# Patient Record
Sex: Female | Born: 1955 | Race: Asian | Hispanic: No | Marital: Married | Smoking: Never smoker
Health system: Southern US, Community
[De-identification: ages and names within clinical notes are randomized; demographics above are authoritative.]

## PROBLEM LIST (undated history)

## (undated) DIAGNOSIS — E785 Hyperlipidemia, unspecified: Secondary | ICD-10-CM

## (undated) DIAGNOSIS — K219 Gastro-esophageal reflux disease without esophagitis: Secondary | ICD-10-CM

## (undated) DIAGNOSIS — D259 Leiomyoma of uterus, unspecified: Secondary | ICD-10-CM

## (undated) DIAGNOSIS — Z860100 Personal history of colon polyps, unspecified: Secondary | ICD-10-CM

## (undated) DIAGNOSIS — K259 Gastric ulcer, unspecified as acute or chronic, without hemorrhage or perforation: Secondary | ICD-10-CM

## (undated) DIAGNOSIS — E041 Nontoxic single thyroid nodule: Secondary | ICD-10-CM

## (undated) DIAGNOSIS — I10 Essential (primary) hypertension: Secondary | ICD-10-CM

## (undated) DIAGNOSIS — M8580 Other specified disorders of bone density and structure, unspecified site: Secondary | ICD-10-CM

## (undated) DIAGNOSIS — F419 Anxiety disorder, unspecified: Secondary | ICD-10-CM

## (undated) DIAGNOSIS — I517 Cardiomegaly: Secondary | ICD-10-CM

## (undated) DIAGNOSIS — G4730 Sleep apnea, unspecified: Secondary | ICD-10-CM

## (undated) DIAGNOSIS — M810 Age-related osteoporosis without current pathological fracture: Secondary | ICD-10-CM

## (undated) DIAGNOSIS — B029 Zoster without complications: Secondary | ICD-10-CM

## (undated) DIAGNOSIS — K2270 Barrett's esophagus without dysplasia: Secondary | ICD-10-CM

## (undated) DIAGNOSIS — IMO0002 Reserved for concepts with insufficient information to code with codable children: Secondary | ICD-10-CM

## (undated) DIAGNOSIS — K8689 Other specified diseases of pancreas: Secondary | ICD-10-CM

## (undated) HISTORY — DX: Gastric ulcer, unspecified as acute or chronic, without hemorrhage or perforation: K25.9

## (undated) HISTORY — DX: Hyperlipidemia, unspecified: E78.5

## (undated) HISTORY — DX: Nontoxic single thyroid nodule: E04.1

## (undated) HISTORY — DX: Barrett's esophagus without dysplasia: K22.70

## (undated) HISTORY — DX: Cardiomegaly: I51.7

## (undated) HISTORY — DX: Age-related osteoporosis without current pathological fracture: M81.0

## (undated) HISTORY — DX: Reserved for concepts with insufficient information to code with codable children: IMO0002

## (undated) HISTORY — DX: Leiomyoma of uterus, unspecified: D25.9

## (undated) HISTORY — DX: Zoster without complications: B02.9

## (undated) HISTORY — DX: Other specified diseases of pancreas: K86.89

## (undated) HISTORY — PX: TUBAL LIGATION: SHX77

## (undated) HISTORY — DX: Essential (primary) hypertension: I10

## (undated) HISTORY — DX: Personal history of colonic polyps: Z86.010

## (undated) HISTORY — PX: COLONOSCOPY W/ POLYPECTOMY: SHX1380

## (undated) HISTORY — DX: Anxiety disorder, unspecified: F41.9

## (undated) HISTORY — DX: Other specified disorders of bone density and structure, unspecified site: M85.80

## (undated) HISTORY — DX: Personal history of colon polyps, unspecified: Z86.0100

## (undated) HISTORY — PX: BIOPSY THYROID: PRO38

## (undated) HISTORY — DX: Sleep apnea, unspecified: G47.30

## (undated) HISTORY — DX: Gastro-esophageal reflux disease without esophagitis: K21.9

---

## 2004-02-08 ENCOUNTER — Other Ambulatory Visit: Admission: RE | Admit: 2004-02-08 | Discharge: 2004-02-08 | Payer: Self-pay | Admitting: Family Medicine

## 2004-03-04 ENCOUNTER — Encounter: Admission: RE | Admit: 2004-03-04 | Discharge: 2004-03-04 | Payer: Self-pay | Admitting: Family Medicine

## 2004-03-04 ENCOUNTER — Encounter: Admission: RE | Admit: 2004-03-04 | Discharge: 2004-03-04 | Payer: Self-pay | Admitting: Internal Medicine

## 2005-03-13 ENCOUNTER — Encounter: Admission: RE | Admit: 2005-03-13 | Discharge: 2005-03-13 | Payer: Self-pay | Admitting: Family Medicine

## 2005-03-19 ENCOUNTER — Encounter: Payer: Self-pay | Admitting: Family Medicine

## 2005-03-19 ENCOUNTER — Other Ambulatory Visit: Admission: RE | Admit: 2005-03-19 | Discharge: 2005-03-19 | Payer: Self-pay | Admitting: Family Medicine

## 2005-03-19 ENCOUNTER — Ambulatory Visit: Payer: Self-pay | Admitting: Family Medicine

## 2005-03-23 ENCOUNTER — Ambulatory Visit: Payer: Self-pay | Admitting: Family Medicine

## 2006-01-20 ENCOUNTER — Ambulatory Visit: Payer: Self-pay | Admitting: Family Medicine

## 2006-03-15 ENCOUNTER — Encounter: Admission: RE | Admit: 2006-03-15 | Discharge: 2006-03-15 | Payer: Self-pay | Admitting: Family Medicine

## 2006-03-19 ENCOUNTER — Ambulatory Visit: Payer: Self-pay | Admitting: Family Medicine

## 2006-03-19 ENCOUNTER — Other Ambulatory Visit: Admission: RE | Admit: 2006-03-19 | Discharge: 2006-03-19 | Payer: Self-pay | Admitting: Family Medicine

## 2006-03-19 ENCOUNTER — Encounter: Payer: Self-pay | Admitting: Family Medicine

## 2006-03-30 ENCOUNTER — Ambulatory Visit: Payer: Self-pay | Admitting: Family Medicine

## 2007-03-21 ENCOUNTER — Encounter: Admission: RE | Admit: 2007-03-21 | Discharge: 2007-03-21 | Payer: Self-pay | Admitting: Family Medicine

## 2007-03-28 ENCOUNTER — Encounter (INDEPENDENT_AMBULATORY_CARE_PROVIDER_SITE_OTHER): Payer: Self-pay | Admitting: *Deleted

## 2007-05-24 ENCOUNTER — Ambulatory Visit: Payer: Self-pay | Admitting: Family Medicine

## 2007-05-24 ENCOUNTER — Encounter: Payer: Self-pay | Admitting: Family Medicine

## 2007-05-24 ENCOUNTER — Other Ambulatory Visit: Admission: RE | Admit: 2007-05-24 | Discharge: 2007-05-24 | Payer: Self-pay | Admitting: Family Medicine

## 2007-05-24 DIAGNOSIS — D259 Leiomyoma of uterus, unspecified: Secondary | ICD-10-CM | POA: Insufficient documentation

## 2007-05-24 DIAGNOSIS — B009 Herpesviral infection, unspecified: Secondary | ICD-10-CM | POA: Insufficient documentation

## 2007-05-24 DIAGNOSIS — R0789 Other chest pain: Secondary | ICD-10-CM | POA: Insufficient documentation

## 2007-05-24 DIAGNOSIS — M79609 Pain in unspecified limb: Secondary | ICD-10-CM | POA: Insufficient documentation

## 2007-05-30 ENCOUNTER — Encounter (INDEPENDENT_AMBULATORY_CARE_PROVIDER_SITE_OTHER): Payer: Self-pay | Admitting: *Deleted

## 2007-05-31 ENCOUNTER — Ambulatory Visit: Payer: Self-pay | Admitting: Family Medicine

## 2007-06-03 ENCOUNTER — Encounter (INDEPENDENT_AMBULATORY_CARE_PROVIDER_SITE_OTHER): Payer: Self-pay | Admitting: *Deleted

## 2007-06-03 LAB — CONVERTED CEMR LAB
ALT: 24 units/L (ref 0–35)
AST: 16 units/L (ref 0–37)
Albumin: 4 g/dL (ref 3.5–5.2)
Alkaline Phosphatase: 64 units/L (ref 39–117)
BUN: 13 mg/dL (ref 6–23)
Basophils Absolute: 0 10*3/uL (ref 0.0–0.1)
Basophils Relative: 0.7 % (ref 0.0–1.0)
Bilirubin, Direct: 0.1 mg/dL (ref 0.0–0.3)
CO2: 30 meq/L (ref 19–32)
Calcium: 9.4 mg/dL (ref 8.4–10.5)
Chloride: 104 meq/L (ref 96–112)
Cholesterol: 167 mg/dL (ref 0–200)
Creatinine, Ser: 0.6 mg/dL (ref 0.4–1.2)
Eosinophils Absolute: 0.2 10*3/uL (ref 0.0–0.6)
Eosinophils Relative: 3.6 % (ref 0.0–5.0)
GFR calc Af Amer: 135 mL/min
GFR calc non Af Amer: 112 mL/min
Glucose, Bld: 88 mg/dL (ref 70–99)
HCT: 39.1 % (ref 36.0–46.0)
HDL: 34.9 mg/dL — ABNORMAL LOW (ref >39.0)
Hemoglobin: 13.6 g/dL (ref 12.0–15.0)
LDL Cholesterol: 100 mg/dL — ABNORMAL HIGH (ref 0–99)
Lymphocytes Relative: 31.2 % (ref 12.0–46.0)
MCHC: 34.7 g/dL (ref 30.0–36.0)
MCV: 90.6 fL (ref 78.0–100.0)
Monocytes Absolute: 0.5 10*3/uL (ref 0.2–0.7)
Monocytes Relative: 7.9 % (ref 3.0–11.0)
Neutro Abs: 3.7 10*3/uL (ref 1.4–7.7)
Neutrophils Relative %: 56.6 % (ref 43.0–77.0)
Platelets: 351 10*3/uL (ref 150–400)
Potassium: 4.6 meq/L (ref 3.5–5.1)
RBC: 4.32 M/uL (ref 3.87–5.11)
RDW: 11.4 % — ABNORMAL LOW (ref 11.5–14.6)
Sodium: 142 meq/L (ref 135–145)
TSH: 2.11 microintl units/mL (ref 0.35–5.50)
Total Bilirubin: 0.9 mg/dL (ref 0.3–1.2)
Total CHOL/HDL Ratio: 4.8
Total Protein: 7.1 g/dL (ref 6.0–8.3)
Triglycerides: 162 mg/dL — ABNORMAL HIGH (ref 0–149)
VLDL: 32 mg/dL (ref 0–40)
WBC: 6.4 10*3/uL (ref 4.5–10.5)

## 2007-06-08 ENCOUNTER — Ambulatory Visit: Payer: Self-pay

## 2007-06-08 ENCOUNTER — Encounter: Payer: Self-pay | Admitting: Family Medicine

## 2007-06-15 ENCOUNTER — Encounter (INDEPENDENT_AMBULATORY_CARE_PROVIDER_SITE_OTHER): Payer: Self-pay | Admitting: *Deleted

## 2007-06-16 ENCOUNTER — Ambulatory Visit: Payer: Self-pay | Admitting: Gastroenterology

## 2008-03-21 ENCOUNTER — Encounter: Admission: RE | Admit: 2008-03-21 | Discharge: 2008-03-21 | Payer: Self-pay | Admitting: Family Medicine

## 2008-03-22 ENCOUNTER — Encounter (INDEPENDENT_AMBULATORY_CARE_PROVIDER_SITE_OTHER): Payer: Self-pay | Admitting: *Deleted

## 2008-05-21 ENCOUNTER — Ambulatory Visit: Payer: Self-pay | Admitting: Family Medicine

## 2008-05-21 ENCOUNTER — Encounter: Payer: Self-pay | Admitting: Family Medicine

## 2008-05-21 ENCOUNTER — Other Ambulatory Visit: Admission: RE | Admit: 2008-05-21 | Discharge: 2008-05-21 | Payer: Self-pay | Admitting: Family Medicine

## 2008-05-23 ENCOUNTER — Encounter (INDEPENDENT_AMBULATORY_CARE_PROVIDER_SITE_OTHER): Payer: Self-pay | Admitting: *Deleted

## 2008-05-29 ENCOUNTER — Encounter (INDEPENDENT_AMBULATORY_CARE_PROVIDER_SITE_OTHER): Payer: Self-pay | Admitting: *Deleted

## 2008-05-29 ENCOUNTER — Ambulatory Visit: Payer: Self-pay | Admitting: Family Medicine

## 2008-06-12 ENCOUNTER — Encounter: Payer: Self-pay | Admitting: Internal Medicine

## 2008-06-23 LAB — CONVERTED CEMR LAB
ALT: 23 units/L (ref 0–35)
AST: 18 units/L (ref 0–37)
Albumin: 3.9 g/dL (ref 3.5–5.2)
Alkaline Phosphatase: 63 units/L (ref 39–117)
BUN: 11 mg/dL (ref 6–23)
Basophils Absolute: 0 10*3/uL (ref 0.0–0.1)
Basophils Relative: 0.4 % (ref 0.0–3.0)
Bilirubin, Direct: 0.1 mg/dL (ref 0.0–0.3)
CO2: 31 meq/L (ref 19–32)
Calcium: 9.3 mg/dL (ref 8.4–10.5)
Chloride: 106 meq/L (ref 96–112)
Cholesterol: 171 mg/dL (ref 0–200)
Creatinine, Ser: 0.7 mg/dL (ref 0.4–1.2)
Direct LDL: 77.3 mg/dL
Eosinophils Absolute: 0.3 10*3/uL (ref 0.0–0.7)
Eosinophils Relative: 3.6 % (ref 0.0–5.0)
GFR calc Af Amer: 113 mL/min
GFR calc non Af Amer: 93 mL/min
Glucose, Bld: 103 mg/dL — ABNORMAL HIGH (ref 70–99)
HCT: 39.3 % (ref 36.0–46.0)
HDL: 30.8 mg/dL — ABNORMAL LOW (ref >39.0)
Hemoglobin: 13.7 g/dL (ref 12.0–15.0)
Lymphocytes Relative: 27.6 % (ref 12.0–46.0)
MCHC: 34.8 g/dL (ref 30.0–36.0)
MCV: 91.5 fL (ref 78.0–100.0)
Monocytes Absolute: 0.5 10*3/uL (ref 0.1–1.0)
Monocytes Relative: 7.3 % (ref 3.0–12.0)
Neutro Abs: 4.3 10*3/uL (ref 1.4–7.7)
Neutrophils Relative %: 61.1 % (ref 43.0–77.0)
Platelets: 256 10*3/uL (ref 150–400)
Potassium: 4 meq/L (ref 3.5–5.1)
RBC: 4.3 M/uL (ref 3.87–5.11)
RDW: 11.4 % — ABNORMAL LOW (ref 11.5–14.6)
Sodium: 142 meq/L (ref 135–145)
TSH: 1.67 microintl units/mL (ref 0.35–5.50)
Total Bilirubin: 0.6 mg/dL (ref 0.3–1.2)
Total CHOL/HDL Ratio: 5.6
Total Protein: 6.8 g/dL (ref 6.0–8.3)
Triglycerides: 202 mg/dL (ref 0–149)
VLDL: 40 mg/dL (ref 0–40)
WBC: 7.1 10*3/uL (ref 4.5–10.5)

## 2008-06-25 ENCOUNTER — Encounter (INDEPENDENT_AMBULATORY_CARE_PROVIDER_SITE_OTHER): Payer: Self-pay | Admitting: *Deleted

## 2008-12-19 ENCOUNTER — Ambulatory Visit: Payer: Self-pay | Admitting: Family Medicine

## 2008-12-19 DIAGNOSIS — R319 Hematuria, unspecified: Secondary | ICD-10-CM | POA: Insufficient documentation

## 2008-12-19 DIAGNOSIS — R109 Unspecified abdominal pain: Secondary | ICD-10-CM | POA: Insufficient documentation

## 2008-12-19 DIAGNOSIS — R1013 Epigastric pain: Secondary | ICD-10-CM | POA: Insufficient documentation

## 2008-12-19 LAB — CONVERTED CEMR LAB
Bilirubin Urine: NEGATIVE
Glucose, Urine, Semiquant: NEGATIVE
Ketones, urine, test strip: NEGATIVE
Nitrite: NEGATIVE
Specific Gravity, Urine: 1.01
Urobilinogen, UA: NEGATIVE
WBC Urine, dipstick: NEGATIVE
pH: 6

## 2008-12-28 LAB — CONVERTED CEMR LAB
ALT: 24 units/L (ref 0–35)
AST: 20 units/L (ref 0–37)
Albumin: 4 g/dL (ref 3.5–5.2)
Alkaline Phosphatase: 71 units/L (ref 39–117)
Amylase: 66 units/L (ref 27–131)
BUN: 15 mg/dL (ref 6–23)
Basophils Absolute: 0 10*3/uL (ref 0.0–0.1)
Basophils Relative: 0.5 % (ref 0.0–3.0)
Bilirubin, Direct: 0 mg/dL (ref 0.0–0.3)
CO2: 28 meq/L (ref 19–32)
Calcium: 9.1 mg/dL (ref 8.4–10.5)
Chloride: 102 meq/L (ref 96–112)
Creatinine, Ser: 0.8 mg/dL (ref 0.4–1.2)
Eosinophils Absolute: 0.2 10*3/uL (ref 0.0–0.7)
Eosinophils Relative: 3.4 % (ref 0.0–5.0)
GFR calc non Af Amer: 79.51 mL/min (ref >60)
Glucose, Bld: 106 mg/dL — ABNORMAL HIGH (ref 70–99)
H Pylori IgG: NEGATIVE
HCT: 39.3 % (ref 36.0–46.0)
Hemoglobin: 13.6 g/dL (ref 12.0–15.0)
Lipase: 39 units/L (ref 11.0–59.0)
Lymphocytes Relative: 24.3 % (ref 12.0–46.0)
Lymphs Abs: 1.7 10*3/uL (ref 0.7–4.0)
MCHC: 34.6 g/dL (ref 30.0–36.0)
MCV: 91.3 fL (ref 78.0–100.0)
Monocytes Absolute: 0.6 10*3/uL (ref 0.1–1.0)
Monocytes Relative: 8.7 % (ref 3.0–12.0)
Neutro Abs: 4.7 10*3/uL (ref 1.4–7.7)
Neutrophils Relative %: 63.1 % (ref 43.0–77.0)
Platelets: 263 10*3/uL (ref 150.0–400.0)
Potassium: 4.3 meq/L (ref 3.5–5.1)
RBC: 4.3 M/uL (ref 3.87–5.11)
RDW: 11.6 % (ref 11.5–14.6)
Sodium: 142 meq/L (ref 135–145)
TSH: 1.7 microintl units/mL (ref 0.35–5.50)
Total Bilirubin: 0.9 mg/dL (ref 0.3–1.2)
Total Protein: 7 g/dL (ref 6.0–8.3)
WBC: 7.2 10*3/uL (ref 4.5–10.5)

## 2008-12-31 ENCOUNTER — Encounter (INDEPENDENT_AMBULATORY_CARE_PROVIDER_SITE_OTHER): Payer: Self-pay | Admitting: *Deleted

## 2009-01-01 ENCOUNTER — Encounter: Admission: RE | Admit: 2009-01-01 | Discharge: 2009-01-01 | Payer: Self-pay | Admitting: Family Medicine

## 2009-01-11 ENCOUNTER — Telehealth: Payer: Self-pay | Admitting: Family Medicine

## 2009-03-22 ENCOUNTER — Encounter: Admission: RE | Admit: 2009-03-22 | Discharge: 2009-03-22 | Payer: Self-pay | Admitting: Family Medicine

## 2009-04-11 ENCOUNTER — Ambulatory Visit: Payer: Self-pay | Admitting: Family Medicine

## 2009-04-11 DIAGNOSIS — K219 Gastro-esophageal reflux disease without esophagitis: Secondary | ICD-10-CM | POA: Insufficient documentation

## 2009-04-12 ENCOUNTER — Encounter (INDEPENDENT_AMBULATORY_CARE_PROVIDER_SITE_OTHER): Payer: Self-pay | Admitting: *Deleted

## 2009-04-12 ENCOUNTER — Telehealth: Payer: Self-pay | Admitting: Family Medicine

## 2009-04-16 ENCOUNTER — Encounter: Payer: Self-pay | Admitting: Family Medicine

## 2009-04-16 LAB — CONVERTED CEMR LAB
ALT: 40 units/L — ABNORMAL HIGH (ref 0–35)
AST: 34 units/L (ref 0–37)
Albumin: 4.3 g/dL (ref 3.5–5.2)
Alkaline Phosphatase: 95 units/L (ref 39–117)
Amylase: 74 units/L (ref 27–131)
BUN: 11 mg/dL (ref 6–23)
Basophils Absolute: 0 10*3/uL (ref 0.0–0.1)
Basophils Relative: 0 % (ref 0.0–3.0)
Bilirubin, Direct: 0 mg/dL (ref 0.0–0.3)
CO2: 18 meq/L — ABNORMAL LOW (ref 19–32)
Calcium: 9.8 mg/dL (ref 8.4–10.5)
Chloride: 100 meq/L (ref 96–112)
Creatinine, Ser: 0.8 mg/dL (ref 0.4–1.2)
Eosinophils Absolute: 0.2 10*3/uL (ref 0.0–0.7)
Eosinophils Relative: 3.7 % (ref 0.0–5.0)
GFR calc non Af Amer: 79.41 mL/min (ref >60)
Glucose, Bld: 107 mg/dL — ABNORMAL HIGH (ref 70–99)
H Pylori IgG: NEGATIVE
HCT: 41.4 % (ref 36.0–46.0)
Hemoglobin: 14.2 g/dL (ref 12.0–15.0)
Lipase: 38 units/L (ref 11.0–59.0)
Lymphocytes Relative: 37.2 % (ref 12.0–46.0)
Lymphs Abs: 2.3 10*3/uL (ref 0.7–4.0)
MCHC: 34.2 g/dL (ref 30.0–36.0)
MCV: 89.7 fL (ref 78.0–100.0)
Monocytes Absolute: 0.3 10*3/uL (ref 0.1–1.0)
Monocytes Relative: 5.2 % (ref 3.0–12.0)
Neutro Abs: 3.5 10*3/uL (ref 1.4–7.7)
Neutrophils Relative %: 53.9 % (ref 43.0–77.0)
Platelets: 280 10*3/uL (ref 150.0–400.0)
Potassium: 4.5 meq/L (ref 3.5–5.1)
RBC: 4.62 M/uL (ref 3.87–5.11)
RDW: 12 % (ref 11.5–14.6)
Sodium: 135 meq/L (ref 135–145)
Total Bilirubin: 0.5 mg/dL (ref 0.3–1.2)
Total Protein: 6.2 g/dL (ref 6.0–8.3)
WBC: 6.3 10*3/uL (ref 4.5–10.5)

## 2009-04-18 ENCOUNTER — Ambulatory Visit: Payer: Self-pay | Admitting: Gastroenterology

## 2009-04-18 DIAGNOSIS — M129 Arthropathy, unspecified: Secondary | ICD-10-CM | POA: Insufficient documentation

## 2009-04-18 DIAGNOSIS — F450 Somatization disorder: Secondary | ICD-10-CM | POA: Insufficient documentation

## 2009-04-18 DIAGNOSIS — R1084 Generalized abdominal pain: Secondary | ICD-10-CM | POA: Insufficient documentation

## 2009-04-18 DIAGNOSIS — E042 Nontoxic multinodular goiter: Secondary | ICD-10-CM | POA: Insufficient documentation

## 2009-04-19 LAB — CONVERTED CEMR LAB
ALT: 27 units/L (ref 0–35)
AST: 17 units/L (ref 0–37)
Albumin: 4.1 g/dL (ref 3.5–5.2)
Alkaline Phosphatase: 83 units/L (ref 39–117)
Amylase: 72 units/L (ref 27–131)
BUN: 16 mg/dL (ref 6–23)
Basophils Absolute: 0.1 10*3/uL (ref 0.0–0.1)
Basophils Relative: 0.9 % (ref 0.0–3.0)
Bilirubin, Direct: 0 mg/dL (ref 0.0–0.3)
CO2: 29 meq/L (ref 19–32)
Calcium: 9 mg/dL (ref 8.4–10.5)
Chloride: 103 meq/L (ref 96–112)
Creatinine, Ser: 0.6 mg/dL (ref 0.4–1.2)
Eosinophils Absolute: 0.2 10*3/uL (ref 0.0–0.7)
Eosinophils Relative: 3.1 % (ref 0.0–5.0)
Ferritin: 97 ng/mL (ref 10.0–291.0)
Folate: 10.8 ng/mL
GFR calc non Af Amer: 110.68 mL/min (ref >60)
Glucose, Bld: 104 mg/dL — ABNORMAL HIGH (ref 70–99)
H Pylori IgG: NEGATIVE
HCT: 39.5 % (ref 36.0–46.0)
Hemoglobin: 13.5 g/dL (ref 12.0–15.0)
Iron: 72 ug/dL (ref 42–145)
Lipase: 31 units/L (ref 11.0–59.0)
Lymphocytes Relative: 29 % (ref 12.0–46.0)
Lymphs Abs: 2 10*3/uL (ref 0.7–4.0)
MCHC: 34.2 g/dL (ref 30.0–36.0)
MCV: 90.6 fL (ref 78.0–100.0)
Monocytes Absolute: 0.5 10*3/uL (ref 0.1–1.0)
Monocytes Relative: 6.7 % (ref 3.0–12.0)
Neutro Abs: 4.1 10*3/uL (ref 1.4–7.7)
Neutrophils Relative %: 60.3 % (ref 43.0–77.0)
Platelets: 257 10*3/uL (ref 150.0–400.0)
Potassium: 3.8 meq/L (ref 3.5–5.1)
RBC: 4.36 M/uL (ref 3.87–5.11)
RDW: 11.8 % (ref 11.5–14.6)
Saturation Ratios: 22.8 % (ref 20.0–50.0)
Sed Rate: 5 mm/hr (ref 0–22)
Sodium: 141 meq/L (ref 135–145)
T3 Uptake Ratio: 41 % — ABNORMAL HIGH (ref 22.5–37.0)
T4, Total: 8.9 ug/dL (ref 5.0–12.5)
TSH: 1.25 microintl units/mL (ref 0.35–5.50)
Total Bilirubin: 0.7 mg/dL (ref 0.3–1.2)
Total Protein: 6.9 g/dL (ref 6.0–8.3)
Transferrin: 225.4 mg/dL (ref 212.0–360.0)
Vitamin B-12: 447 pg/mL (ref 211–911)
WBC: 6.9 10*3/uL (ref 4.5–10.5)

## 2009-04-25 ENCOUNTER — Encounter: Admission: RE | Admit: 2009-04-25 | Discharge: 2009-04-25 | Payer: Self-pay | Admitting: Family Medicine

## 2009-04-26 ENCOUNTER — Telehealth: Payer: Self-pay | Admitting: Family Medicine

## 2009-04-26 ENCOUNTER — Telehealth (INDEPENDENT_AMBULATORY_CARE_PROVIDER_SITE_OTHER): Payer: Self-pay | Admitting: *Deleted

## 2009-04-29 ENCOUNTER — Telehealth: Payer: Self-pay | Admitting: Family Medicine

## 2009-06-11 ENCOUNTER — Other Ambulatory Visit: Admission: RE | Admit: 2009-06-11 | Discharge: 2009-06-11 | Payer: Self-pay | Admitting: Interventional Radiology

## 2009-06-11 ENCOUNTER — Encounter: Payer: Self-pay | Admitting: Family Medicine

## 2009-06-11 ENCOUNTER — Encounter: Admission: RE | Admit: 2009-06-11 | Discharge: 2009-06-11 | Payer: Self-pay | Admitting: Surgery

## 2010-04-23 ENCOUNTER — Encounter: Admission: RE | Admit: 2010-04-23 | Discharge: 2010-04-23 | Payer: Self-pay | Admitting: Internal Medicine

## 2010-07-26 ENCOUNTER — Encounter: Payer: Self-pay | Admitting: Family Medicine

## 2010-07-27 ENCOUNTER — Encounter: Payer: Self-pay | Admitting: Family Medicine

## 2010-08-03 LAB — CONVERTED CEMR LAB
Bilirubin Urine: NEGATIVE
Glucose, Urine, Semiquant: NEGATIVE
Ketones, urine, test strip: NEGATIVE
Nitrite: NEGATIVE
Pap Smear: NORMAL
Protein, U semiquant: NEGATIVE
Specific Gravity, Urine: 1.02
Urobilinogen, UA: NEGATIVE
WBC Urine, dipstick: NEGATIVE
pH: 6

## 2011-05-07 ENCOUNTER — Other Ambulatory Visit: Payer: Self-pay | Admitting: Internal Medicine

## 2011-05-07 DIAGNOSIS — Z1231 Encounter for screening mammogram for malignant neoplasm of breast: Secondary | ICD-10-CM

## 2011-06-03 ENCOUNTER — Ambulatory Visit
Admission: RE | Admit: 2011-06-03 | Discharge: 2011-06-03 | Disposition: A | Discharge status: Home / Self Care | Payer: BC Managed Care – PPO | Source: Ambulatory Visit | Attending: Internal Medicine | Admitting: Internal Medicine

## 2011-06-03 DIAGNOSIS — Z1231 Encounter for screening mammogram for malignant neoplasm of breast: Secondary | ICD-10-CM

## 2014-05-02 ENCOUNTER — Telehealth: Payer: Self-pay | Admitting: Gynecology

## 2014-05-02 NOTE — Telephone Encounter (Signed)
LMTCB about canceled appointment with Dr. Charlies Constable. I will close the encounter.

## 2014-05-21 ENCOUNTER — Encounter: Payer: Self-pay | Admitting: Gynecology

## 2014-06-14 ENCOUNTER — Encounter: Payer: Self-pay | Admitting: Certified Nurse Midwife

## 2014-06-14 ENCOUNTER — Ambulatory Visit (INDEPENDENT_AMBULATORY_CARE_PROVIDER_SITE_OTHER): Payer: BC Managed Care – PPO | Admitting: Certified Nurse Midwife

## 2014-06-14 VITALS — BP 110/80 | HR 72 | Resp 16 | Ht 60.25 in | Wt 143.0 lb

## 2014-06-14 DIAGNOSIS — N852 Hypertrophy of uterus: Secondary | ICD-10-CM

## 2014-06-14 DIAGNOSIS — D259 Leiomyoma of uterus, unspecified: Secondary | ICD-10-CM

## 2014-06-14 NOTE — Patient Instructions (Signed)
Uterine Fibroid A uterine fibroid is a growth (tumor) in your uterus. It is not cancerous. Some fibroids cause pain or heavy bleeding during and in between periods. Moore care depends on how you were treated. In general:  Keep all doctor visits.  Only take medicine as told by your doctor. Do not take aspirin.  Talk to your doctor about taking iron pills.  If you have painful periods that are not heavy, lie down with your feet up. Put cold packs on your belly (abdomen).  If you have heavy periods, tell your doctor how many pads or tampons you use in a month.  Eat green vegetables. GET HELP RIGHT AWAY IF:   You have lower belly (pelvic) pain or cramps not helped by medicine.  You have sudden lower belly pain that gets worse.  You have more bleeding between and during your periods.  If you soak your tampon or pad in 30 minutes or less.  You feel lightheaded or faint. MAKE SURE YOU:   Understand these instructions.  Will watch your condition.  Will get help right away if you are not doing well or get worse. Document Released: 09/18/2008 Document Revised: 04/12/2013 Document Reviewed: 01/19/2013 [facility] Patient Information 2015 [place], [place]. This information is not intended to replace advice given to you by your health care provider. Make sure you discuss any questions you have with your health care provider.

## 2014-06-14 NOTE — Progress Notes (Signed)
58 y.o. G2P2003 Married Asian [place] new patient, here for consultation and pelvic exam  regarding uterus enlargement and history of fibroids.  Was seen at [facility] with enlarged uterus and  was told she needed further evaluation. No PUS report here with records. Patient says she had one done in 11/15. Menopausal no vaginal bleeding or vaginal dryness. Using Premarin cream for vaginal dryness every night. Initially and then sporadic to none. Patient denies pelvic pain, urinary symptoms or other health issues. Patient "just wants to make sure enlarged uterus is not a problem." PUS from 2010 in epic identified with uterus size 18.4x 11.5.x 9.5 cm. Multiple fibroids were noted wit the largest being 10.4 x 9.2 x 7.6 cm with submucosal component. Normal appearing ovaries transabdominal. Patient does not feel anything has changed. Patient is having pain with sexual activity with dryness, no bleeding. No other concerns.   Patient's last menstrual period was 07/06/2008.          Sexually active: Yes.    The current method of family planning is tubal ligation.    Exercising: No.  exercise Smoker:  no  Health Maintenance: Pap:  2015 negative per patient MMG:  2013 per patient Colonoscopy:  2013 BMD:   2013 TDaP:  Will check with pcp ws due 2005, but patient feels UTD Labs: none Self breast exam: done monthly   reports that she has never smoked. She does not have any smokeless tobacco history on file. She reports that she does not drink alcohol or use illicit drugs.  Past Medical History  Diagnosis Date  . Uterine leiomyoma   . Sleep apnea   . Gastric ulcer   . Barrett's esophagus   . Cardiomegaly   . Hyperlipidemia   . Hypertension   . Hx of colonic polyps   . Thyroid nodule   . Osteopenia   . Anxiety   . Dyspareunia   . Shingles   . GERD (gastroesophageal reflux disease)   . Fatty pancreas     Past Surgical History  Procedure Laterality Date  . Colonoscopy w/ polypectomy    .  Biopsy thyroid    . Tubal ligation      Current Outpatient Prescriptions  Medication Sig Dispense Refill  . CRESTOR 20 MG tablet Take 20 mg by mouth at bedtime.  12  . losartan (COZAAR) 25 MG tablet Take 25 mg by mouth daily.  12   No current facility-administered medications for this visit.    History reviewed. No pertinent family history.  ROS:  Pertinent items are noted in HPI.  Otherwise, a comprehensive ROS was negative.  Exam:   BP 110/80 mmHg  Pulse 72  Resp 16  Ht 5' 0.25" (1.53 m)  Wt 143 lb (64.864 kg)  BMI 27.71 kg/m2  LMP 07/06/2008 Height: 5' 0.25" (153 cm)  Ht Readings from Last 3 Encounters:  06/14/14 5' 0.25" (1.53 m)  04/18/09 5\' 1"  (1.549 m)  12/19/08 5\' 1"  (1.549 m)    General appearance: alert, cooperative and appears stated age, communicates well with good understanding. Head: Normocephalic, without obvious abnormality, atraumatic Abdomen: soft, non-tender;mass noted on right firm Skin: Skin color, texture, turgor normal. No rashes or lesions Lymph nodes:  normal. No abnormal inguinal nodes palpated   Pelvic: External genitalia:  no lesions              Urethra:  normal appearing urethra with no masses, tenderness or lesions  Bartholin's and Skene's: normal                 Vagina: normal appearing vagina with normal color and scant moisture, no lesions              Cervix:slightly firm mid position, normal appearance              Bimanual Exam:  Uterus:  enlarged, 18- 20  weeks size and firm enlarged more on right than left, non tender              Adnexa: not palpable adnexa, mass noted on left and firm, probable fibroid non tender               Rectovaginal: Confirms               Anus:  normal appearance  A:  Enlarged uterus history of fibroids per patient and 2010 PUS, current PUS not available, record requested  Vaginal dryness with sporadic use of Premarin cream, patient prefers no hormone use  Current aex with pap per patient,  not noted in records here. P:   Reviewed with patient abdominal and pelvic exam findings of enlarged uterus consistent with her previous history.? Questionable size change, will assess when record obtained. Discussed symptoms that may indicate with urination or pressure and patient denies these. Discussed fibroids can decrease in size in menopause. Discussed importance of advising if vaginal bleeding. Patient may change care to here, but not sure. If no PUS obtained would need to repeat for assessment, patient agreeable. Discussed Olive oil or coconut oil use for vaginal dryness at hs and with sexual activity. Questions addressed.  Rv as above. An After Visit Summary was printed and given to the patient.

## 2014-06-15 NOTE — Progress Notes (Addendum)
If current ultrasound shows stability in size, agree with continued monitoring as long as pt comfortable.  Reviewed personally.  Patty Emory, MD.  Addendum: Record of US shows from [facility] enlarged uterus but no conclusion. Patient will be notified and PUS scheduled here for evaluation of enlarged uterus on exam.

## 2014-06-19 ENCOUNTER — Telehealth: Payer: Self-pay

## 2014-06-19 DIAGNOSIS — N852 Hypertrophy of uterus: Secondary | ICD-10-CM

## 2014-06-19 NOTE — Addendum Note (Signed)
Addended by: Regina Eck on: 06/19/2014 08:47 AM   Modules accepted: Orders, SmartSet

## 2014-06-19 NOTE — Telephone Encounter (Signed)
Spoke with patient. Advised patient Patty [name] CNM has received radiology report from [facility] and recommends that she be seen in our office for PUS and consult with MD due to enlargement of uterus. Patient states that she is at the gym and would like to call back to discuss. Radiology report from [facility] sent to scan.

## 2014-06-27 NOTE — Telephone Encounter (Signed)
Left message to call Patty [name] at [phone]. 

## 2014-07-02 NOTE — Telephone Encounter (Signed)
Spoke with patient. Advised patient of message from [place]. Patient is agreeable to have PUS done in our office but would like to make sure it is covered with insurance before scheduling. Advised patient our insurance and billing department will have to check this and will return call with coverage information. Will schedule appointment from there. Patient is agreeable.  Cc: Felipa Emory

## 2014-07-03 NOTE — Telephone Encounter (Signed)
Per [facility] w/ [employer] 2016 benefit package is not yet loaded

## 2014-07-13 NOTE — Telephone Encounter (Signed)
Spoke with patient. Advised of OOP cost for ultrasound being $25 per note seen below from [place]. Patient would like to schedule at this time. Appointment scheduled for 1/14 at 2:30pm with 3pm consult with Dr.Miller. Patient is agreeable to date and time.  Cc: Dr.Miller  Routing to provider for final review. Patient agreeable to disposition. Will close encounter

## 2014-07-13 NOTE — Telephone Encounter (Signed)
Pr $25

## 2014-07-19 ENCOUNTER — Other Ambulatory Visit: Payer: Self-pay | Admitting: Obstetrics & Gynecology

## 2014-07-19 ENCOUNTER — Other Ambulatory Visit: Payer: Self-pay

## 2014-07-19 ENCOUNTER — Telehealth: Payer: Self-pay | Admitting: Obstetrics & Gynecology

## 2014-07-19 NOTE — Telephone Encounter (Signed)
Called patient and left messages at both numbers cancelling her PUS for today due to the sonographer having a death in the family. Please call her to reschedule.

## 2014-07-20 NOTE — Telephone Encounter (Signed)
Spoke with patient. Rescheduled PUS to 01.28.2016.

## 2014-08-02 ENCOUNTER — Ambulatory Visit (INDEPENDENT_AMBULATORY_CARE_PROVIDER_SITE_OTHER): Payer: BLUE CROSS/BLUE SHIELD | Admitting: Obstetrics & Gynecology

## 2014-08-02 ENCOUNTER — Encounter: Payer: Self-pay | Admitting: Obstetrics & Gynecology

## 2014-08-02 ENCOUNTER — Ambulatory Visit (INDEPENDENT_AMBULATORY_CARE_PROVIDER_SITE_OTHER): Payer: BLUE CROSS/BLUE SHIELD

## 2014-08-02 VITALS — BP 122/84 | Ht 60.25 in | Wt 146.0 lb

## 2014-08-02 DIAGNOSIS — D250 Submucous leiomyoma of uterus: Secondary | ICD-10-CM

## 2014-08-02 DIAGNOSIS — D251 Intramural leiomyoma of uterus: Secondary | ICD-10-CM

## 2014-08-02 DIAGNOSIS — N852 Hypertrophy of uterus: Secondary | ICD-10-CM

## 2014-08-02 NOTE — Progress Notes (Signed)
59 y.o. Patty [name] here for a pelvic ultrasound due to fibroids noted on physical exam from PCP, Dr. Glendon Axe at [facility].  Pt denies vaginal bleeding.  Has known for years that she had fibroids.  Was told in [place] years ago if she wasn't having heavy bleeding, she didn't need a hysterectomy.  Denies pelvic pain, rectal pressure, bladder pressure.  Review of medical records shows a prior ultrasound in 2010.  Comparison made to measurements today and this was all reviewed with pt.   Patient's last menstrual period was 07/06/2008.  Sexually active:  yes  Contraception: bilateral tubal ligation  FINDINGS: UTERUS:  9.0 x 8.2 x 7.8cm with multiple fibroids.  Largest 7.7 cm.  Other fibroids measure 3.0, 2.3, and 4.0cm EMS: 3.7mm ADNEXA:   Left ovary 2.1 x 2.1 x 1.5cm   Right ovary 2.2 x 1.4 x 0.8cm CUL DE SAC: no free fluid noted.  Comparison made to prior u/s obtained 01/01/09 which pt reports was performed due to large uterus and fibroids.  Images and report reviewed.  Uterus was 18 x 11 x 9.5cm with 10.4cm fibroid and then at least two additional fibroids 4.5 and 1.8cm.   Endometrial biopsy recommended.  Discussed with patient.  Verbal and written consent obtained.    Uterus and fibroids are definitely small now.  Given pt is otherwise asymptomatic, I do not recommend surgical removal.  Pt understands and any change in symptoms, specifically if she starts to feel pressure or has vaginal bleeding, reevaluation is appropriate.  Pt voices understanding.  All questions answered.  Assessment:  Enlarged fibroid uterus that is decreased in size since 2010  Plan:  Pt will follow-up prn or is welcome to continue annual gynecologic exams here.  Discouraged estrogen use as may make fibroids change in size.   CC: Dr. Glendon Axe  ~15 minutes spent with patient >50% of time was in face to face discussion of above.

## 2015-01-14 ENCOUNTER — Other Ambulatory Visit: Payer: Self-pay

## 2015-01-14 DIAGNOSIS — Z1231 Encounter for screening mammogram for malignant neoplasm of breast: Secondary | ICD-10-CM

## 2015-01-25 ENCOUNTER — Ambulatory Visit
Admission: RE | Admit: 2015-01-25 | Discharge: 2015-01-25 | Disposition: A | Discharge status: Home / Self Care | Payer: BLUE CROSS/BLUE SHIELD | Source: Ambulatory Visit

## 2015-01-25 DIAGNOSIS — Z1231 Encounter for screening mammogram for malignant neoplasm of breast: Secondary | ICD-10-CM

## 2015-01-29 ENCOUNTER — Other Ambulatory Visit: Payer: Self-pay | Admitting: Family Medicine

## 2015-01-29 DIAGNOSIS — R928 Other abnormal and inconclusive findings on diagnostic imaging of breast: Secondary | ICD-10-CM

## 2015-02-01 ENCOUNTER — Ambulatory Visit
Admission: RE | Admit: 2015-02-01 | Discharge: 2015-02-01 | Disposition: A | Discharge status: Home / Self Care | Payer: BLUE CROSS/BLUE SHIELD | Source: Ambulatory Visit | Attending: Family Medicine | Admitting: Family Medicine

## 2015-02-01 DIAGNOSIS — R928 Other abnormal and inconclusive findings on diagnostic imaging of breast: Secondary | ICD-10-CM

## 2020-03-05 ENCOUNTER — Inpatient Hospital Stay (HOSPITAL_BASED_OUTPATIENT_CLINIC_OR_DEPARTMENT_OTHER)
Admission: EM | Admit: 2020-03-05 | Discharge: 2020-03-08 | DRG: 177 | Disposition: A | Discharge status: Home Health | Payer: Medicare Other | Attending: Internal Medicine | Admitting: Internal Medicine

## 2020-03-05 ENCOUNTER — Other Ambulatory Visit: Payer: Self-pay

## 2020-03-05 ENCOUNTER — Encounter (HOSPITAL_BASED_OUTPATIENT_CLINIC_OR_DEPARTMENT_OTHER): Payer: Self-pay | Admitting: *Deleted

## 2020-03-05 ENCOUNTER — Emergency Department (HOSPITAL_BASED_OUTPATIENT_CLINIC_OR_DEPARTMENT_OTHER): Payer: Medicare Other

## 2020-03-05 ENCOUNTER — Encounter (HOSPITAL_BASED_OUTPATIENT_CLINIC_OR_DEPARTMENT_OTHER): Payer: Self-pay

## 2020-03-05 ENCOUNTER — Emergency Department (HOSPITAL_BASED_OUTPATIENT_CLINIC_OR_DEPARTMENT_OTHER)
Admission: EM | Admit: 2020-03-05 | Discharge: 2020-03-05 | Discharge status: Against Medical Advice | Payer: Medicare Other | Source: Home / Self Care

## 2020-03-05 DIAGNOSIS — R05 Cough: Secondary | ICD-10-CM | POA: Insufficient documentation

## 2020-03-05 DIAGNOSIS — R197 Diarrhea, unspecified: Secondary | ICD-10-CM | POA: Diagnosis present

## 2020-03-05 DIAGNOSIS — K2270 Barrett's esophagus without dysplasia: Secondary | ICD-10-CM | POA: Diagnosis present

## 2020-03-05 DIAGNOSIS — K219 Gastro-esophageal reflux disease without esophagitis: Secondary | ICD-10-CM | POA: Diagnosis present

## 2020-03-05 DIAGNOSIS — Z9981 Dependence on supplemental oxygen: Secondary | ICD-10-CM

## 2020-03-05 DIAGNOSIS — I10 Essential (primary) hypertension: Secondary | ICD-10-CM | POA: Diagnosis present

## 2020-03-05 DIAGNOSIS — Z8249 Family history of ischemic heart disease and other diseases of the circulatory system: Secondary | ICD-10-CM

## 2020-03-05 DIAGNOSIS — M810 Age-related osteoporosis without current pathological fracture: Secondary | ICD-10-CM | POA: Diagnosis present

## 2020-03-05 DIAGNOSIS — F419 Anxiety disorder, unspecified: Secondary | ICD-10-CM | POA: Diagnosis present

## 2020-03-05 DIAGNOSIS — G4733 Obstructive sleep apnea (adult) (pediatric): Secondary | ICD-10-CM | POA: Diagnosis present

## 2020-03-05 DIAGNOSIS — J1282 Pneumonia due to coronavirus disease 2019: Secondary | ICD-10-CM | POA: Diagnosis present

## 2020-03-05 DIAGNOSIS — R0602 Shortness of breath: Secondary | ICD-10-CM | POA: Insufficient documentation

## 2020-03-05 DIAGNOSIS — M1990 Unspecified osteoarthritis, unspecified site: Secondary | ICD-10-CM | POA: Diagnosis present

## 2020-03-05 DIAGNOSIS — J9601 Acute respiratory failure with hypoxia: Secondary | ICD-10-CM | POA: Diagnosis present

## 2020-03-05 DIAGNOSIS — D696 Thrombocytopenia, unspecified: Secondary | ICD-10-CM | POA: Diagnosis present

## 2020-03-05 DIAGNOSIS — U071 COVID-19: Secondary | ICD-10-CM | POA: Diagnosis not present

## 2020-03-05 DIAGNOSIS — Z5321 Procedure and treatment not carried out due to patient leaving prior to being seen by health care provider: Secondary | ICD-10-CM | POA: Insufficient documentation

## 2020-03-05 DIAGNOSIS — J9600 Acute respiratory failure, unspecified whether with hypoxia or hypercapnia: Secondary | ICD-10-CM | POA: Diagnosis present

## 2020-03-05 DIAGNOSIS — R109 Unspecified abdominal pain: Secondary | ICD-10-CM | POA: Insufficient documentation

## 2020-03-05 DIAGNOSIS — E785 Hyperlipidemia, unspecified: Secondary | ICD-10-CM | POA: Diagnosis present

## 2020-03-05 DIAGNOSIS — R42 Dizziness and giddiness: Secondary | ICD-10-CM | POA: Diagnosis present

## 2020-03-05 LAB — COMPREHENSIVE METABOLIC PANEL
ALT: 35 U/L (ref 0–44)
AST: 39 U/L (ref 15–41)
Albumin: 3.6 g/dL (ref 3.5–5.0)
Alkaline Phosphatase: 100 U/L (ref 38–126)
Anion gap: 12 (ref 5–15)
BUN: 10 mg/dL (ref 8–23)
CO2: 24 mmol/L (ref 22–32)
Calcium: 8.8 mg/dL — ABNORMAL LOW (ref 8.9–10.3)
Chloride: 99 mmol/L (ref 98–111)
Creatinine, Ser: 0.78 mg/dL (ref 0.44–1.00)
GFR calc Af Amer: >60 mL/min (ref >60)
GFR calc non Af Amer: >60 mL/min (ref >60)
Glucose, Bld: 137 mg/dL — ABNORMAL HIGH (ref 70–99)
Potassium: 3.7 mmol/L (ref 3.5–5.1)
Sodium: 135 mmol/L (ref 135–145)
Total Bilirubin: 0.6 mg/dL (ref 0.3–1.2)
Total Protein: 6.7 g/dL (ref 6.5–8.1)

## 2020-03-05 LAB — CBC WITH DIFFERENTIAL/PLATELET
Abs Immature Granulocytes: 0.02 10*3/uL (ref 0.00–0.07)
Basophils Absolute: 0 10*3/uL (ref 0.0–0.1)
Basophils Relative: 0 %
Eosinophils Absolute: 0 10*3/uL (ref 0.0–0.5)
Eosinophils Relative: 0 %
HCT: 40.2 % (ref 36.0–46.0)
Hemoglobin: 13.3 g/dL (ref 12.0–15.0)
Immature Granulocytes: 0 %
Lymphocytes Relative: 15 %
Lymphs Abs: 0.9 10*3/uL (ref 0.7–4.0)
MCH: 29.3 pg (ref 26.0–34.0)
MCHC: 33.1 g/dL (ref 30.0–36.0)
MCV: 88.5 fL (ref 80.0–100.0)
Monocytes Absolute: 0.3 10*3/uL (ref 0.1–1.0)
Monocytes Relative: 5 %
Neutro Abs: 4.5 10*3/uL (ref 1.7–7.7)
Neutrophils Relative %: 80 %
Platelets: 121 10*3/uL — ABNORMAL LOW (ref 150–400)
RBC: 4.54 MIL/uL (ref 3.87–5.11)
RDW: 12.5 % (ref 11.5–15.5)
WBC: 5.7 10*3/uL (ref 4.0–10.5)
nRBC: 0 % (ref 0.0–0.2)

## 2020-03-05 LAB — D-DIMER, QUANTITATIVE: D-Dimer, Quant: 0.43 ug/mL-FEU (ref 0.00–0.50)

## 2020-03-05 LAB — LACTIC ACID, PLASMA: Lactic Acid, Venous: 1.1 mmol/L (ref 0.5–1.9)

## 2020-03-05 LAB — C-REACTIVE PROTEIN: CRP: 10.7 mg/dL — ABNORMAL HIGH (ref <1.0)

## 2020-03-05 LAB — TRIGLYCERIDES: Triglycerides: 128 mg/dL (ref <150)

## 2020-03-05 LAB — LACTATE DEHYDROGENASE: LDH: 340 U/L — ABNORMAL HIGH (ref 98–192)

## 2020-03-05 LAB — FERRITIN: Ferritin: 703 ng/mL — ABNORMAL HIGH (ref 11–307)

## 2020-03-05 LAB — FIBRINOGEN: Fibrinogen: 624 mg/dL — ABNORMAL HIGH (ref 210–475)

## 2020-03-05 MED ORDER — SODIUM CHLORIDE 0.9 % IV SOLN
100.0000 mg | INTRAVENOUS | Status: AC
  Administered 2020-03-05 (×2): 100 mg via INTRAVENOUS
  Filled 2020-03-05 (×2): qty 20

## 2020-03-05 MED ORDER — ACETAMINOPHEN 325 MG PO TABS
650.0000 mg | ORAL_TABLET | Freq: Once | ORAL | Status: AC
  Administered 2020-03-05: 650 mg via ORAL
  Filled 2020-03-05: qty 2

## 2020-03-05 MED ORDER — METHYLPREDNISOLONE SODIUM SUCC 125 MG IJ SOLR
60.0000 mg | Freq: Once | INTRAMUSCULAR | Status: AC
  Administered 2020-03-05: 60 mg via INTRAVENOUS
  Filled 2020-03-05: qty 2

## 2020-03-05 MED ORDER — SODIUM CHLORIDE 0.9 % IV SOLN
100.0000 mg | Freq: Every day | INTRAVENOUS | Status: DC
  Administered 2020-03-06 – 2020-03-08 (×3): 100 mg via INTRAVENOUS
  Filled 2020-03-05 (×3): qty 20

## 2020-03-05 NOTE — ED Triage Notes (Signed)
Pt dx with covid 8/27, cont with cough and sob, triaged to ed earlier, states she got cold so she went home to get warm. Denies any new c/o states "I got warmed up so I came back."

## 2020-03-05 NOTE — ED Triage Notes (Signed)
Pt c/o cont'd cough, SOB x 2 days-states she had +covid 8/27-states she did get covid vaccine-NAD-steady gait

## 2020-03-05 NOTE — ED Provider Notes (Signed)
[facility] EMERGENCY DEPARTMENT Provider Note   CSN: [id number] Arrival date & time: 03/05/20  1715    History Chief Complaint  Patient presents with  . Cough    Patty [name] is a 64 y.o. female  Sx x 1 week, day 7 illness  Diarrhea today, no melena or BRPPR, No abd pain Cough non productive, SOB with exertion, increased WOB with speaking, No CP, palpitations, No unilateral leg swelling redness or warmth. Fever at home temp max 103.5 orally. No hx of PE, DVT, clotting disorders  Tolerating PO intake however decreased appetite, some nasuea  Taking Tylenol, Ibuprofen Loss of taste  J&J vaccine in April  Exposed by Positive  husband positive 2 weeks ago  Positive COVID 02/28/20, received e-mail yesterday from [facility] See picture in chart for documentation  History obtained from patient and past medical records.  No interpreter is used.  HPI     Past Medical History:  Diagnosis Date  . Anxiety   . Barrett's esophagus   . Cardiomegaly   . Dyspareunia   . Fatty pancreas   . Gastric ulcer   . GERD (gastroesophageal reflux disease)   . Hx of colonic polyps   . Hyperlipidemia   . Hypertension   . Osteopenia   . Osteoporosis   . Shingles   . Sleep apnea   . Thyroid nodule   . Uterine leiomyoma     Patient Active Problem List   Diagnosis Date Noted  . Acute respiratory failure due to COVID-19 ([place]) 03/05/2020  . 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 ARTHRITIS 04/18/2009  . ABDOMINAL PAIN, GENERALIZED 04/18/2009  . GERD 04/11/2009  . HEMATURIA UNSPECIFIED 12/19/2008  . ABDOMINAL PAIN, EPIGASTRIC 12/19/2008  . ABDOMINAL PAIN, SUPRAPUBIC 12/19/2008  . COLD SORE 05/24/2007  . FIBROIDS, UTERUS 05/24/2007  . FOOT PAIN, LEFT 05/24/2007  . CHEST PAIN, ATYPICAL 05/24/2007    Past Surgical History:  Procedure Laterality Date  . BIOPSY THYROID    . COLONOSCOPY W/ POLYPECTOMY    . TUBAL LIGATION       OB  History    Gravida  2   Para  2   Term  2   Preterm      AB      Living  2     SAB      TAB      Ectopic      Multiple      Live Births  2           Family History  Problem Relation Age of Onset  . Hypertension Mother   . Heart disease Mother   . Heart disease Father   . Hypertension Brother     Social History   Tobacco Use  . Smoking status: Never Smoker  . Smokeless tobacco: Never Used  Vaping Use  . Vaping Use: Never used  Substance Use Topics  . Alcohol use: No    Alcohol/week: 0.0 standard drinks  . Drug use: No    Home Medications Prior to Admission medications   Medication Sig Start Date End Date Taking? Authorizing Provider  COVID-19 Specimen Collection KIT SWAB AS DIRECTED 03/01/20   [provider]  CRESTOR 20 MG tablet Take 20 mg by mouth at bedtime. 04/06/14   [provider]  losartan (COZAAR) 25 MG tablet Take 25 mg by mouth daily. 04/10/14   [provider]    Allergies    Patient has no  known allergies.  Review of Systems   Review of Systems  Constitutional: Positive for activity change, appetite change, chills, fatigue and fever.  Respiratory: Positive for cough and shortness of breath. Negative for apnea, choking, chest tightness, wheezing and stridor.   Cardiovascular: Negative.   Gastrointestinal: Positive for abdominal pain and diarrhea. Negative for abdominal distention, anal bleeding, blood in stool, constipation, nausea, rectal pain and vomiting.  Genitourinary: Negative.   Musculoskeletal: Positive for myalgias.  Skin: Negative.   Neurological: Positive for weakness (Generalized) and headaches. Negative for dizziness, tremors, seizures, syncope, facial asymmetry, speech difficulty, light-headedness and numbness.  All other systems reviewed and are negative.  Physical Exam Updated Vital Signs BP 126/71 (BP Location: Left Arm)   Pulse 82   Temp 99.2 F (37.3 C) (Oral)   Resp (!) 31   LMP  07/06/2008   SpO2 91%   Physical Exam Vitals and nursing note reviewed.  Constitutional:      General: She is not in acute distress.    Appearance: She is not ill-appearing, toxic-appearing or diaphoretic.  HENT:     Head: Normocephalic and atraumatic.     Jaw: There is normal jaw occlusion.     Right Ear: Tympanic membrane, ear canal and external ear normal. There is no impacted cerumen. No hemotympanum. Tympanic membrane is not injected, scarred, perforated, erythematous, retracted or bulging.     Left Ear: Tympanic membrane, ear canal and external ear normal. There is no impacted cerumen. No hemotympanum. Tympanic membrane is not injected, scarred, perforated, erythematous, retracted or bulging.     Ears:     Comments: No Mastoid tenderness.    Nose:     Comments: Clear rhinorrhea and congestion to bilateral nares.  No sinus tenderness.    Mouth/Throat:     Comments: Posterior oropharynx clear.  Mucous membranes moist.  Tonsils without erythema or exudate.  Uvula midline without deviation.  No evidence of PTA or RPA.  No drooling, dysphasia or trismus.  Phonation normal. Neck:     Trachea: Trachea and phonation normal.     Meningeal: Brudzinski's sign and Kernig's sign absent.     Comments: No Neck stiffness or neck rigidity.  No meningismus.  No cervical lymphadenopathy. Cardiovascular:     Rate and Rhythm: Tachycardia present.     Pulses: Normal pulses.          Radial pulses are 2+ on the right side and 2+ on the left side.     Heart sounds: Normal heart sounds.     Comments: No murmurs rubs or gallops. HR 100 Pulmonary:     Effort: Tachypnea present.     Breath sounds: Normal breath sounds.     Comments: Clear to auscultation bilaterally without wheeze, rhonchi or rales.  No accessory muscle usage. Speaks in short sentences. RR 38 on RA Abdominal:     Comments: Soft, nontender without rebound or guarding.  No CVA tenderness.  Musculoskeletal:     Comments: Moves all 4  extremities without difficulty.  Lower extremities without edema, erythema or warmth.  Skin:    Comments: Brisk capillary refill.  No rashes or lesions.  Neurological:     Mental Status: She is alert.     Comments: Ambulatory in department without difficulty.  Cranial nerves II through XII grossly intact.  No facial droop.  No aphasia.    ED Results / Procedures / Treatments   Labs (all labs ordered are listed, but only abnormal results are displayed) Labs Reviewed  CBC WITH DIFFERENTIAL/PLATELET - Abnormal; Notable for the following components:      Result Value   Platelets 121 (*)    All other components within normal limits  COMPREHENSIVE METABOLIC PANEL - Abnormal; Notable for the following components:   Glucose, Bld 137 (*)    Calcium 8.8 (*)    All other components within normal limits  CULTURE, BLOOD (ROUTINE X 2)  CULTURE, BLOOD (ROUTINE X 2)  LACTIC ACID, PLASMA  D-DIMER, QUANTITATIVE (NOT AT [facility])  LACTIC ACID, PLASMA  PROCALCITONIN  LACTATE DEHYDROGENASE  FERRITIN  TRIGLYCERIDES  FIBRINOGEN  C-REACTIVE PROTEIN    EKG None  Radiology DG Chest Portable 1 View  Result Date: 03/05/2020 CLINICAL DATA:  Shortness of breath, COVID positive. EXAM: PORTABLE CHEST 1 VIEW COMPARISON:  None. FINDINGS: The heart size and mediastinal contours are within normal limits. Minimal airspace opacities are seen in the right upper lung and the left lower lung. There is no pleural effusion or pneumothorax. The visualized skeletal structures are unremarkable. IMPRESSION: Minimal airspace opacities in the right upper lung and left lower lung may reflect COVID-19 pneumonia. Electronically Signed   By: Zerita Boers M.D.   On: 03/05/2020 13:10        Procedures .Critical Care Performed by: Nettie Elm, PA-C Authorized by: Nettie Elm, PA-C   Critical care provider statement:    Critical care time (minutes):  45   Critical care was necessary to treat or prevent  imminent or life-threatening deterioration of the following conditions:  Respiratory failure   Critical care was time spent personally by me on the following activities:  Discussions with consultants, evaluation of patient's response to treatment, examination of patient, ordering and performing treatments and interventions, ordering and review of laboratory studies, ordering and review of radiographic studies, pulse oximetry, re-evaluation of patient's condition, obtaining history from patient or surrogate and review of old charts   (including critical care time)  Medications Ordered in ED Medications  remdesivir 100 mg in sodium chloride 0.9 % 100 mL IVPB (100 mg Intravenous New Bag/Given 03/05/20 2239)  remdesivir 100 mg in sodium chloride 0.9 % 100 mL IVPB (has no administration in time range)  acetaminophen (TYLENOL) tablet 650 mg (650 mg Oral Given 03/05/20 2040)  methylPREDNISolone sodium succinate (SOLU-MEDROL) 125 mg/2 mL injection 60 mg (60 mg Intravenous Given 03/05/20 2237)   ED Course  I have reviewed the triage vital signs and the nursing notes.  Pertinent labs & imaging results that were available during my care of the patient were reviewed by me and considered in my medical decision making (see chart for details).  64 year old presents for evaluation of cough and SOB.  Covid positive, tested 5 days ago however results resulted today.  Patient with increased shortness of breath.  Patient hypoxic to 88% on room air tachypneic on arrival in respiratory distress.  Taken back to a room.  Noted to be febrile to 103.2.  Patient defervesced with Tylenol.  Patient placed on supplemental oxygen.  Labs obtained.  Her heart and lungs are clear.  Her abdomen is soft, nontender.  Unilateral leg swelling, redness or warmth.  She appears well-hydrated.  Patient's likely hypoxia and fever due to her known Covid infection.  I have low suspicion for sepsis. Plan on labs, imaging and reassess  Labs and  imaging personally viewed and interpreted:  CBC without leukocytosis Metabolic panel with mild hyperglycemia to 137 however no additional electrolyte, renal abnormality Lactic 1.1 Dimer 0.43 Chest  x-ray obtained previously today shows airspace opacities consistent with Covid pneumonia EKG not pulling over into epic.  No STEMI  Patient reassessed.  She is on 2 L of oxygen.  Intermittently has tachypnea however appears improved respiratory status.  No acute respiratory distress.  Discussed inpatient admission.  She is agreeable to this.  Patient given Solu-Medrol, remdesivir.  Plan to consult hospitalist for acute hypoxic respiratory failure due to Covid pneumonia.  Low suspicion for ACS, PE, secondary bacterial pneumonia as cause of her hypoxia  CONSULT with Dr. Hal Hope who agrees to accept patient in transfer and place bed request  The patient appears reasonably stabilized for admission considering the current resources, flow, and capabilities available in the ED at this time, and I doubt any other [facility] requiring further screening and/or treatment in the ED prior to admission.  Discussed with attending Dr. Rex Kras who agrees with above treatment, plan and disposition.    MDM Rules/Calculators/A&P                          CARNELLA FRYMAN was evaluated in Emergency Department on 03/05/2020 for the symptoms described in the history of present illness. She was evaluated in the context of the global COVID-19 pandemic, which necessitated consideration that the patient might be at risk for infection with the SARS-CoV-2 virus that causes COVID-19. Institutional protocols and algorithms that pertain to the evaluation of patients at risk for COVID-19 are in a state of rapid change based on information released by regulatory bodies including the CDC and federal and state organizations. These policies and algorithms were followed during the patient's care in the ED. Final Clinical Impression(s) / ED  Diagnoses Final diagnoses:  Acute respiratory failure with hypoxia ([place])  Pneumonia due to COVID-19 virus    Rx / DC Orders ED Discharge Orders    None       Elihu Milstein A, PA-C 03/05/20 2310    Little, Wenda Overland, MD 03/06/20 1940

## 2020-03-06 ENCOUNTER — Encounter (HOSPITAL_COMMUNITY): Payer: Self-pay | Admitting: Internal Medicine

## 2020-03-06 DIAGNOSIS — E785 Hyperlipidemia, unspecified: Secondary | ICD-10-CM

## 2020-03-06 DIAGNOSIS — J1282 Pneumonia due to coronavirus disease 2019: Secondary | ICD-10-CM

## 2020-03-06 DIAGNOSIS — D696 Thrombocytopenia, unspecified: Secondary | ICD-10-CM

## 2020-03-06 DIAGNOSIS — J9600 Acute respiratory failure, unspecified whether with hypoxia or hypercapnia: Secondary | ICD-10-CM

## 2020-03-06 DIAGNOSIS — I10 Essential (primary) hypertension: Secondary | ICD-10-CM

## 2020-03-06 DIAGNOSIS — K219 Gastro-esophageal reflux disease without esophagitis: Secondary | ICD-10-CM | POA: Diagnosis present

## 2020-03-06 DIAGNOSIS — J9601 Acute respiratory failure with hypoxia: Secondary | ICD-10-CM

## 2020-03-06 DIAGNOSIS — Z9981 Dependence on supplemental oxygen: Secondary | ICD-10-CM | POA: Diagnosis not present

## 2020-03-06 DIAGNOSIS — M1990 Unspecified osteoarthritis, unspecified site: Secondary | ICD-10-CM | POA: Diagnosis present

## 2020-03-06 DIAGNOSIS — U071 COVID-19: Secondary | ICD-10-CM | POA: Diagnosis present

## 2020-03-06 DIAGNOSIS — R42 Dizziness and giddiness: Secondary | ICD-10-CM | POA: Diagnosis present

## 2020-03-06 DIAGNOSIS — G4733 Obstructive sleep apnea (adult) (pediatric): Secondary | ICD-10-CM | POA: Diagnosis present

## 2020-03-06 DIAGNOSIS — F419 Anxiety disorder, unspecified: Secondary | ICD-10-CM | POA: Diagnosis present

## 2020-03-06 DIAGNOSIS — Z8249 Family history of ischemic heart disease and other diseases of the circulatory system: Secondary | ICD-10-CM | POA: Diagnosis not present

## 2020-03-06 DIAGNOSIS — R197 Diarrhea, unspecified: Secondary | ICD-10-CM | POA: Diagnosis present

## 2020-03-06 DIAGNOSIS — M810 Age-related osteoporosis without current pathological fracture: Secondary | ICD-10-CM | POA: Diagnosis present

## 2020-03-06 DIAGNOSIS — K2270 Barrett's esophagus without dysplasia: Secondary | ICD-10-CM | POA: Diagnosis present

## 2020-03-06 LAB — ABO/RH: ABO/RH(D): B POS

## 2020-03-06 LAB — PROCALCITONIN
Procalcitonin: <0.1 ng/mL
Procalcitonin: <0.1 ng/mL

## 2020-03-06 LAB — HIV ANTIBODY (ROUTINE TESTING W REFLEX): HIV Screen 4th Generation wRfx: NONREACTIVE

## 2020-03-06 LAB — LACTIC ACID, PLASMA: Lactic Acid, Venous: 0.9 mmol/L (ref 0.5–1.9)

## 2020-03-06 LAB — BRAIN NATRIURETIC PEPTIDE: B Natriuretic Peptide: 180 pg/mL — ABNORMAL HIGH (ref 0.0–100.0)

## 2020-03-06 MED ORDER — BIOTENE DRY MOUTH MT LIQD: 15.0000 mL | OROMUCOSAL | Status: DC | PRN

## 2020-03-06 MED ORDER — SODIUM CHLORIDE 0.9% FLUSH
3.0000 mL | Freq: Two times a day (BID) | INTRAVENOUS | Status: DC
  Administered 2020-03-06 – 2020-03-08 (×4): 3 mL via INTRAVENOUS

## 2020-03-06 MED ORDER — PHENYLEPHRINE-MINERAL OIL-PET 0.25-14-74.9 % RE OINT
1.0000 "application " | TOPICAL_OINTMENT | Freq: Two times a day (BID) | RECTAL | Status: DC | PRN
  Filled 2020-03-06: qty 57

## 2020-03-06 MED ORDER — DIPHENHYDRAMINE HCL 25 MG PO CAPS
25.0000 mg | ORAL_CAPSULE | Freq: Four times a day (QID) | ORAL | Status: DC | PRN
  Administered 2020-03-06: 25 mg via ORAL
  Filled 2020-03-06: qty 1

## 2020-03-06 MED ORDER — DEXAMETHASONE 6 MG PO TABS
6.0000 mg | ORAL_TABLET | ORAL | Status: DC
  Administered 2020-03-06 – 2020-03-08 (×3): 6 mg via ORAL
  Filled 2020-03-06 (×3): qty 1

## 2020-03-06 MED ORDER — ONDANSETRON HCL 4 MG PO TABS: 4.0000 mg | ORAL_TABLET | Freq: Four times a day (QID) | ORAL | Status: DC | PRN

## 2020-03-06 MED ORDER — SODIUM CHLORIDE 0.9 % IV SOLN: 250.0000 mL | INTRAVENOUS | Status: DC | PRN

## 2020-03-06 MED ORDER — IPRATROPIUM BROMIDE HFA 17 MCG/ACT IN AERS
2.0000 | INHALATION_SPRAY | Freq: Four times a day (QID) | RESPIRATORY_TRACT | Status: DC
  Administered 2020-03-06 – 2020-03-08 (×9): 2 via RESPIRATORY_TRACT
  Filled 2020-03-06: qty 12.9

## 2020-03-06 MED ORDER — DIPHENOXYLATE-ATROPINE 2.5-0.025 MG PO TABS
1.0000 | ORAL_TABLET | Freq: Four times a day (QID) | ORAL | Status: DC | PRN
  Filled 2020-03-06: qty 1

## 2020-03-06 MED ORDER — LOSARTAN POTASSIUM 50 MG PO TABS: 25.0000 mg | ORAL_TABLET | Freq: Every day | ORAL | Status: DC

## 2020-03-06 MED ORDER — ONDANSETRON HCL 4 MG/2ML IJ SOLN
4.0000 mg | Freq: Four times a day (QID) | INTRAMUSCULAR | Status: DC | PRN
  Administered 2020-03-07 (×2): 4 mg via INTRAVENOUS
  Filled 2020-03-06 (×2): qty 2

## 2020-03-06 MED ORDER — SODIUM CHLORIDE 0.9% FLUSH
3.0000 mL | Freq: Two times a day (BID) | INTRAVENOUS | Status: DC
  Administered 2020-03-06: 3 mL via INTRAVENOUS

## 2020-03-06 MED ORDER — ACETAMINOPHEN 325 MG PO TABS
650.0000 mg | ORAL_TABLET | Freq: Four times a day (QID) | ORAL | Status: DC | PRN
  Administered 2020-03-07 – 2020-03-08 (×2): 650 mg via ORAL
  Filled 2020-03-06 (×2): qty 2

## 2020-03-06 MED ORDER — ROSUVASTATIN CALCIUM 20 MG PO TABS: 20.0000 mg | ORAL_TABLET | Freq: Every day | ORAL | Status: DC

## 2020-03-06 MED ORDER — HYDROCORT-PRAMOXINE (PERIANAL) 2.5-1 % EX CREA
TOPICAL_CREAM | Freq: Two times a day (BID) | CUTANEOUS | Status: DC | PRN
  Filled 2020-03-06: qty 30

## 2020-03-06 MED ORDER — MELATONIN 5 MG PO TABS
5.0000 mg | ORAL_TABLET | Freq: Every day | ORAL | Status: DC
  Administered 2020-03-06: 5 mg via ORAL
  Filled 2020-03-06 (×2): qty 1

## 2020-03-06 MED ORDER — GUAIFENESIN-DM 100-10 MG/5ML PO SYRP
10.0000 mL | ORAL_SOLUTION | ORAL | Status: DC | PRN
  Administered 2020-03-08: 10 mL via ORAL
  Filled 2020-03-06: qty 10

## 2020-03-06 MED ORDER — HYDROCORT-PRAMOXINE (PERIANAL) 1-1 % EX FOAM
1.0000 | Freq: Two times a day (BID) | CUTANEOUS | Status: DC | PRN
  Administered 2020-03-06: 1 via RECTAL
  Filled 2020-03-06: qty 10

## 2020-03-06 MED ORDER — ENSURE ENLIVE PO LIQD
237.0000 mL | Freq: Two times a day (BID) | ORAL | Status: DC
  Administered 2020-03-07: 237 mL via ORAL

## 2020-03-06 MED ORDER — SODIUM CHLORIDE 0.9% FLUSH: 3.0000 mL | INTRAVENOUS | Status: DC | PRN

## 2020-03-06 MED ORDER — ASCORBIC ACID 500 MG PO TABS
500.0000 mg | ORAL_TABLET | Freq: Every day | ORAL | Status: DC
  Administered 2020-03-06 – 2020-03-08 (×3): 500 mg via ORAL
  Filled 2020-03-06 (×3): qty 1

## 2020-03-06 MED ORDER — ZINC SULFATE 220 (50 ZN) MG PO CAPS
220.0000 mg | ORAL_CAPSULE | Freq: Every day | ORAL | Status: DC
  Administered 2020-03-06 – 2020-03-08 (×3): 220 mg via ORAL
  Filled 2020-03-06 (×3): qty 1

## 2020-03-06 MED ORDER — ENOXAPARIN SODIUM 40 MG/0.4ML ~~LOC~~ SOLN
40.0000 mg | SUBCUTANEOUS | Status: DC
  Administered 2020-03-06 – 2020-03-08 (×3): 40 mg via SUBCUTANEOUS
  Filled 2020-03-06 (×3): qty 0.4

## 2020-03-06 NOTE — Progress Notes (Signed)
SATURATION QUALIFICATIONS: (This note is used to comply with regulatory documentation for home oxygen)  Patient Saturations on Room Air at Rest = 91%  Patient Saturations on Room Air while Ambulating = 83%  Patient Saturations on 2 Liters of oxygen while Ambulating = 94%

## 2020-03-06 NOTE — Progress Notes (Signed)
Patients saturations 92% and greater at rest on room air. Patient does become hypoxic with activity and requires 2L [place] for ambulation.

## 2020-03-06 NOTE — H&P (Signed)
History and Physical:    Patty [name]   PPI:[id number] DOB: February 19, 1956 DOA: 03/05/2020  Referring MD/provider: Shelby Dubin PCP: Glendon Axe, MD   Patient coming from: Home  Chief Complaint: Muscle aches, dizziness, diarrhea, cough, shortness of breath  History of Present Illness:   Patty [name] is an 64 y.o. female with a PMH of Barrett's esophagus, anxiety, HTN, HLD, OSA who was admitted with a chief complaint of cough associated with shortness of breath, muscle aches, dizziness, and abdominal discomfort/diarrea. Symptoms began 5 days ago.  The patient states that her husband has not been vaccinated for Covid however she was vaccinated with the Wynetta Emery and Garey vaccine back in April, and that he has been sick with Covid symptoms x 2 weeks.  Both she and her husband tested + for covid on 02/28/20. She said she has been running a fever as well, with temperature as high as 103.5 orally.  She also endorses loss of taste and smell.   ED Course:  The patient   ROS:   Review of Systems  Constitutional: Positive for chills, fever and malaise/fatigue.  HENT: Positive for sore throat.   Respiratory: Positive for cough, sputum production and shortness of breath.   Gastrointestinal: Positive for abdominal pain, diarrhea and nausea.  Genitourinary: Negative.   Musculoskeletal: Positive for myalgias.  Neurological: Positive for dizziness and headaches.  Endo/Heme/Allergies: Negative.   Psychiatric/Behavioral: The patient is nervous/anxious.     Past Medical History:   Past Medical History:  Diagnosis Date  . Anxiety   . Barrett's esophagus   . Cardiomegaly   . Dyspareunia   . Fatty pancreas   . Gastric ulcer   . GERD (gastroesophageal reflux disease)   . Hx of colonic polyps   . Hyperlipidemia   . Hypertension   . Osteopenia   . Osteoporosis   . Shingles   . Sleep apnea   . Thyroid nodule   . Uterine leiomyoma     Past Surgical History:   Past Surgical History:   Procedure Laterality Date  . BIOPSY THYROID    . COLONOSCOPY W/ POLYPECTOMY    . TUBAL LIGATION      Social History:   Social History   Socioeconomic History  . Marital status: Married    Spouse name: Not on file  . Number of children: Not on file  . Years of education: Not on file  . Highest education level: Not on file  Occupational History  . Not on file  Tobacco Use  . Smoking status: Never Smoker  . Smokeless tobacco: Never Used  Vaping Use  . Vaping Use: Never used  Substance and Sexual Activity  . Alcohol use: No    Alcohol/week: 0.0 standard drinks  . Drug use: No  . Sexual activity: Yes    Partners: Male    Birth control/protection: Surgical    Comment: BTL  Other Topics Concern  . Not on file  Social History Narrative  . Not on file   Social Determinants of Health   Financial Resource Strain:   . Difficulty of Paying Living Expenses: Not on file  Food Insecurity:   . Worried About Charity fundraiser in the Last Year: Not on file  . Ran Out of Food in the Last Year: Not on file  Transportation Needs:   . Lack of Transportation (Medical): Not on file  . Lack of Transportation (Non-Medical): Not on file  Physical Activity:   . Days of  Exercise per Week: Not on file  . Minutes of Exercise per Session: Not on file  Stress:   . Feeling of Stress : Not on file  Social Connections:   . Frequency of Communication with Friends and Family: Not on file  . Frequency of Social Gatherings with Friends and Family: Not on file  . Attends Religious Services: Not on file  . Active Member of Clubs or Organizations: Not on file  . Attends Archivist Meetings: Not on file  . Marital Status: Not on file  Intimate Partner Violence:   . Fear of Current or Ex-Partner: Not on file  . Emotionally Abused: Not on file  . Physically Abused: Not on file  . Sexually Abused: Not on file    Allergies   Patient has no known allergies.  Family history:    Family History  Problem Relation Age of Onset  . Hypertension Mother   . Heart disease Mother   . Heart disease Father   . Hypertension Brother     Current Medications:   Prior to Admission medications   Medication Sig Start Date End Date Taking? Authorizing Provider  COVID-19 Specimen Collection KIT SWAB AS DIRECTED 03/01/20   [provider]  CRESTOR 20 MG tablet Take 20 mg by mouth at bedtime. 04/06/14   [provider]  losartan (COZAAR) 25 MG tablet Take 25 mg by mouth daily. 04/10/14   [provider]    Physical Exam:   Vitals:   03/06/20 0300 03/06/20 0449 03/06/20 0517 03/06/20 0520  BP: 124/64 129/77  130/77  Pulse: 77 85  84  Resp: (!) 21 (!) 30  (!) 23  Temp: 99.3 F (37.4 C) 99.1 F (37.3 C)  99.1 F (37.3 C)  TempSrc: Oral     SpO2: 97% 95%  93%  Weight:   64 kg   Height:   '5\' 1"'$  (1.549 m)      Physical Exam: Blood pressure 130/77, pulse 84, temperature 99.1 F (37.3 C), resp. rate (!) 23, height $RemoveBe'5\' 1"'QwkAMPpxw$  (1.549 m), weight 64 kg, last menstrual period 07/06/2008, SpO2 93 %. Gen: No acute distress. Head: Normocephalic, atraumatic. Eyes: Extraocular movements intact.  Sclerae nonicteric. No lid lag. Mouth: Oropharynx reveals dry mucous membranes. Dentition is normal. Neck: Supple, no thyromegaly, no lymphadenopathy, no jugular venous distention. Chest: Lungs are mildly diminished to auscultation Tachypneic with supplemental oxygen in place. No rales, rhonchi or wheezes. CV: Heart sounds are regular with an S1, S2. No murmurs, rubs, clicks, or gallops.  Abdomen: Soft, nontender, nondistended with normal active bowel sounds. No hepatosplenomegaly or palpable masses. Extremities: Extremities are without clubbing, or cyanosis. No edema. Pedal pulses 2+.  Skin: Warm and dry. No rashes, lesions or wounds. Neuro: Alert and oriented times 3; grossly nonfocal.  Psych: Insight is good and judgment is appropriate. Mood and affect  normal.   Data Review:    Labs: Basic Metabolic Panel: Recent Labs  Lab 03/05/20 2104  NA 135  K 3.7  CL 99  CO2 24  GLUCOSE 137*  BUN 10  CREATININE 0.78  CALCIUM 8.8*   Liver Function Tests: Recent Labs  Lab 03/05/20 2104  AST 39  ALT 35  ALKPHOS 100  BILITOT 0.6  PROT 6.7  ALBUMIN 3.6   CBC: Recent Labs  Lab 03/05/20 2104  WBC 5.7  NEUTROABS 4.5  HGB 13.3  HCT 40.2  MCV 88.5  PLT 121*   COVID-19 Labs  Recent Labs  03/05/20 2104  DDIMER 0.43  FERRITIN 703*  LDH 340*  CRP 10.7*    No results found for: SARSCOV2NAA   Radiographic Studies: DG Chest Portable 1 View  Result Date: 03/05/2020 CLINICAL DATA:  Shortness of breath, COVID positive. EXAM: PORTABLE CHEST 1 VIEW COMPARISON:  None. FINDINGS: The heart size and mediastinal contours are within normal limits. Minimal airspace opacities are seen in the right upper lung and the left lower lung. There is no pleural effusion or pneumothorax. The visualized skeletal structures are unremarkable. IMPRESSION: Minimal airspace opacities in the right upper lung and left lower lung may reflect COVID-19 pneumonia. Electronically Signed   By: Zerita Boers M.D.   On: 03/05/2020 13:10   *Personally reviewed and show findings concerning for infiltrates in the right upper lung and left lower lung.  EKG: Independently reviewed. NSR at 95 bpm. LVH. QRS 82 ms. QTc 452 ms.   Assessment/Plan:   Active Problems:   Acute hypoxic respiratory failure due to COVID-19 [facility]) -Patient with presenting with SOB and reported fever at home; also with cough productive of clear sputum. -Diarrhea and mild GI symptoms noted. -She does not have a usual home O2 requirement and is currently requiring 5L [place] O2 -COVID POSITIVE -The patient has comorbidities which may increase the risk for ARDS/MODS including: age, HTN, OSA. -Inflammatory markers are elevated. -CXR with multifocal opacities c/w COVID vs. Multifocal PNA.  -Check  procalcitonin, if <0.5 will not need antibiotics but if > 0.5, consider adding them. -Will admit for further evaluation, close monitoring, and treatment, anticipated LOS > 2 days given acute hypoxic respiratory failure. -Monitor on telemetry x at least 24 hours. -At this time, will attempt to avoid use of aerosolized medications and use HFAs instead. -Will check daily labs including BMP with Mag, Phos; LFTs; CBC with differential; CRP; ferritin; fibrinogen; D-dimer -Will order steroids and Remdesivir (pharmacy consult) given +COVID test, +CXR, and hypoxia <94% on room air. -If the patient shows clinical deterioration, consider transfer to ICU with PCCM consultation. -Consider Tocilizumab and/or convalescent plasma if the patient does not stabilize on current treatment or if the patient has marked clinical decompensation; the patient does not appear to require these treatments at this time. -Will attempt to maintain euvolemia to a net negative fluid status. No IVF have been ordered. -Will ask the patient to maintain an awake prone position for 16+ hours a day, if possible, with a minimum of 2-3 hours at a time. -With D-dimer < 5, will use prophylactic dose Lovenox. -Patient was seen wearing full PPE including: gown, gloves, head cover, CAPR; donning and doffing was in compliance with current standards.    Thrombocytopenia Monitor.    HTN Continue Cozaar. BP stable.    HLD Continue statin.  HIV screening The patient falls between the ages of 13-64 and should be screened for HIV, therefore HIV testing ordered.  Body mass index is 26.66 kg/m.  Other information:   DVT prophylaxis: Lovenox ordered. Code Status: Full code. Confirmed with the patient that she would want to be intubated. Family Communication: No family at the bedside.  Disposition Plan: Home when respiratory status stable and oxygen requirements stable with signs of medical stability. Consults called: None Admission  status: Inpatient.  The medical decision making on this patient was of high complexity and the patient is at high risk for clinical deterioration, therefore this is a level 3 visit.   Jacquelynn Cree MD [facility]   How to contact the [facility] Attending or  Consulting provider [place] or covering provider during after hours [place], for this patient?   1. Check the care team in [facility] and look for a) attending/consulting [facility] provider listed and b) the [facility] team listed 2. Log into [email or web address] and use [place]'s universal password to access. If you do not have the password, please contact the hospital operator. 3. Locate the [facility] provider you are looking for under [facility] and page to a number that you can be directly reached. 4. If you still have difficulty reaching the provider, please page the [facility] (Director on Call) for the Hospitalists listed on amion for assistance.  03/06/2020, 8:39 AM

## 2020-03-06 NOTE — Progress Notes (Signed)
Initial Nutrition Assessment  DOCUMENTATION CODES:   Not applicable  INTERVENTION:  Provide Ensure Enlive po BID, each supplement provides 350 kcal and 20 grams of protein.  Encourage adequate PO intake.   NUTRITION DIAGNOSIS:   Increased nutrient needs related to acute illness (COVID) as evidenced by estimated needs.  GOAL:   Patient will meet greater than or equal to 90% of their needs  MONITOR:   PO intake, Supplement acceptance, Skin, Weight trends, Labs, I & O's  REASON FOR ASSESSMENT:   Malnutrition Screening Tool    ASSESSMENT:   64 y.o. female with a PMH of Barrett's esophagus, anxiety, HTN, HLD, OSA who was admitted with a chief complaint of cough associated with shortness of breath, muscle aches, dizziness, and abdominal discomfort/diarrhea. Pt COVID positive. Pt with Acute hypoxic respiratory failure due to COVID-19.  Pt reports increased work of breathing with desire to put her her nasal cannula back on. RN made aware. Pt reports eating well with no difficulties and no weight loss. Pt provided RD with her food preferences. RD made changed in [place]. RD to additionally order nutritional supplements to aid in increased caloric and protein needs. Pt encouraged to eat her food at meals and to drink her supplements.  Unable to complete Nutrition-Focused physical exam at this time.  Labs and medications reviewed.   Diet Order:   Diet Order            Diet regular Room service appropriate? Yes; Fluid consistency: Thin  Diet effective now                 EDUCATION NEEDS:   Not appropriate for education at this time  Skin:  Skin Assessment: Reviewed RN Assessment  Last BM:  9/1  Height:   Ht Readings from Last 1 Encounters:  03/06/20 5\' 1"  (1.549 m)    Weight:   Wt Readings from Last 1 Encounters:  03/06/20 64 kg   BMI:  Body mass index is 26.66 kg/m.  Estimated Nutritional Needs:   Kcal:  1800-2000  Protein:  85-100  grams  Fluid:  >/= 1.8 L/day  Corrin Parker, MS, RD, LDN RD pager number/after hours weekend pager number on Amion.

## 2020-03-07 LAB — COMPREHENSIVE METABOLIC PANEL
ALT: 30 U/L (ref 0–44)
AST: 33 U/L (ref 15–41)
Albumin: 3.4 g/dL — ABNORMAL LOW (ref 3.5–5.0)
Alkaline Phosphatase: 92 U/L (ref 38–126)
Anion gap: 13 (ref 5–15)
BUN: 12 mg/dL (ref 8–23)
CO2: 23 mmol/L (ref 22–32)
Calcium: 9 mg/dL (ref 8.9–10.3)
Chloride: 104 mmol/L (ref 98–111)
Creatinine, Ser: 0.81 mg/dL (ref 0.44–1.00)
GFR calc Af Amer: >60 mL/min (ref >60)
GFR calc non Af Amer: >60 mL/min (ref >60)
Glucose, Bld: 165 mg/dL — ABNORMAL HIGH (ref 70–99)
Potassium: 4.1 mmol/L (ref 3.5–5.1)
Sodium: 140 mmol/L (ref 135–145)
Total Bilirubin: 0.5 mg/dL (ref 0.3–1.2)
Total Protein: 6.9 g/dL (ref 6.5–8.1)

## 2020-03-07 LAB — CBC WITH DIFFERENTIAL/PLATELET
Abs Immature Granulocytes: 0.07 10*3/uL (ref 0.00–0.07)
Basophils Absolute: 0 10*3/uL (ref 0.0–0.1)
Basophils Relative: 0 %
Eosinophils Absolute: 0 10*3/uL (ref 0.0–0.5)
Eosinophils Relative: 0 %
HCT: 41.4 % (ref 36.0–46.0)
Hemoglobin: 13.4 g/dL (ref 12.0–15.0)
Immature Granulocytes: 1 %
Lymphocytes Relative: 11 %
Lymphs Abs: 1 10*3/uL (ref 0.7–4.0)
MCH: 29.3 pg (ref 26.0–34.0)
MCHC: 32.4 g/dL (ref 30.0–36.0)
MCV: 90.6 fL (ref 80.0–100.0)
Monocytes Absolute: 0.8 10*3/uL (ref 0.1–1.0)
Monocytes Relative: 8 %
Neutro Abs: 7.8 10*3/uL — ABNORMAL HIGH (ref 1.7–7.7)
Neutrophils Relative %: 80 %
Platelets: 281 10*3/uL (ref 150–400)
RBC: 4.57 MIL/uL (ref 3.87–5.11)
RDW: 12.6 % (ref 11.5–15.5)
WBC: 9.6 10*3/uL (ref 4.0–10.5)
nRBC: 0 % (ref 0.0–0.2)

## 2020-03-07 LAB — C-REACTIVE PROTEIN: CRP: 5.8 mg/dL — ABNORMAL HIGH (ref <1.0)

## 2020-03-07 LAB — BRAIN NATRIURETIC PEPTIDE: B Natriuretic Peptide: 107.4 pg/mL — ABNORMAL HIGH (ref 0.0–100.0)

## 2020-03-07 LAB — D-DIMER, QUANTITATIVE: D-Dimer, Quant: 0.42 ug/mL-FEU (ref 0.00–0.50)

## 2020-03-07 LAB — MAGNESIUM: Magnesium: 2.1 mg/dL (ref 1.7–2.4)

## 2020-03-07 MED ORDER — DIPHENHYDRAMINE HCL 25 MG PO CAPS
50.0000 mg | ORAL_CAPSULE | Freq: Every day | ORAL | Status: DC
  Filled 2020-03-07: qty 2

## 2020-03-07 MED ORDER — BISMUTH SUBSALICYLATE 262 MG PO CHEW
252.0000 mg | CHEWABLE_TABLET | ORAL | Status: DC | PRN
  Administered 2020-03-08: 524 mg via ORAL
  Filled 2020-03-07 (×2): qty 2

## 2020-03-07 MED ORDER — DIPHENHYDRAMINE-APAP (SLEEP) 25-500 MG PO TABS: 2.0000 | ORAL_TABLET | Freq: Every day | ORAL | Status: DC

## 2020-03-07 MED ORDER — MELATONIN 5 MG PO TABS
5.0000 mg | ORAL_TABLET | Freq: Every day | ORAL | Status: DC
  Administered 2020-03-07: 5 mg via ORAL
  Filled 2020-03-07 (×2): qty 1

## 2020-03-07 MED ORDER — ALPRAZOLAM 0.5 MG PO TABS
0.5000 mg | ORAL_TABLET | Freq: Every day | ORAL | Status: DC
  Administered 2020-03-07: 0.5 mg via ORAL
  Filled 2020-03-07: qty 1

## 2020-03-07 MED ORDER — SODIUM CHLORIDE 0.9 % IV SOLN: INTRAVENOUS | Status: DC | PRN

## 2020-03-07 MED ORDER — ACETAMINOPHEN 500 MG PO TABS
1000.0000 mg | ORAL_TABLET | Freq: Every day | ORAL | Status: DC
  Filled 2020-03-07: qty 2

## 2020-03-07 NOTE — Progress Notes (Signed)
PROGRESS NOTE                                                                                                                                                                                                             Patient Demographics:    Patty [name], is a 64 y.o. female, DOB - 09/11/1955, HDQ:[id number]  Outpatient Primary MD for the patient is Glendon Axe, MD    LOS - 1  Admit date - 03/05/2020    Chief Complaint  Patient presents with  . Cough       Brief Narrative - Patty [name] is an 64 y.o. female who is vaccinated for Covid with a PMH of Barrett's esophagus, anxiety, HTN, HLD, OSA who was admitted with a chief complaint of cough associated with shortness of breath, muscle aches, dizziness, and abdominal discomfort/diarrea. Symptoms began 5 days ago, upon arrival to the hospital she was diagnosed with acute hypoxic respiratory failure due to COVID-19 pneumonia and admitted to the hospital.   Subjective:    Patty [name] today has, No headache, No chest pain, No abdominal pain - No Nausea, No new weakness tingling or numbness, mild Cough - SOB.     Assessment  & Plan :     1. Acute Hypoxic Resp. Failure due to Acute Covid 19 Viral Pneumonitis during the ongoing 2020 Covid 19 Pandemic - she is fully vaccinated and thankfully has mild to moderate disease, is on steroids and remdesivir combination with clinical improvement and stabilization.  Inflammatory markers are trending down, will continue to monitor if she is better will try and discharge her on 03/08/2020 with home oxygen and outpt remdesivir infusions.  Encouraged the patient to sit up in chair in the daytime use I-S and flutter valve for pulmonary toiletry and then prone in bed when at night.  Will advance activity and titrate down oxygen as possible.     SpO2: 93 % O2 Flow Rate (L/min): 2 L/min  Recent Labs  Lab 03/05/20 2104 03/05/20 2343 03/06/20 1207  03/06/20 1208 03/07/20 0500 03/07/20 1120  WBC 5.7  --   --   --  9.6  --   PLT 121*  --   --   --  281  --   CRP 10.7*  --   --   --  5.8*  --  BNP  --   --  180.0*  --   --  107.4*  DDIMER 0.43  --   --   --  0.42  --   PROCALCITON <0.10  --   --  <0.10  --   --   AST 39  --   --   --  33  --   ALT 35  --   --   --  30  --   ALKPHOS 100  --   --   --  92  --   BILITOT 0.6  --   --   --  0.5  --   ALBUMIN 3.6  --   --   --  3.4*  --   LATICACIDVEN 1.1 0.9  --   --   --   --        2.  Insomnia.  Home as needed medications resumed.  3.  Reflux.  Started on Pepto-Bismol which she takes at home.     Condition - Fair  Family Communication  :  Son Ovid Curd [phone] on 03/07/20, message left at 2:04 PM.  Code Status :  Full  Consults  :  None  Procedures  :  None  PUD Prophylaxis : None  Disposition Plan  :    Status is: Inpatient  Remains inpatient appropriate because:IV treatments appropriate due to intensity of illness or inability to take PO   Dispo: The patient is from: Home              Anticipated d/c is to: Home              Anticipated d/c date is: > 3 days              Patient currently is not medically stable to d/c.   DVT Prophylaxis  :  Lovenox    Lab Results  Component Value Date   PLT 281 03/07/2020    Diet :  Diet Order            Diet regular Room service appropriate? Yes; Fluid consistency: Thin  Diet effective now                  Inpatient Medications  Scheduled Meds: . vitamin C  500 mg Oral Daily  . dexamethasone  6 mg Oral Q24H  . enoxaparin (LOVENOX) injection  40 mg Subcutaneous Q24H  . feeding supplement (ENSURE ENLIVE)  237 mL Oral BID BM  . ipratropium  2 puff Inhalation Q6H  . melatonin  5 mg Oral QHS  . sodium chloride flush  3 mL Intravenous Q12H  . zinc sulfate  220 mg Oral Daily   Continuous Infusions: . remdesivir 100 mg in NS 100 mL 100 mg (03/07/20 0827)   PRN Meds:.acetaminophen, antiseptic oral rinse,  diphenhydrAMINE, diphenoxylate-atropine, guaiFENesin-dextromethorphan, hydrocortisone-pramoxine, [DISCONTINUED] ondansetron **OR** ondansetron (ZOFRAN) IV, phenylephrine-shark liver oil-mineral oil-petrolatum  Antibiotics  :    Anti-infectives (From admission, onward)   Start     Dose/Rate Route Frequency Ordered Stop   03/06/20 1000  remdesivir 100 mg in sodium chloride 0.9 % 100 mL IVPB        100 mg 200 mL/hr over 30 Minutes Intravenous Daily 03/05/20 2213 03/10/20 0959   03/05/20 2230  remdesivir 100 mg in sodium chloride 0.9 % 100 mL IVPB        100 mg 200 mL/hr over 30 Minutes Intravenous Every 30 min 03/05/20 2213 03/06/20 0016  Time Spent in minutes  30   Lala Lund M.D on 03/07/2020 at 1:59 PM  To page go to [email or web address] - password [facility]  [facility] -  Office  [phone]    See all Orders from today for further details    Objective:   Vitals:   03/07/20 0343 03/07/20 0715 03/07/20 0813 03/07/20 1147  BP: 102/69  136/77 131/88  Pulse: 74  77 80  Resp: 18  18 19   Temp: 98.3 F (36.8 C)  98.3 F (36.8 C) 99.4 F (37.4 C)  TempSrc: Oral  Oral Oral  SpO2: 93% 94% 91% 93%  Weight:      Height:        Wt Readings from Last 3 Encounters:  03/06/20 64 kg  03/05/20 64.5 kg  08/02/14 66.2 kg     Intake/Output Summary (Last 24 hours) at 03/07/2020 1359 Last data filed at 03/06/2020 1639 Gross per 24 hour  Intake 340 ml  Output --  Net 340 ml     Physical Exam  Awake Alert, No new F.N deficits, Normal affect [place].AT,PERRAL Supple Neck,No JVD, No cervical lymphadenopathy appriciated.  Symmetrical Chest wall movement, Good air movement bilaterally, CTAB RRR,No Gallops,Rubs or new Murmurs, No Parasternal Heave +ve B.Sounds, Abd Soft, No tenderness, No organomegaly appriciated, No rebound - guarding or rigidity. No Cyanosis, Clubbing or edema, No new Rash or bruise      Data Review:    CBC Recent Labs  Lab 03/05/20 2104 03/07/20 0500   WBC 5.7 9.6  HGB 13.3 13.4  HCT 40.2 41.4  PLT 121* 281  MCV 88.5 90.6  MCH 29.3 29.3  MCHC 33.1 32.4  RDW 12.5 12.6  LYMPHSABS 0.9 1.0  MONOABS 0.3 0.8  EOSABS 0.0 0.0  BASOSABS 0.0 0.0    Recent Labs  Lab 03/05/20 2104 03/05/20 2343 03/06/20 1207 03/06/20 1208 03/07/20 0500 03/07/20 1120  NA 135  --   --   --  140  --   K 3.7  --   --   --  4.1  --   CL 99  --   --   --  104  --   CO2 24  --   --   --  23  --   GLUCOSE 137*  --   --   --  165*  --   BUN 10  --   --   --  12  --   CREATININE 0.78  --   --   --  0.81  --   CALCIUM 8.8*  --   --   --  9.0  --   AST 39  --   --   --  33  --   ALT 35  --   --   --  30  --   ALKPHOS 100  --   --   --  92  --   BILITOT 0.6  --   --   --  0.5  --   ALBUMIN 3.6  --   --   --  3.4*  --   MG  --   --   --   --  2.1  --   CRP 10.7*  --   --   --  5.8*  --   DDIMER 0.43  --   --   --  0.42  --   PROCALCITON <0.10  --   --  <0.10  --   --  LATICACIDVEN 1.1 0.9  --   --   --   --   BNP  --   --  180.0*  --   --  107.4*    ------------------------------------------------------------------------------------------------------------------ Recent Labs    03/05/20 2104  TRIG 128    No results found for: HGBA1C ------------------------------------------------------------------------------------------------------------------ No results for input(s): TSH, T4TOTAL, T3FREE, THYROIDAB in the last 72 hours.  Invalid input(s): FREET3  Cardiac Enzymes No results for input(s): CKMB, TROPONINI, MYOGLOBIN in the last 168 hours.  Invalid input(s): CK ------------------------------------------------------------------------------------------------------------------    Component Value Date/Time   BNP 107.4 (H) 03/07/2020 1120    Micro Results Recent Results (from the past 240 hour(s))  Blood Culture (routine x 2)     Status: None (Preliminary result)   Collection Time: 03/05/20  9:11 PM   Specimen: BLOOD  Result Value Ref Range  Status   Specimen Description   Final    BLOOD RIGHT ANTECUBITAL Performed at [facility], [place] [address], [place], [place] [address]    Special Requests   Final    BOTTLES DRAWN AEROBIC AND ANAEROBIC Blood Culture adequate volume Performed at [facility], [address] [place], [place] [address]    Culture   Final    NO GROWTH 2 DAYS Performed at [facility], [place] [address] [place], [place] [address]    Report Status PENDING  Incomplete  Blood Culture (routine x 2)     Status: None (Preliminary result)   Collection Time: 03/05/20  9:42 PM   Specimen: BLOOD  Result Value Ref Range Status   Specimen Description   Final    BLOOD BLOOD LEFT HAND Performed at [facility], [address] [place], [place] [address]    Special Requests   Final    BOTTLES DRAWN AEROBIC AND ANAEROBIC Blood Culture adequate volume Performed at [facility], [address] [place], [place] [address]    Culture   Final    NO GROWTH 2 DAYS Performed at [facility], [place] [address], [place],  [address]    Report Status PENDING  Incomplete    Radiology Reports DG Chest Portable 1 View  Result Date: 03/05/2020 CLINICAL DATA:  Shortness of breath, COVID positive. EXAM: PORTABLE CHEST 1 VIEW COMPARISON:  None. FINDINGS: The heart size and mediastinal contours are within normal limits. Minimal airspace opacities are seen in the right upper lung and the left lower lung. There is no pleural effusion or pneumothorax. The visualized skeletal structures are unremarkable. IMPRESSION: Minimal airspace opacities in the right upper lung and left lower lung may reflect COVID-19 pneumonia. Electronically Signed   By: Zerita Boers M.D.   On: 03/05/2020 13:10

## 2020-03-07 NOTE — Progress Notes (Signed)
Patient scheduled for outpatient Remdesivir infusions at 9am on Friday 9/3, Saturday 9/4, and Sunday 9/5 at [facility]. Please inform the patient to park at [place], as staff will be escorting the patient through the [place] entrance of the hospital. Appointments will take approximately 45 minutes.    There is a wave flag banner located near the entrance on [address]. Turn into this entrance and immediately turn left and park in 1 of the 5 designated Covid Infusion Parking spots. There is a phone number on the sign, please call and let the staff know what spot you are in and we will come out and get you. For questions call [phone].  Thanks.

## 2020-03-07 NOTE — TOC Progression Note (Signed)
Transition of Care [facility]) - Progression Note    Patient Details  Name: Patty [name] MRN: [id number] Date of Birth: Dec 08, 1955  Transition of Care [facility]) CM/SW Contact  Pollie Friar, RN Phone Number: 03/07/2020, 2:54 PM  Clinical Narrative:    Pt with qualification sats for home oxygen and DME orders for oxygen. CM called into the pts room and she has no preference for home oxygen companies. CM has provided her information to [place] with [employer]. They will deliver oxygen to the room for transport and to the home.  Pt has transportation home when medically ready.   Expected Discharge Plan: Home/Self Care Barriers to Discharge: Continued Medical Work up  Expected Discharge Plan and Services Expected Discharge Plan: Home/Self Care       Living arrangements for the past 2 months: Single Family Home                                       Social Determinants of Health (SDOH) Interventions    Readmission Risk Interventions No flowsheet data found.

## 2020-03-07 NOTE — Progress Notes (Signed)
Patient called this RN in room to discuss possibly leaving AMA due to multiple alarms going off, consistently and her inability to get sleep, since she has been hospitalized. This RN educated the patient on why it was important for her to stay and get the proper care that she needed here at the hospital. I spoke to the patient about other options we could pursue to decrease the amount of alarms that she was hearing. I also educated the patient on the importance of such alarms. I paged [place] B Helyn App, MD to notify her of the patients wishes. I spoke to MD about the patients request for medications to help her sleep and relief from dry mouth.   After addressing the patients concerns, the patient decided that she would like to stay the night, at the hospital and possibly leave tomorrow.   The patient is currently resting quietly without any complaints at this time.

## 2020-03-07 NOTE — TOC Initial Note (Signed)
Transition of Care [facility]) - Initial/Assessment Note    Patient Details  Name: Patty [name] MRN: [id number] Date of Birth: Dec 12, 1955  Transition of Care [facility]) CM/SW Contact:    Verdell Carmine, RN Phone Number: 03/07/2020, 1:37 PM  Clinical Narrative:                 Patient  admitted for COVID, Minimal oxygen at 2L but gets Short of breath when ambulating.  Oxygen qualifiers done by PT this AM. Qualifies for O2. at 2LPM. At this time. No needs for [facility].  At this time will follow.     Expected Discharge Plan: Home/Self Care Barriers to Discharge: Continued Medical Work up   Patient Goals and CMS Choice        Expected Discharge Plan and Services Expected Discharge Plan: Home/Self Care       Living arrangements for the past 2 months: Single Family Home                                      Prior Living Arrangements/Services Living arrangements for the past 2 months: Single Family Home   Patient language and need for interpreter reviewed:: Yes        Need for Family Participation in Patient Care: Yes (Comment) Care giver support system in place?: Yes (comment)   Criminal Activity/Legal Involvement Pertinent to Current Situation/Hospitalization: No - Comment as needed  Activities of Daily Living Home Assistive Devices/Equipment: Eyeglasses ADL Screening (condition at time of admission) Patient's cognitive ability adequate to safely complete daily activities?: Yes Is the patient deaf or have difficulty hearing?: No Does the patient have difficulty seeing, even when wearing glasses/contacts?: No Does the patient have difficulty concentrating, remembering, or making decisions?: No Patient able to express need for assistance with ADLs?: Yes Does the patient have difficulty dressing or bathing?: No Independently performs ADLs?: Yes (appropriate for developmental age) Does the patient have difficulty walking or climbing stairs?: Yes Weakness of Legs: Left Weakness  of Arms/Hands: Both  Permission Sought/Granted      Share Information with NAME: Arsenio Loader           Emotional Assessment       Orientation: : Oriented to Self, Oriented to Place, Oriented to  Time, Oriented to Situation Alcohol / Substance Use: Not Applicable Psych Involvement: No (comment)  Admission diagnosis:  Acute respiratory failure with hypoxia (HCC) [J96.01] Acute respiratory failure due to COVID-19 ([place]) [U07.1, J96.00] Pneumonia due to COVID-19 virus [U07.1, J12.82] Patient Active Problem List   Diagnosis Date Noted  . Pneumonia due to COVID-19 virus 03/06/2020  . HTN (hypertension) 03/06/2020  . Hyperlipidemia 03/06/2020  . Thrombocytopenia ([place]) 03/06/2020  . Acute respiratory failure due to COVID-19 ([place]) 03/05/2020  . 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 ARTHRITIS 04/18/2009  . ABDOMINAL PAIN, GENERALIZED 04/18/2009  . GERD 04/11/2009  . HEMATURIA UNSPECIFIED 12/19/2008  . ABDOMINAL PAIN, EPIGASTRIC 12/19/2008  . ABDOMINAL PAIN, SUPRAPUBIC 12/19/2008  . COLD SORE 05/24/2007  . FIBROIDS, UTERUS 05/24/2007  . FOOT PAIN, LEFT 05/24/2007  . CHEST PAIN, ATYPICAL 05/24/2007   PCP:  Glendon Axe, MD Pharmacy:   [id number] [place], [place] [address] Phone: [phone] Fax: [phone]     Social Determinants of Health (SDOH) Interventions    Readmission Risk Interventions No flowsheet data found.

## 2020-03-07 NOTE — Evaluation (Signed)
Physical Therapy Evaluation & Discharge Patient Details Name: Patty [name] MRN: [id number] DOB: Jan 04, 1956 Today's Date: 03/07/2020   History of Present Illness  Pt is a 64 y.o. female who tested (+) COVID-19 on 02/28/20, now admitted 03/05/20 with SOB, cough, muscle aches, abdominal discomfort; workup for acute hypoxic respiratory failure due to COVID-19. PMH includes HTN, OSA, osteoporosis, anxiety. Pt vaccinated 10/2019.    Clinical Impression  Patient evaluated by Physical Therapy with no further acute PT needs identified. PTA, pt independent and lives with husband. Today, pt independent with mobility and ADL tasks. Pt required 2L O2 to maintain SpO2 >/88% with activity (see SATURATIONS QUALIFICATIONS NOTE). All education has been completed and the patient has no further questions. Acute PT is signing off. Thank you for this referral.    Follow Up Recommendations No PT follow up    Equipment Recommendations   (home oxygen)    Recommendations for Other Services       Precautions / Restrictions Precautions Precautions: Other (comment) Precaution Comments: watch SpO2 Restrictions Weight Bearing Restrictions: No      Mobility  Bed Mobility Overal bed mobility: Modified Independent             General bed mobility comments: HOB elevated  Transfers Overall transfer level: Independent Equipment used: None                Ambulation/Gait Ambulation/Gait assistance: Independent Gait Distance (Feet): 300 Feet Assistive device: None Gait Pattern/deviations: WFL(Within Functional Limits)   Gait velocity interpretation: 1.31 - 2.62 ft/sec, indicative of limited community Conservation officer, historic buildings [place] (Stroke Patients Only)       Balance Overall balance assessment: Independent                                           Pertinent Vitals/Pain Pain Assessment: No/denies pain    Home Living  Family/patient expects to be discharged to:: Private residence Living Arrangements: Spouse/significant other Available Help at Discharge: Family;Available 24 hours/day Type of Home: House Home Access: Stairs to enter Entrance Stairs-Rails: Right Entrance Stairs-Number of Steps: 2 Home Layout: Two level;Bed/bath upstairs Home Equipment: Shower seat Additional Comments: Husband at home recovering from [place], "but he'll still be able to help if I need it"    Prior Function Level of Independence: Independent         Comments: Enjoys gardening and walking     Hand Dominance        Extremity/Trunk Assessment   Upper Extremity Assessment Upper Extremity Assessment: Overall WFL for tasks assessed    Lower Extremity Assessment Lower Extremity Assessment: Overall WFL for tasks assessed    Cervical / Trunk Assessment Cervical / Trunk Assessment: Normal  Communication   Communication: No difficulties  Cognition Arousal/Alertness: Awake/alert Behavior During Therapy: WFL for tasks assessed/performed Overall Cognitive Status: Within Functional Limits for tasks assessed                                        General Comments      Exercises     Assessment/Plan    PT Assessment Patent does not need any further PT services  PT Problem List  PT Treatment Interventions      PT Goals (Current goals can be found in the Care Plan section)  Acute Rehab PT Goals PT Goal Formulation: All assessment and education complete, DC therapy    Frequency     Barriers to discharge        Co-evaluation               AM-PAC PT "6 Clicks" Mobility  Outcome Measure Help needed turning from your back to your side while in a flat bed without using bedrails?: None Help needed moving from lying on your back to sitting on the side of a flat bed without using bedrails?: None Help needed moving to and from a bed to a chair (including a wheelchair)?: None Help  needed standing up from a chair using your arms (e.g., wheelchair or bedside chair)?: None Help needed to walk in hospital room?: None Help needed climbing 3-5 steps with a railing? : None 6 Click Score: 24    End of Session Equipment Utilized During Treatment: Oxygen Activity Tolerance: Patient tolerated treatment well Patient left: in chair;with call bell/phone within reach Nurse Communication: Mobility status PT Visit Diagnosis: Other abnormalities of gait and mobility (R26.89)    Time: 0820-0850 PT Time Calculation (min) (ACUTE ONLY): 30 min   Charges:   PT Evaluation $PT Eval Moderate Complexity: 1 Mod PT Treatments $Therapeutic Exercise: 8-22 mins   Mabeline Caras, PT, DPT Acute Rehabilitation Services  Pager [phone] Office [place] 03/07/2020, 9:11 AM

## 2020-03-07 NOTE — Progress Notes (Signed)
Pt was ambulating in the hall. O2 2LNC in 92 most of the walk. Went to 89 while walking and talking. No signs of distress. Spirometer and flutter done approprietly.

## 2020-03-07 NOTE — Progress Notes (Signed)
Patient has been very anxious throughout the night regarding inability to sleep and alarms keeping her awake.  The patient was switched to a portable telemetry box that does not alarm in the room, and a bedside pulse oximeter.  Unfortunately, there was also a mechanical problem with her bed, and that started alarming as well, so her bed had to be switched out during the night.  At approximately 0600 this nurse came to the patient's room at the request of the RN Jaleah.  The patient was in tears, wanting to leave, stating "I can't sleep here, I can't get better if I can't sleep.  I normally take xanax to help me sleep and they would only give me melatonin.  I can finish my treatment outpatient so I will be able to rest better at home."    I offered a listening ear to the patient's concerns.  I attempted to reassure the patient that the attending physician would be making his rounds soon, and she could discuss these ideas with him, to see if outpatient treatment is an option.  I attempted to gently discourage the patient from leaving against medical advice, due to the likelihood of insurance not covering her stay if she did, and due to her ongoing oxygen requirement and need for remdesivir treatment.    The patient responded well to the reassurance, and stated that she was willing to stay until the attending arrives and she can discuss her plan of care with him.

## 2020-03-07 NOTE — Progress Notes (Signed)
SATURATION QUALIFICATIONS: (This note is used to comply with regulatory documentation for home oxygen)  Patient Saturations on Room Air at Rest = 90%  Patient Saturations on Room Air while Ambulating = 85%  Patient Saturations on 2 Liters of oxygen while Ambulating = 88%  Please briefly explain why patient needs home oxygen: Patient requires supplemental oxygen to maintain SpO2 >/88% with activity.  Mabeline Caras, PT, DPT Acute Rehabilitation Services  Pager [phone] Office [phone]

## 2020-03-08 ENCOUNTER — Ambulatory Visit (HOSPITAL_COMMUNITY): Payer: Medicare Other

## 2020-03-08 LAB — CBC WITH DIFFERENTIAL/PLATELET
Abs Immature Granulocytes: 0.05 10*3/uL (ref 0.00–0.07)
Basophils Absolute: 0 10*3/uL (ref 0.0–0.1)
Basophils Relative: 0 %
Eosinophils Absolute: 0 10*3/uL (ref 0.0–0.5)
Eosinophils Relative: 0 %
HCT: 37.3 % (ref 36.0–46.0)
Hemoglobin: 12.4 g/dL (ref 12.0–15.0)
Immature Granulocytes: 1 %
Lymphocytes Relative: 22 %
Lymphs Abs: 1.3 10*3/uL (ref 0.7–4.0)
MCH: 29.9 pg (ref 26.0–34.0)
MCHC: 33.2 g/dL (ref 30.0–36.0)
MCV: 89.9 fL (ref 80.0–100.0)
Monocytes Absolute: 0.8 10*3/uL (ref 0.1–1.0)
Monocytes Relative: 14 %
Neutro Abs: 3.8 10*3/uL (ref 1.7–7.7)
Neutrophils Relative %: 63 %
Platelets: 283 10*3/uL (ref 150–400)
RBC: 4.15 MIL/uL (ref 3.87–5.11)
RDW: 12.6 % (ref 11.5–15.5)
WBC: 6 10*3/uL (ref 4.0–10.5)
nRBC: 0 % (ref 0.0–0.2)

## 2020-03-08 LAB — COMPREHENSIVE METABOLIC PANEL
ALT: 30 U/L (ref 0–44)
AST: 29 U/L (ref 15–41)
Albumin: 3.1 g/dL — ABNORMAL LOW (ref 3.5–5.0)
Alkaline Phosphatase: 89 U/L (ref 38–126)
Anion gap: 8 (ref 5–15)
BUN: 13 mg/dL (ref 8–23)
CO2: 22 mmol/L (ref 22–32)
Calcium: 9.1 mg/dL (ref 8.9–10.3)
Chloride: 109 mmol/L (ref 98–111)
Creatinine, Ser: 0.66 mg/dL (ref 0.44–1.00)
GFR calc Af Amer: >60 mL/min (ref >60)
GFR calc non Af Amer: >60 mL/min (ref >60)
Glucose, Bld: 109 mg/dL — ABNORMAL HIGH (ref 70–99)
Potassium: 3.9 mmol/L (ref 3.5–5.1)
Sodium: 139 mmol/L (ref 135–145)
Total Bilirubin: 0.7 mg/dL (ref 0.3–1.2)
Total Protein: 6.3 g/dL — ABNORMAL LOW (ref 6.5–8.1)

## 2020-03-08 LAB — BRAIN NATRIURETIC PEPTIDE: B Natriuretic Peptide: 78.1 pg/mL (ref 0.0–100.0)

## 2020-03-08 LAB — MAGNESIUM: Magnesium: 2 mg/dL (ref 1.7–2.4)

## 2020-03-08 LAB — C-REACTIVE PROTEIN: CRP: 2.3 mg/dL — ABNORMAL HIGH (ref <1.0)

## 2020-03-08 LAB — D-DIMER, QUANTITATIVE: D-Dimer, Quant: 0.37 ug/mL-FEU (ref 0.00–0.50)

## 2020-03-08 MED ORDER — ALBUTEROL SULFATE HFA 108 (90 BASE) MCG/ACT IN AERS
2.0000 | INHALATION_SPRAY | Freq: Four times a day (QID) | RESPIRATORY_TRACT | 0 refills | Status: DC | PRN
Start: 2020-03-08 — End: 2020-04-02

## 2020-03-08 MED ORDER — METHYLPREDNISOLONE 4 MG PO TBPK: ORAL_TABLET | ORAL | 0 refills | Status: DC

## 2020-03-08 NOTE — Plan of Care (Signed)
Careplans met, pt discharged

## 2020-03-08 NOTE — Discharge Summary (Signed)
Patty [name]:[id number] DOB: 1956/02/24 DOA: 03/05/2020  PCP: Glendon Axe, MD  Admit date: 03/05/2020  Discharge date: 03/08/2020  Admitted From: Home  Disposition:  Home   Recommendations for Outpatient Follow-up:   Follow up with PCP in 1-2 weeks  PCP Please obtain BMP/CBC, 2 view CXR in 1week,  (see Discharge instructions)   PCP Please follow up on the following pending results:    Home Health: RN, PT Equipment/Devices: 2lit o2  Consultations: None  Discharge Condition: Stable    CODE STATUS: Full    Diet Recommendation: Heart Healthy   Diet Order            Diet regular Room service appropriate? Yes; Fluid consistency: Thin  Diet effective now                  Chief Complaint  Patient presents with   Cough     Brief history of present illness from the day of admission and additional interim summary    Patty C Huangis an 64 y.o.female who is vaccinated for Covidwith a PMH of Barrett's esophagus, anxiety, HTN, HLD, OSA who was admitted with a chief complaint of cough associated with shortness of breath, muscle aches, dizziness, and abdominal discomfort/diarrea. Symptoms began 5 days ago, upon arrival to the hospital she was diagnosed with acute hypoxic respiratory failure due to COVID-19 pneumonia and admitted to the hospital.                                                                 Hospital Course     1. Acute Hypoxic Resp. Failure due to Acute Covid 19 Viral Pneumonitis during the ongoing 2020 Covid 19 Pandemic - she is fully vaccinated and thankfully has mild to moderate disease, is on steroids and remdesivir combination with clinical improvement and stabilization.  Inflammatory markers are trending down, he feels very well and wants to be discharged home as she does not like the  hospital setting at all, she will be given Medrol Dosepak, she will finish her last short of remdesivir infusion at [place] [facility] on 03/09/2020, she will also get 2 L of nasal cannula oxygen to be used at home with close outpatient PCP follow-up post discharge.  SpO2: 95 % O2 Flow Rate (L/min): 2 L/min  Recent Labs  Lab 03/05/20 2104 03/06/20 1207 03/06/20 1208 03/07/20 0500 03/07/20 1120 03/08/20 0420  CRP 10.7*  --   --  5.8*  --  2.3*  DDIMER 0.43  --   --  0.42  --  0.37  FERRITIN 703*  --   --   --   --   --   BNP  --  180.0*  --   --  107.4* 78.1  PROCALCITON <0.10  --  <0.10  --   --   --  Hepatic Function Latest Ref Rng & Units 03/08/2020 03/07/2020 03/05/2020  Total Protein 6.5 - 8.1 g/dL 6.3(L) 6.9 6.7  Albumin 3.5 - 5.0 g/dL 3.1(L) 3.4(L) 3.6  AST 15 - 41 U/L 29 33 39  ALT 0 - 44 U/L 30 30 35  Alk Phosphatase 38 - 126 U/L 89 92 100  Total Bilirubin 0.3 - 1.2 mg/dL 0.7 0.5 0.6  Bilirubin, Direct 0.0 - 0.3 mg/dL - - -    2.  Insomnia.  Continue home regimen.  3.  Reflux.    Resolved after Pepto-Bismol.     Discharge diagnosis     Principal Problem:   Acute respiratory failure due to COVID-19 [facility]) Active Problems:   Pneumonia due to COVID-19 virus   HTN (hypertension)   Hyperlipidemia   Thrombocytopenia [facility])    Discharge instructions    Discharge Instructions    Discharge instructions   Complete by: As directed    Follow with Primary MD Glendon Axe, MD in 7 days   Get CBC, CMP, 2 view Chest X ray -  checked next visit within 1 week by Primary MD   Activity: As tolerated with Full fall precautions use walker/cane & assistance as needed  Disposition Home   Diet: Heart Healthy   Special Instructions: If you have smoked or chewed Tobacco  in the last 2 yrs please stop smoking, stop any regular Alcohol  and or any Recreational drug use.  On your next visit with your primary care physician please Get Medicines reviewed and adjusted.  Please  request your Prim.MD to go over all Hospital Tests and Procedure/Radiological results at the follow up, please get all Hospital records sent to your Prim MD by signing hospital release before you go home.  If you experience worsening of your admission symptoms, develop shortness of breath, life threatening emergency, suicidal or homicidal thoughts you must seek medical attention immediately by calling 911 or calling your MD immediately  if symptoms less severe.  You Must read complete instructions/literature along with all the possible adverse reactions/side effects for all the Medicines you take and that have been prescribed to you. Take any new Medicines after you have completely understood and accpet all the possible adverse reactions/side effects.   Increase activity slowly   Complete by: As directed    MyChart COVID-19 home monitoring program   Complete by: Mar 08, 2020    Is the patient willing to use the [place] for home monitoring?: Yes   Temperature monitoring   Complete by: Mar 08, 2020    After how many days would you like to receive a notification of this patient's flowsheet entries?: 1      Discharge Medications   Allergies as of 03/08/2020   No Known Allergies     Medication List    STOP taking these medications   ibuprofen 200 MG tablet Commonly known as: ADVIL     TAKE these medications   acetaminophen 325 MG tablet Commonly known as: TYLENOL Take 325-650 mg by mouth every 6 (six) hours as needed for mild pain, fever or headache.   albuterol 108 (90 Base) MCG/ACT inhaler Commonly known as: VENTOLIN HFA Inhale 2 puffs into the lungs every 6 (six) hours as needed for wheezing or shortness of breath.   ALPRAZolam 0.5 MG tablet Commonly known as: XANAX Take 1 mg by mouth at bedtime.   bismuth subsalicylate 144 MG chewable tablet Commonly known as: PEPTO BISMOL Chew 252-524 mg by mouth as needed  for indigestion or diarrhea or loose stools.    diphenhydramine-acetaminophen 25-500 MG Tabs tablet Commonly known as: TYLENOL PM Take 2 tablets by mouth at bedtime.   melatonin 5 MG Tabs Take 5 mg by mouth at bedtime.   methylPREDNISolone 4 MG Tbpk tablet Commonly known as: MEDROL DOSEPAK follow package directions            Durable Medical Equipment  (From admission, onward)         Start     Ordered   03/07/20 1359  For home use only DME oxygen  Once       Question Answer Comment  Length of Need 6 Months   Mode or (Route) Nasal cannula   Liters per Minute 2   Frequency Continuous (stationary and portable oxygen unit needed)   Oxygen delivery system Gas      03/07/20 1359           Follow-up Information    Caffie Damme, MD. Schedule an appointment as soon as possible for a visit in 1 week(s).   Specialty: Family Medicine Contact information: [address] [place] [address] [phone]               Major procedures and Radiology Reports - PLEASE review detailed and final reports thoroughly  -       DG Chest Portable 1 View  Result Date: 03/05/2020 CLINICAL DATA:  Shortness of breath, COVID positive. EXAM: PORTABLE CHEST 1 VIEW COMPARISON:  None. FINDINGS: The heart size and mediastinal contours are within normal limits. Minimal airspace opacities are seen in the right upper lung and the left lower lung. There is no pleural effusion or pneumothorax. The visualized skeletal structures are unremarkable. IMPRESSION: Minimal airspace opacities in the right upper lung and left lower lung may reflect COVID-19 pneumonia. Electronically Signed   By: Romona Curls M.D.   On: 03/05/2020 13:10    Micro Results     Recent Results (from the past 240 hour(s))  Blood Culture (routine x 2)     Status: None (Preliminary result)   Collection Time: 03/05/20  9:11 PM   Specimen: BLOOD  Result Value Ref Range Status   Specimen Description   Final    BLOOD RIGHT ANTECUBITAL Performed at [facility]  Lab, [address], [place], [place] [address]    Special Requests   Final    BOTTLES DRAWN AEROBIC AND ANAEROBIC Blood Culture adequate volume Performed at [facility], [address] [place], [place] [address]    Culture   Final    NO GROWTH 3 DAYS Performed at [facility], [address], [place], [place] [address]    Report Status PENDING  Incomplete  Blood Culture (routine x 2)     Status: None (Preliminary result)   Collection Time: 03/05/20  9:42 PM   Specimen: BLOOD  Result Value Ref Range Status   Specimen Description   Final    BLOOD BLOOD LEFT HAND Performed at [facility], [address] [place], [place] [address]    Special Requests   Final    BOTTLES DRAWN AEROBIC AND ANAEROBIC Blood Culture adequate volume Performed at [facility], [address] [place], [place] [address]    Culture   Final    NO GROWTH 3 DAYS Performed at [facility], [address] [place], [place] [address]    Report Status PENDING  Incomplete  Today   Subjective    Sherley Mckenney today has no headache,no chest abdominal pain,no new weakness tingling or numbness, feels much better wants to go home today.    Objective   Blood pressure 121/71, pulse 69, temperature 98.2 F (36.8 C), temperature source Oral, resp. rate 16, height $RemoveBe'5\' 1"'iojpJSFer$  (1.549 m), weight 64 kg, last menstrual period 07/06/2008, SpO2 95 %.   Intake/Output Summary (Last 24 hours) at 03/08/2020 1001 Last data filed at 03/08/2020 0453 Gross per 24 hour  Intake 480 ml  Output --  Net 480 ml    Exam  Awake Alert, No new F.N deficits, Normal affect [place].AT,PERRAL Supple Neck,No JVD, No cervical lymphadenopathy appriciated.  Symmetrical Chest wall movement, Good air movement bilaterally, CTAB RRR,No Gallops,Rubs or new Murmurs, No Parasternal Heave +ve B.Sounds, Abd Soft, Non tender, No organomegaly appriciated, No rebound -guarding or rigidity. No Cyanosis, Clubbing or  edema, No new Rash or bruise   Data Review   CBC w Diff:  Lab Results  Component Value Date   WBC 6.0 03/08/2020   HGB 12.4 03/08/2020   HCT 37.3 03/08/2020   PLT 283 03/08/2020   LYMPHOPCT 22 03/08/2020   MONOPCT 14 03/08/2020   EOSPCT 0 03/08/2020   BASOPCT 0 03/08/2020    CMP:  Lab Results  Component Value Date   NA 139 03/08/2020   K 3.9 03/08/2020   CL 109 03/08/2020   CO2 22 03/08/2020   BUN 13 03/08/2020   CREATININE 0.66 03/08/2020   PROT 6.3 (L) 03/08/2020   ALBUMIN 3.1 (L) 03/08/2020   BILITOT 0.7 03/08/2020   ALKPHOS 89 03/08/2020   AST 29 03/08/2020   ALT 30 03/08/2020  .   Total Time in preparing paper work, data evaluation and todays exam - 89 minutes  Lala Lund M.D on 03/08/2020 at 10:01 AM  [facility]   Office  [phone]

## 2020-03-08 NOTE — TOC Transition Note (Signed)
Transition of Care (TOC) - CM/SW Discharge Note Patty Gibbons RN, BSN Transitions of Care Unit 4E- RN Case Manager See Treatment Team for direct phone # Cross Coverage for 5W    Patient Details  Name: Patty [name] MRN: [id number] Date of Birth: [birth year]  Transition [facility] Care [facility]) CM/SW Contact:  Patty Patricia, RN Phone Number: 03/08/2020, 2:28 PM   Clinical Narrative:    Pt stable for transition home, as per previous TOC note home 02 has been arranged with [employer]- spoke with Patty [name] who states that portable equipment was delivered to the unit yesterday Patty [name] signed for it at the nurses desk. Bedside RN to look at desk for equipment for pt's transport home.   Noted [address] orders placed for HHRN/PT- call made to pt's room to discuss- pt agreeable to RN but states PT told her yesterday she did not need PT and this was noted in their documented note. Choice offered to pt for [name]- pt voices she does not have a preference.  Call made to [facility] with [facility] for [facility] referral- referral has been accepted.   Final next level of care: [place] Barriers to Discharge: Barriers Resolved   Patient Goals and CMS Choice Patient states their goals for this hospitalization and ongoing recovery are:: return home CMS Medicare.gov Compare Post Acute Care list provided to:: Patient Choice offered to / list presented to : Patient  Discharge Placement                Home with [facility]      Discharge Plan and Services   Discharge Planning Services: CM Consult Post Acute Care Choice: Durable Medical Equipment, Home Health          DME Arranged: Oxygen DME [name] Date DME [name] Contacted: 03/07/20   Representative spoke with at DME [name]: Patty [name] HH Arranged: RN, PT Patty [name]: [place] Date [place]: 03/08/20 Time [place] [name] Contacted: 1100 Representative spoke with at [place]: [place] ([place])  Interventions     Readmission Risk Interventions Readmission Risk Prevention Plan 03/08/2020  Post Dischage Appt Complete  Medication Screening Complete  Transportation Screening Complete  Some recent data might be hidden

## 2020-03-08 NOTE — Progress Notes (Signed)
SATURATION QUALIFICATIONS: (This note is used to comply with regulatory documentation for home oxygen)  Patient Saturations on Room Air at Rest = 88%  Patient Saturations on Room Air while Ambulating = 83%  Patient Saturations on 2 Liters of oxygen while Ambulating = 91%

## 2020-03-08 NOTE — Discharge Instructions (Signed)
Patient scheduled for outpatient Remdesivir infusions at 9am on  Saturday 9/4 at [facility]. Please inform the patient to park at [place], as staff will be escorting the patient through the [place] entrance of the hospital. Appointments will take approximately 45 minutes.    There is a wave flag banner located near the entrance on [address]. Turn into this entrance and immediately turn left and park in 1 of the 5 designated Covid Infusion Parking spots. There is a phone number on the sign, please call and let the staff know what spot you are in and we will come out and get you. For questions call [phone].  Thanks.  Follow with Primary MD Glendon Axe, MD in 7 days   Get CBC, CMP, 2 view Chest X ray -  checked next visit within 1 week by Primary MD   Activity: As tolerated with Full fall precautions use walker/cane & assistance as needed  Disposition Home   Diet: Heart Healthy   Special Instructions: If you have smoked or chewed Tobacco  in the last 2 yrs please stop smoking, stop any regular Alcohol  and or any Recreational drug use.  On your next visit with your primary care physician please Get Medicines reviewed and adjusted.  Please request your Prim.MD to go over all Hospital Tests and Procedure/Radiological results at the follow up, please get all Hospital records sent to your Prim MD by signing hospital release before you go home.  If you experience worsening of your admission symptoms, develop shortness of breath, life threatening emergency, suicidal or homicidal thoughts you must seek medical attention immediately by calling 911 or calling your MD immediately  if symptoms less severe.  You Must read complete instructions/literature along with all the possible adverse reactions/side effects for all the Medicines you take and that have been prescribed to you. Take any new Medicines after you have completely understood and accpet all the possible adverse  reactions/side effects.          Person Under Monitoring Name: Patty [name]  Location: [place] [address]   Infection Prevention Recommendations for Individuals Confirmed to have, or Being Evaluated for, 2019 Novel Coronavirus (COVID-19) Infection Who Receive Care at Home  Individuals who are confirmed to have, or are being evaluated for, COVID-19 should follow the prevention steps below until a healthcare provider or local or state health department says they can return to normal activities.  Stay home except to get medical care You should restrict activities outside your home, except for getting medical care. Do not go to work, school, or public areas, and do not use public transportation or taxis.  Call ahead before visiting your doctor Before your medical appointment, call the healthcare provider and tell them that you have, or are being evaluated for, COVID-19 infection. This will help the healthcare provider's office take steps to keep other people from getting infected. Ask your healthcare provider to call the local or state health department.  Monitor your symptoms Seek prompt medical attention if your illness is worsening (e.g., difficulty breathing). Before going to your medical appointment, call the healthcare provider and tell them that you have, or are being evaluated for, COVID-19 infection. Ask your healthcare provider to call the local or state health department.  Wear a facemask You should wear a facemask that covers your nose and mouth when you are in the same room with other people and when you visit a  healthcare provider. People who live with or visit you should also wear a facemask while they are in the same room with you.  Separate yourself from other people in your home As much as possible, you should stay in a different room from other people in your home. Also, you should use a separate bathroom, if available.  Avoid  sharing household items You should not share dishes, drinking glasses, cups, eating utensils, towels, bedding, or other items with other people in your home. After using these items, you should wash them thoroughly with soap and water.  Cover your coughs and sneezes Cover your mouth and nose with a tissue when you cough or sneeze, or you can cough or sneeze into your sleeve. Throw used tissues in a lined trash can, and immediately wash your hands with soap and water for at least 20 seconds or use an alcohol-based hand rub.  Wash your [facility] your hands often and thoroughly with soap and water for at least 20 seconds. You can use an alcohol-based hand sanitizer if soap and water are not available and if your hands are not visibly dirty. Avoid touching your eyes, nose, and mouth with unwashed hands.   Prevention Steps for Caregivers and Household Members of Individuals Confirmed to have, or Being Evaluated for, COVID-19 Infection Being Cared for in the Home  If you live with, or provide care at home for, a person confirmed to have, or being evaluated for, COVID-19 infection please follow these guidelines to prevent infection:  Follow healthcare provider's instructions Make sure that you understand and can help the patient follow any healthcare provider instructions for all care.  Provide for the patient's basic needs You should help the patient with basic needs in the home and provide support for getting groceries, prescriptions, and other personal needs.  Monitor the patient's symptoms If they are getting sicker, call his or her medical provider and tell them that the patient has, or is being evaluated for, COVID-19 infection. This will help the healthcare provider's office take steps to keep other people from getting infected. Ask the healthcare provider to call the local or state health department.  Limit the number of people who have contact with the patient  If possible, have  only one caregiver for the patient.  Other household members should stay in another home or place of residence. If this is not possible, they should stay  in another room, or be separated from the patient as much as possible. Use a separate bathroom, if available.  Restrict visitors who do not have an essential need to be in the home.  Keep older adults, very young children, and other sick people away from the patient Keep older adults, very young children, and those who have compromised immune systems or chronic health conditions away from the patient. This includes people with chronic heart, lung, or kidney conditions, diabetes, and cancer.  Ensure good ventilation Make sure that shared spaces in the home have good air flow, such as from an air conditioner or an opened window, weather permitting.  Wash your hands often  Wash your hands often and thoroughly with soap and water for at least 20 seconds. You can use an alcohol based hand sanitizer if soap and water are not available and if your hands are not visibly dirty.  Avoid touching your eyes, nose, and mouth with unwashed hands.  Use disposable paper towels to dry your hands. If not available, use dedicated cloth towels and replace them  when they become wet.  Wear a facemask and gloves  Wear a disposable facemask at all times in the room and gloves when you touch or have contact with the patient's blood, body fluids, and/or secretions or excretions, such as sweat, saliva, sputum, nasal mucus, vomit, urine, or feces.  Ensure the mask fits over your nose and mouth tightly, and do not touch it during use.  Throw out disposable facemasks and gloves after using them. Do not reuse.  Wash your hands immediately after removing your facemask and gloves.  If your personal clothing becomes contaminated, carefully remove clothing and launder. Wash your hands after handling contaminated clothing.  Place all used disposable facemasks, gloves,  and other waste in a lined container before disposing them with other household waste.  Remove gloves and wash your hands immediately after handling these items.  Do not share dishes, glasses, or other household items with the patient  Avoid sharing household items. You should not share dishes, drinking glasses, cups, eating utensils, towels, bedding, or other items with a patient who is confirmed to have, or being evaluated for, COVID-19 infection.  After the person uses these items, you should wash them thoroughly with soap and water.  Wash laundry thoroughly  Immediately remove and wash clothes or bedding that have blood, body fluids, and/or secretions or excretions, such as sweat, saliva, sputum, nasal mucus, vomit, urine, or feces, on them.  Wear gloves when handling laundry from the patient.  Read and follow directions on labels of laundry or clothing items and detergent. In general, wash and dry with the warmest temperatures recommended on the label.  Clean all areas the individual has used often  Clean all touchable surfaces, such as counters, tabletops, doorknobs, bathroom fixtures, toilets, phones, keyboards, tablets, and bedside tables, every day. Also, clean any surfaces that may have blood, body fluids, and/or secretions or excretions on them.  Wear gloves when cleaning surfaces the patient has come in contact with.  Use a diluted bleach solution (e.g., dilute bleach with 1 part bleach and 10 parts water) or a household disinfectant with a label that says EPA-registered for coronaviruses. To make a bleach solution at home, add 1 tablespoon of bleach to 1 quart (4 cups) of water. For a larger supply, add  cup of bleach to 1 gallon (16 cups) of water.  Read labels of cleaning products and follow recommendations provided on product labels. Labels contain instructions for safe and effective use of the cleaning product including precautions you should take when applying the  product, such as wearing gloves or eye protection and making sure you have good ventilation during use of the product.  Remove gloves and wash hands immediately after cleaning.  Monitor yourself for signs and symptoms of illness Caregivers and household members are considered close contacts, should monitor their health, and will be asked to limit movement outside of the home to the extent possible. Follow the monitoring steps for close contacts listed on the symptom monitoring form.   ? If you have additional questions, contact your local health department or call the epidemiologist on call at [phone] (available 24/7). ? This guidance is subject to change. For the most up-to-date guidance from [facility], please refer to their website: [email or web address]

## 2020-03-08 NOTE — Evaluation (Addendum)
Occupational Therapy Evaluation Patient Details Name: Patty [name] MRN: [id number] DOB: 11-06-1955 Today's Date: 03/08/2020    History of Present Illness Pt is a 64 y.o. female who tested (+) COVID-19 on 02/28/20, now admitted 03/05/20 with SOB, cough, muscle aches, abdominal discomfort; workup for acute hypoxic respiratory failure due to COVID-19. PMH includes HTN, OSA, osteoporosis, anxiety. Pt vaccinated 10/2019.   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 Pt currently performing ADLs and functional mobility at Mod I level with increased time and rest breaks as needed. Providing pt with education on [facility] for ADLs and IADLs; pt verbalized understanding. Performing mobility in hallway and requiring seated rest break and purse lip breathing for SpO2 management.  Pt very appreciative of information. Recommend dc to home once medically stable per physician. All acute OT needs met and will sign off.  SpO2 95-86% on 2L. HR 70-80s.    Follow Up Recommendations  No OT follow up    Equipment Recommendations  None recommended by OT    Recommendations for Other Services       Precautions / Restrictions Precautions Precautions: Other (comment) Precaution Comments: watch SpO2      Mobility Bed Mobility               General bed mobility comments: In recliner upon arrival  Transfers Overall transfer level: Independent                    Balance Overall balance assessment: Independent                                         ADL either performed or assessed with clinical judgement   ADL Overall ADL's : Modified independent                                       General ADL Comments: Pt performing ADLs and functional mobility at Mod I level. Pt requiring increased time and rest breaks. Provided handout and education on [facility] for ADLs and IADLs. Pt very appreciative of  information.      Vision         Perception     Praxis      Pertinent Vitals/Pain Pain Assessment: No/denies pain     Hand Dominance Right   Extremity/Trunk Assessment Upper Extremity Assessment Upper Extremity Assessment: Overall WFL for tasks assessed   Lower Extremity Assessment Lower Extremity Assessment: Overall WFL for tasks assessed   Cervical / Trunk Assessment Cervical / Trunk Assessment: Normal   Communication Communication Communication: No difficulties   Cognition Arousal/Alertness: Awake/alert Behavior During Therapy: WFL for tasks assessed/performed Overall Cognitive Status: Within Functional Limits for tasks assessed                                     General Comments  SpO dropping to 84% on RA during activity. SpO2 95-86% on 2L. HR 70-80s.    Exercises Exercises: Other exercises Other Exercises Other Exercises: Reviewed IS and flutter valve; 4x each. Pulled 1055ml on IS Other Exercises: Educating on prone positioning and the benefits   Shoulder Instructions      Home Living  Family/patient expects to be discharged to:: Private residence Living Arrangements: Spouse/significant other Available Help at Discharge: Family;Available 24 hours/day Type of Home: House Home Access: Stairs to enter [employer] of Steps: 2 Entrance Stairs-Rails: Right Home Layout: Two level;Bed/bath upstairs Alternate Level Stairs-Number of Steps: Flight Alternate Level Stairs-Rails: Right Bathroom Shower/Tub: Tub/shower unit;Walk-in Psychologist, prison and probation services: Standard     Home Equipment: Shower seat;Hand held shower head   Additional Comments: Husband at home recovering from [place], "but he'll still be able to help if I need it"      Prior Functioning/Environment Level of Independence: Independent        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 OT Goal Formulation: All assessment and education complete, DC therapy  OT Frequency:     Barriers to D/C:            Co-evaluation              AM-PAC OT "6 Clicks" Daily Activity     Outcome Measure Help from another person eating meals?: None Help from another person taking care of personal grooming?: None Help from another person toileting, which includes using toliet, bedpan, or urinal?: None Help from another person bathing (including washing, rinsing, drying)?: None Help from another person to put on and taking off regular upper body clothing?: None Help from another person to put on and taking off regular lower body clothing?: None 6 Click Score: 24   End of Session Equipment Utilized During Treatment: Oxygen (2L) Nurse Communication: Mobility status  Activity Tolerance: Patient tolerated treatment well Patient left: in chair;with call bell/phone within reach  OT Visit Diagnosis: Other abnormalities of gait and mobility (R26.89);Muscle weakness (generalized) (M62.81)                Time: 3299-2426 OT Time Calculation (min): 35 min Charges:  OT General Charges $OT Visit: 1 Visit OT Evaluation $OT Eval Low Complexity: 1 Low OT Treatments $Self Care/Home Management : 8-22 mins  Mukesh Kornegay MSOT, OTR/L Acute Rehab Pager: [phone] Office: [place] 03/08/2020, 9:02 AM

## 2020-03-09 ENCOUNTER — Ambulatory Visit (HOSPITAL_COMMUNITY)
Admit: 2020-03-09 | Discharge: 2020-03-09 | Disposition: A | Discharge status: Home / Self Care | Payer: Medicare Other | Source: Ambulatory Visit | Attending: Pulmonary Disease | Admitting: Pulmonary Disease

## 2020-03-09 DIAGNOSIS — U071 COVID-19: Secondary | ICD-10-CM | POA: Insufficient documentation

## 2020-03-09 DIAGNOSIS — J1289 Other viral pneumonia: Secondary | ICD-10-CM | POA: Insufficient documentation

## 2020-03-09 MED ORDER — SODIUM CHLORIDE 0.9 % IV SOLN
100.0000 mg | Freq: Once | INTRAVENOUS | Status: AC
  Administered 2020-03-09: 100 mg via INTRAVENOUS
  Filled 2020-03-09: qty 20

## 2020-03-09 MED ORDER — EPINEPHRINE 0.3 MG/0.3ML IJ SOAJ: 0.3000 mg | Freq: Once | INTRAMUSCULAR | Status: DC | PRN

## 2020-03-09 MED ORDER — DIPHENHYDRAMINE HCL 50 MG/ML IJ SOLN: 50.0000 mg | Freq: Once | INTRAMUSCULAR | Status: DC | PRN

## 2020-03-09 MED ORDER — FAMOTIDINE IN NACL 20-0.9 MG/50ML-% IV SOLN: 20.0000 mg | Freq: Once | INTRAVENOUS | Status: DC | PRN

## 2020-03-09 MED ORDER — SODIUM CHLORIDE 0.9 % IV SOLN: INTRAVENOUS | Status: DC | PRN

## 2020-03-09 MED ORDER — ALBUTEROL SULFATE HFA 108 (90 BASE) MCG/ACT IN AERS: 2.0000 | INHALATION_SPRAY | Freq: Once | RESPIRATORY_TRACT | Status: DC | PRN

## 2020-03-09 MED ORDER — METHYLPREDNISOLONE SODIUM SUCC 125 MG IJ SOLR: 125.0000 mg | Freq: Once | INTRAMUSCULAR | Status: DC | PRN

## 2020-03-09 NOTE — Progress Notes (Signed)
  Diagnosis: COVID-19  Physician: Dr Joya Gaskins  Procedure: Covid Infusion Clinic Med: remdesivir infusion - Provided patient with remdesivir fact sheet for patients, parents and caregivers prior to infusion.  Complications: No immediate complications noted.  Discharge: Discharged home   Patty [name] 03/09/2020

## 2020-03-09 NOTE — Discharge Instructions (Signed)
10 Things You Can Do to Manage Your COVID-19 Symptoms at Home If you have possible or confirmed COVID-19: 1. Stay home from work and school. And stay away from other public places. If you must go out, avoid using any kind of public transportation, ridesharing, or taxis. 2. Monitor your symptoms carefully. If your symptoms get worse, call your healthcare provider immediately. 3. Get rest and stay hydrated. 4. If you have a medical appointment, call the healthcare provider ahead of time and tell them that you have or may have COVID-19. 5. For medical emergencies, call 911 and notify the dispatch personnel that you have or may have COVID-19. 6. Cover your cough and sneezes with a tissue or use the inside of your elbow. 7. Wash your hands often with soap and water for at least 20 seconds or clean your hands with an alcohol-based hand sanitizer that contains at least 60% alcohol. 8. As much as possible, stay in a specific room and away from other people in your home. Also, you should use a separate bathroom, if available. If you need to be around other people in or outside of the home, wear a mask. 9. Avoid sharing personal items with other people in your household, like dishes, towels, and bedding. 10. Clean all surfaces that are touched often, like counters, tabletops, and doorknobs. Use household cleaning sprays or wipes according to the label instructions. cdc.gov/coronavirus 01/04/2019 This information is not intended to replace advice given to you by your health care provider. Make sure you discuss any questions you have with your health care provider. Document Revised: 06/08/2019 Document Reviewed: 06/08/2019 Elsevier Patient Education  2020 [employer]  

## 2020-03-10 ENCOUNTER — Ambulatory Visit (HOSPITAL_COMMUNITY): Payer: Medicare Other

## 2020-03-10 LAB — CULTURE, BLOOD (ROUTINE X 2)
Culture: NO GROWTH
Culture: NO GROWTH
Special Requests: ADEQUATE
Special Requests: ADEQUATE

## 2020-03-18 ENCOUNTER — Other Ambulatory Visit: Payer: Self-pay

## 2020-03-18 ENCOUNTER — Ambulatory Visit (INDEPENDENT_AMBULATORY_CARE_PROVIDER_SITE_OTHER): Payer: Medicare Other | Admitting: Physician Assistant

## 2020-03-18 ENCOUNTER — Encounter: Payer: Self-pay | Admitting: Physician Assistant

## 2020-03-18 ENCOUNTER — Telehealth: Payer: Self-pay | Admitting: Family Medicine

## 2020-03-18 ENCOUNTER — Ambulatory Visit (INDEPENDENT_AMBULATORY_CARE_PROVIDER_SITE_OTHER): Payer: Medicare Other

## 2020-03-18 VITALS — BP 125/74 | HR 86 | Temp 97.8°F | Ht 61.0 in | Wt 140.0 lb

## 2020-03-18 DIAGNOSIS — J9601 Acute respiratory failure with hypoxia: Secondary | ICD-10-CM

## 2020-03-18 DIAGNOSIS — R52 Pain, unspecified: Secondary | ICD-10-CM

## 2020-03-18 DIAGNOSIS — Z8616 Personal history of COVID-19: Secondary | ICD-10-CM

## 2020-03-18 DIAGNOSIS — Z8709 Personal history of other diseases of the respiratory system: Secondary | ICD-10-CM | POA: Insufficient documentation

## 2020-03-18 DIAGNOSIS — R350 Frequency of micturition: Secondary | ICD-10-CM | POA: Diagnosis not present

## 2020-03-18 LAB — POCT URINALYSIS DIP (CLINITEK)
Bilirubin, UA: NEGATIVE
Blood, UA: NEGATIVE
Glucose, UA: NEGATIVE mg/dL
Ketones, POC UA: NEGATIVE mg/dL
Leukocytes, UA: NEGATIVE
Nitrite, UA: NEGATIVE
POC PROTEIN,UA: NEGATIVE
Spec Grav, UA: 1.01 (ref 1.010–1.025)
Urobilinogen, UA: 0.2 E.U./dL
pH, UA: 7 (ref 5.0–8.0)

## 2020-03-18 NOTE — Progress Notes (Signed)
New Patient Office Visit  Subjective:  Patient ID: Patty [name], female    DOB: 01-Apr-1956  Age: 64 y.o. MRN: [id number]  CC:  Chief Complaint  Patient presents with  . Establish Care    HPI Patty [name] presents to establish care and follow up after ED admission for Covid pneumonia and acute respiratory failure.  She has HTN, GERD, and hx of thrombocytopenia.     She was admitted on 03/05/2020 and discharged on 03/08/2020. Pt was vaccinated. See discharge note.   1. Acute Hypoxic Resp. Failure due to Acute Covid 19 Viral Pneumonitis during the ongoing 2020 Covid 19 Pandemic -sheis fully vaccinated and thankfully has mild to moderate disease, is on steroids and remdesivir combination with clinical improvement and stabilization. Inflammatory markers are trending down, he feels very well and wants to be discharged home as she does not like the hospital setting at all, she will be given Medrol Dosepak, she will finish her last short of remdesivir infusion at [facility] [facility] on 03/09/2020, she will also get 2 L of nasal cannula oxygen to be used at home with close outpatient PCP follow-up post discharge.  She is feeling better. Her breathing is improving. She is using 2L of O2 most of the time and oxgyen stats running 98-100. If she does not use O2 then she is around 90 percent at home. She is working with home health on lung PT. Her biggest complaint is body aches. Her legs ache, her back aches. She is very tired as well as can easily get SOB when walking up stairs. She is urinating a lot. She urinating more frequently and more urgently. No urine odor. She has increased her hydration quite a bit.   Past Medical History:  Diagnosis Date  . Anxiety   . Barrett's esophagus   . Cardiomegaly   . Dyspareunia   . Fatty pancreas   . Gastric ulcer   . GERD (gastroesophageal reflux disease)   . Hx of colonic polyps   . Hyperlipidemia   . Hypertension   . Osteopenia   . Osteoporosis   .  Shingles   . Sleep apnea   . Thyroid nodule   . Uterine leiomyoma     Past Surgical History:  Procedure Laterality Date  . BIOPSY THYROID    . COLONOSCOPY W/ POLYPECTOMY    . TUBAL LIGATION      Family History  Problem Relation Age of Onset  . Hypertension Mother   . Heart disease Mother   . Heart disease Father   . Hypertension Brother     Social History   Socioeconomic History  . Marital status: Married    Spouse name: Not on file  . Number of children: Not on file  . Years of education: Not on file  . Highest education level: Not on file  Occupational History  . Not on file  Tobacco Use  . Smoking status: Never Smoker  . Smokeless tobacco: Never Used  Vaping Use  . Vaping Use: Never used  Substance and Sexual Activity  . Alcohol use: No    Alcohol/week: 0.0 standard drinks  . Drug use: No  . Sexual activity: Yes    Partners: Male    Birth control/protection: Surgical    Comment: BTL  Other Topics Concern  . Not on file  Social History Narrative  . Not on file   Social Determinants of Health   Financial Resource Strain:   . Difficulty of Paying Living  Expenses: Not on file  Food Insecurity:   . Worried About Charity fundraiser in the Last Year: Not on file  . Ran Out of Food in the Last Year: Not on file  Transportation Needs:   . Lack of Transportation (Medical): Not on file  . Lack of Transportation (Non-Medical): Not on file  Physical Activity:   . Days of Exercise per Week: Not on file  . Minutes of Exercise per Session: Not on file  Stress:   . Feeling of Stress : Not on file  Social Connections:   . Frequency of Communication with Friends and Family: Not on file  . Frequency of Social Gatherings with Friends and Family: Not on file  . Attends Religious Services: Not on file  . Active Member of Clubs or Organizations: Not on file  . Attends Archivist Meetings: Not on file  . Marital Status: Not on file  Intimate Partner  Violence:   . Fear of Current or Ex-Partner: Not on file  . Emotionally Abused: Not on file  . Physically Abused: Not on file  . Sexually Abused: Not on file    ROS Review of Systems  Constitutional: Positive for activity change and fatigue.  Respiratory: Positive for shortness of breath. Negative for cough, chest tightness and wheezing.   Cardiovascular: Negative for chest pain, palpitations and leg swelling.  Genitourinary: Positive for frequency and urgency.  Neurological: Negative for dizziness and headaches.  Hematological: Does not bruise/bleed easily.  Psychiatric/Behavioral: Negative.     Objective:   Today's Vitals: BP 125/74   Pulse 86   Temp 97.8 F (36.6 C) (Oral)   Ht 5\' 1"  (1.549 m)   Wt 140 lb (63.5 kg)   LMP 07/06/2008   SpO2 99% Comment: 2L  BMI 26.45 kg/m   Physical Exam Vitals reviewed.  Constitutional:      Appearance: Normal appearance.     Comments: On 2L continuous O2.   HENT:     Head: Normocephalic.  Neck:     Vascular: No carotid bruit.  Cardiovascular:     Rate and Rhythm: Normal rate and regular rhythm.     Pulses: Normal pulses.  Pulmonary:     Effort: Pulmonary effort is normal. No respiratory distress.     Breath sounds: Normal breath sounds. No wheezing, rhonchi or rales.     Comments: No labored breathing on O2.  Chest:     Chest wall: No tenderness.  Abdominal:     General: Bowel sounds are normal. There is no distension.     Palpations: Abdomen is soft.     Tenderness: There is no abdominal tenderness. There is no right CVA tenderness, left CVA tenderness, guarding or rebound.  Musculoskeletal:     Right lower leg: No edema.     Left lower leg: No edema.     Comments: Bilateral negative homans sign.  No tenderness to palpation over calf.  No lower extremity edema.   Lymphadenopathy:     Cervical: No cervical adenopathy.  Skin:    Findings: No bruising or erythema.  Neurological:     General: No focal deficit present.      Mental Status: She is alert and oriented to person, place, and time.     Cranial Nerves: No cranial nerve deficit.     Motor: No weakness.     Gait: Gait normal.  Psychiatric:        Mood and Affect: Mood normal.  Behavior: Behavior normal.     Assessment & Plan:  Marland KitchenMarland KitchenRhilynn was seen today for establish care.  Diagnoses and all orders for this visit:  Acute respiratory failure with hypoxia (HCC) -     DG Chest 2 View -     BASIC METABOLIC PANEL WITH GFR -     CBC with Differential/Platelet -     Magnesium  History of COVID-19 -     DG Chest 2 View -     BASIC METABOLIC PANEL WITH GFR -     CBC with Differential/Platelet -     Magnesium  Urinary frequency -     BASIC METABOLIC PANEL WITH GFR -     POCT URINALYSIS DIP (CLINITEK) -     Urine Culture  Body aches -     DG Chest 2 View -     BASIC METABOLIC PANEL WITH GFR -     CBC with Differential/Platelet -     Magnesium   Pt does seem to be improving. Her O2 stats on 2L are great. She continues to have some body aches over her whole body that concern her and she is very tired and easily short of breath. No leg swelling/pain/negative homans sign/no tachycardia and O2 stats improving low risk of DVT/PE. Dimers through hospital visit were normal. Will recheck BMP/CXR/CBC as well as add magnesium since she is aching more. She is only using tylenol right now but will check kidney function to see if she can add NSAID for body aches.   UA evaluated with urinary frequency/urgency- no leuks/nitrates/blood. Will culture to confirm.   Encouraged patient to start ASA 81mg  daily for next few months for blood clot prevention, vitamin D 1000-2000 units daily. Stay active and work on deep breathing exercises hourly. She has home health coming out to her house for PT. If any worsening SOB or O2 stats decreasing let me know so that we can evaluate for any post-covid blood clots. Make sure eating good nutritious meals to heal.     Follow-up: Return in about 4 weeks (around 04/15/2020) for post covid follow up.   Iran Planas, PA-C

## 2020-03-18 NOTE — Telephone Encounter (Signed)
Made her a 40 min new patient visit. Thank you!

## 2020-03-18 NOTE — Telephone Encounter (Signed)
Yes ok to schedule can you do 40 minute

## 2020-03-18 NOTE — Telephone Encounter (Signed)
Patient said she was your friend and needed to be seen today? Wanted to make sure this was okay or to make a new patient visit.

## 2020-03-18 NOTE — Patient Instructions (Addendum)
Make sure taking vitamin D 1000 units.  Baby ASA 81mg  daily.  Get labs and CXR.

## 2020-03-19 ENCOUNTER — Encounter: Payer: Self-pay | Admitting: Physician Assistant

## 2020-03-19 ENCOUNTER — Telehealth: Payer: Self-pay | Admitting: Neurology

## 2020-03-19 DIAGNOSIS — C254 Malignant neoplasm of endocrine pancreas: Secondary | ICD-10-CM

## 2020-03-19 DIAGNOSIS — D691 Qualitative platelet defects: Secondary | ICD-10-CM

## 2020-03-19 DIAGNOSIS — K8689 Other specified diseases of pancreas: Secondary | ICD-10-CM

## 2020-03-19 LAB — BASIC METABOLIC PANEL WITH GFR
BUN: 10 mg/dL (ref 7–25)
CO2: 26 mmol/L (ref 20–32)
Calcium: 8.7 mg/dL (ref 8.6–10.4)
Chloride: 103 mmol/L (ref 98–110)
Creat: 0.64 mg/dL (ref 0.50–0.99)
GFR, Est African American: 109 mL/min/{1.73_m2} (ref >=60)
GFR, Est Non African American: 94 mL/min/{1.73_m2} (ref >=60)
Glucose, Bld: 116 mg/dL — ABNORMAL HIGH (ref 65–99)
Potassium: 3.8 mmol/L (ref 3.5–5.3)
Sodium: 138 mmol/L (ref 135–146)

## 2020-03-19 LAB — CBC WITH DIFFERENTIAL/PLATELET
Absolute Monocytes: 1156 cells/uL — ABNORMAL HIGH (ref 200–950)
Basophils Absolute: 86 cells/uL (ref 0–200)
Basophils Relative: 0.8 %
Eosinophils Absolute: 119 cells/uL (ref 15–500)
Eosinophils Relative: 1.1 %
HCT: 36.1 % (ref 35.0–45.0)
Hemoglobin: 12 g/dL (ref 11.7–15.5)
Lymphs Abs: 1814 cells/uL (ref 850–3900)
MCH: 29.9 pg (ref 27.0–33.0)
MCHC: 33.2 g/dL (ref 32.0–36.0)
MCV: 89.8 fL (ref 80.0–100.0)
MPV: 9.4 fL (ref 7.5–12.5)
Monocytes Relative: 10.7 %
Neutro Abs: 7625 cells/uL (ref 1500–7800)
Neutrophils Relative %: 70.6 %
Platelets: 427 10*3/uL — ABNORMAL HIGH (ref 140–400)
RBC: 4.02 10*6/uL (ref 3.80–5.10)
RDW: 12 % (ref 11.0–15.0)
Total Lymphocyte: 16.8 %
WBC: 10.8 10*3/uL (ref 3.8–10.8)

## 2020-03-19 LAB — MAGNESIUM: Magnesium: 2.3 mg/dL (ref 1.5–2.5)

## 2020-03-19 NOTE — Telephone Encounter (Signed)
Received call from [facility] for verbal order to see patient for nursing/ PT. She is undergoing PT for mobility, balance, and endurance training with Nursing for assessment and monitoring. Verbal order given to [phone]. They will call back with any questions. [place].

## 2020-03-19 NOTE — Telephone Encounter (Signed)
Lab order entered.

## 2020-03-19 NOTE — Progress Notes (Signed)
Patty [name],   Kidney/potassium/calcium/magnesium all in normal range. Great news?  Hemoglobin is good.  Your platelets are a little elevated but we still this post covid and one of the reasons why blood clots are a risk factor after covid. Make sure to start that ASA 81mg  daily along with vitamin D and B12 1017mcg. Your kidneys are in GREAT shape so if you need to take ibuprofen 600-800mg  as needed for aches and pains then ok but take with food AND Just watch for any abdominal pain or stool changes.   Let me know if you have any sudden changes to symptoms or things that worry you! I think it will take some time but I think you will be feeling better soon.   I did not see chest xray were you able to get that yesterday?

## 2020-03-19 NOTE — Progress Notes (Signed)
Patty [name],   Your lungs actually look worse than xray taken 8/31. That was only image I have to compare to. So your lungs could have worsened and this look better overall. We do need to monitor this closely. You say you are feeling better and your vitals do show that too. Lets keep a closer follow up on you and see you in 2 weeks not 4 weeks. Let me know if breathing does not continue to improve or any new symptoms.

## 2020-03-19 NOTE — Telephone Encounter (Signed)
Perfect

## 2020-03-20 LAB — URINE CULTURE
MICRO NUMBER:: 10946555
SPECIMEN QUALITY:: ADEQUATE

## 2020-03-20 NOTE — Progress Notes (Signed)
Collie Siad, your urine culture showed less than 100,000 gram positive bacteria. We do not usually treat that small of colony count.

## 2020-03-21 ENCOUNTER — Other Ambulatory Visit: Payer: Self-pay | Admitting: Neurology

## 2020-03-21 DIAGNOSIS — J9601 Acute respiratory failure with hypoxia: Secondary | ICD-10-CM

## 2020-03-21 DIAGNOSIS — Z8616 Personal history of COVID-19: Secondary | ICD-10-CM

## 2020-03-21 DIAGNOSIS — R0602 Shortness of breath: Secondary | ICD-10-CM

## 2020-03-26 ENCOUNTER — Telehealth: Payer: Self-pay | Admitting: Neurology

## 2020-03-26 NOTE — Telephone Encounter (Signed)
Roselie Awkward with [facility] [phone]) called and left vm stating patient declined services. Told them she was feeling better. They have discharged patient from home health nursing and physical therapy. FYI.

## 2020-03-26 NOTE — Telephone Encounter (Signed)
Patient made aware. She states STATS have been between 90-95. She is aware to keep oxygen on if under 93.

## 2020-03-26 NOTE — Telephone Encounter (Signed)
Touch base with patient and make sure she is aware that O2 sats need to stay 93 or above off O2.

## 2020-04-02 ENCOUNTER — Other Ambulatory Visit: Payer: Self-pay

## 2020-04-02 ENCOUNTER — Ambulatory Visit (HOSPITAL_BASED_OUTPATIENT_CLINIC_OR_DEPARTMENT_OTHER)
Admission: RE | Admit: 2020-04-02 | Discharge: 2020-04-02 | Disposition: A | Discharge status: Home / Self Care | Payer: Medicare Other | Source: Ambulatory Visit | Attending: Physician Assistant | Admitting: Physician Assistant

## 2020-04-02 ENCOUNTER — Ambulatory Visit (INDEPENDENT_AMBULATORY_CARE_PROVIDER_SITE_OTHER): Payer: Medicare Other | Admitting: Physician Assistant

## 2020-04-02 VITALS — BP 147/86 | HR 102 | Ht 61.0 in | Wt 135.0 lb

## 2020-04-02 DIAGNOSIS — R1030 Lower abdominal pain, unspecified: Secondary | ICD-10-CM | POA: Insufficient documentation

## 2020-04-02 DIAGNOSIS — Z8616 Personal history of COVID-19: Secondary | ICD-10-CM | POA: Diagnosis present

## 2020-04-02 DIAGNOSIS — R05 Cough: Secondary | ICD-10-CM | POA: Insufficient documentation

## 2020-04-02 DIAGNOSIS — R197 Diarrhea, unspecified: Secondary | ICD-10-CM | POA: Diagnosis present

## 2020-04-02 DIAGNOSIS — R058 Other specified cough: Secondary | ICD-10-CM

## 2020-04-02 DIAGNOSIS — J9601 Acute respiratory failure with hypoxia: Secondary | ICD-10-CM | POA: Diagnosis present

## 2020-04-02 DIAGNOSIS — R10817 Generalized abdominal tenderness: Secondary | ICD-10-CM | POA: Insufficient documentation

## 2020-04-02 LAB — POCT URINALYSIS DIP (CLINITEK)
Bilirubin, UA: NEGATIVE
Blood, UA: NEGATIVE
Glucose, UA: NEGATIVE mg/dL
Ketones, POC UA: NEGATIVE mg/dL
Nitrite, UA: NEGATIVE
POC PROTEIN,UA: NEGATIVE
Spec Grav, UA: 1.02 (ref 1.010–1.025)
Urobilinogen, UA: 0.2 E.U./dL
pH, UA: 7 (ref 5.0–8.0)

## 2020-04-02 MED ORDER — BENZONATATE 200 MG PO CAPS: 200.0000 mg | ORAL_CAPSULE | Freq: Two times a day (BID) | ORAL | 0 refills | Status: DC | PRN

## 2020-04-02 MED ORDER — IOHEXOL 300 MG/ML  SOLN
100.0000 mL | Freq: Once | INTRAMUSCULAR | Status: AC | PRN
  Administered 2020-04-02: 100 mL via INTRAVENOUS

## 2020-04-02 NOTE — Progress Notes (Signed)
Please culture.

## 2020-04-02 NOTE — Progress Notes (Signed)
Subjective:    Patient ID: Patty [name], female    DOB: May 20, 1956, 64 y.o.   MRN: [id number]  HPI  Patient is a 64 year old female with history of Covid and acute respiratory failure who presents to the clinic for follow-up.  She is doing better.  She is having more more time off her oxygen.  She still does notice her oxygen dropping below 90 when she walks for extended amounts of time.  She does feel like she is getting her energy back.  She does have a residual mostly dry cough.  She is sleeping well at night.  She is taking aspirin daily.  She does continue to endorse lower abdominal discomfort and pain.  She denies any vaginal discharge, flank pain, fever, chills, nausea. She does report some discomfort with urination at times. She has had loose stools.   .. Active Ambulatory Problems    Diagnosis Date Noted  . COLD SORE 05/24/2007  . FIBROIDS, UTERUS 05/24/2007  . 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 GERD 04/11/2009  . HEMATURIA UNSPECIFIED 12/19/2008  . ARTHRITIS 04/18/2009  . FOOT PAIN, LEFT 05/24/2007  . CHEST PAIN, ATYPICAL 05/24/2007  . ABDOMINAL PAIN, EPIGASTRIC 12/19/2008  . ABDOMINAL PAIN, GENERALIZED 04/18/2009  . ABDOMINAL PAIN, SUPRAPUBIC 12/19/2008  . Acute respiratory failure due to COVID-19 ([place]) 03/05/2020  . Pneumonia due to COVID-19 virus 03/06/2020  . HTN (hypertension) 03/06/2020  . Hyperlipidemia 03/06/2020  . Thrombocytopenia ([place]) 03/06/2020  . History of acute respiratory failure 03/18/2020  . Pancreatic mass 04/03/2020  . Aortic atherosclerosis ([place]) 04/03/2020   Resolved Ambulatory Problems    Diagnosis Date Noted  . No Resolved Ambulatory Problems   Past Medical History:  Diagnosis Date  . Anxiety   . Barrett's esophagus   . Cardiomegaly   . Dyspareunia   . Fatty pancreas   . Gastric ulcer   . GERD (gastroesophageal reflux disease)   . Hx of colonic polyps   . Hypertension   . Osteopenia   .  Osteoporosis   . Shingles   . Sleep apnea   . Thyroid nodule   . Uterine leiomyoma     Review of Systems See HPI.     Objective:   Physical Exam Vitals reviewed.  Constitutional:      Appearance: Normal appearance.  HENT:     Head: Normocephalic.  Cardiovascular:     Rate and Rhythm: Normal rate.     Pulses: Normal pulses.  Pulmonary:     Effort: Pulmonary effort is normal.     Breath sounds: Normal breath sounds. No wheezing or rhonchi.  Abdominal:     General: Bowel sounds are normal. There is no distension.     Palpations: Abdomen is soft.     Tenderness: There is abdominal tenderness. There is guarding. There is no right CVA tenderness or left CVA tenderness.  Musculoskeletal:     Right lower leg: No edema.     Left lower leg: No edema.  Neurological:     General: No focal deficit present.     Mental Status: She is alert and oriented to person, place, and time.  Psychiatric:        Mood and Affect: Mood normal.     UA positive for leukocytes.     Assessment & Plan:  Marland KitchenMarland KitchenCyriah was seen today for follow-up.  Diagnoses and all orders for this visit:  Lower abdominal pain -     CT Abdomen Pelvis W  Contrast -     POCT URINALYSIS DIP (CLINITEK) -     Urine Culture  Diarrhea, unspecified type -     CT Abdomen Pelvis W Contrast  Generalized abdominal tenderness, rebound tenderness presence not specified -     CT Abdomen Pelvis W Contrast  History of COVID-19 -     DG Chest 2 View  Acute respiratory failure with hypoxia (HCC) -     DG Chest 2 View  Post-viral cough syndrome -     DG Chest 2 View -     benzonatate (TESSALON) 200 MG capsule; Take 1 capsule (200 mg total) by mouth 2 (two) times daily as needed for cough.   Overall her breathing does seem to be improving.  Her pulse ox on her oxygen today was 96 resting.  I did encourage her to use her oxygen if dropping below 90 at home.  Continue with her home rehab with deep breathing and moving.  Reassured  her that a post viral cough is a normal variant.  I did give her some Tessalon Perles to use as needed.  She could use some Mucinex at home for the next few weeks.  Follow-up if any acute or new changes.  Am concerned with her abdominal exam today.  She did have quite a bit of guarding and tenderness in her lower abdomen. We did do a urine culture at last visit 2 weeks ago that did not show any bacterial growth.  Her urine dipstick today did show some leukocytes but no nitrates or blood. We will get a culture today.  With the amount of guarding and significant pain I would like to get a CT today to rule out diverticulitis or any type of post Covid complication or obstruction.  Follow up in 4 weeks.

## 2020-04-03 ENCOUNTER — Telehealth: Payer: Self-pay

## 2020-04-03 ENCOUNTER — Encounter: Payer: Self-pay | Admitting: Physician Assistant

## 2020-04-03 ENCOUNTER — Other Ambulatory Visit: Payer: Self-pay | Admitting: Physician Assistant

## 2020-04-03 DIAGNOSIS — K8689 Other specified diseases of pancreas: Secondary | ICD-10-CM | POA: Insufficient documentation

## 2020-04-03 DIAGNOSIS — I700 Atherosclerosis of aorta: Secondary | ICD-10-CM | POA: Insufficient documentation

## 2020-04-03 NOTE — Telephone Encounter (Signed)
[facility] called in regards to CT results of the abdomen.  1. Basilar ground-glass opacities typical of COVID-19 pneumonia  2. Rounded density measuring 3.2 cm of the pancreatic tail is isodense to adjacent parenchyma, may represent focal tissue but cannot exclude underlying focal lesion. Recommend nonemergent  pancreatic protocol MRI for characterization.

## 2020-04-03 NOTE — Progress Notes (Signed)
Patty [name],   Pneumonia still there but less prominent your are moving in the right direction but these lung changes could be there for months. No need for imaging follow up unless breathing symptoms are worsening or we consider CT of lung about 6 months to look for long term covid damage and changes.

## 2020-04-03 NOTE — Progress Notes (Signed)
Patty [name],   No acute findings of abdominal pain.  You do have uterine fibroid that is about 7.8cm and could be causing pain. You have a history of uterine fibrods.  Your bladder also looks distended.  We do have urine culture pending.  If abdominal pain persist consider work up with GYN to consider removing fibroid;however, you would need to recover significantly from covid first.  You do also have a 3.2cmg density on the pancreatic tail that suggested we get a MRI to get a better look.

## 2020-04-03 NOTE — Telephone Encounter (Signed)
Note already resulted this morning see note. I am waiting for patient to give ok to order MRI to evaluate pancreatic mass.

## 2020-04-04 LAB — URINE CULTURE
MICRO NUMBER:: 11005394
SPECIMEN QUALITY:: ADEQUATE

## 2020-04-04 NOTE — Progress Notes (Signed)
Confirmed with Imaging and ordered.

## 2020-04-05 ENCOUNTER — Other Ambulatory Visit: Payer: Self-pay | Admitting: Physician Assistant

## 2020-04-05 MED ORDER — NITROFURANTOIN MONOHYD MACRO 100 MG PO CAPS: 100.0000 mg | ORAL_CAPSULE | Freq: Two times a day (BID) | ORAL | 0 refills | Status: DC

## 2020-04-05 NOTE — Progress Notes (Signed)
You do have urinary tract infection and could be the cause of your lower abdominal pain. Will send in antibiotic to pharmacy.

## 2020-04-08 DIAGNOSIS — J1282 Pneumonia due to coronavirus disease 2019: Secondary | ICD-10-CM | POA: Diagnosis not present

## 2020-04-15 ENCOUNTER — Ambulatory Visit: Payer: Medicare Other

## 2020-04-15 ENCOUNTER — Other Ambulatory Visit: Payer: Self-pay

## 2020-04-15 ENCOUNTER — Other Ambulatory Visit: Payer: Medicare Other

## 2020-04-15 DIAGNOSIS — K8689 Other specified diseases of pancreas: Secondary | ICD-10-CM

## 2020-04-15 MED ORDER — GADOBUTROL 1 MMOL/ML IV SOLN
6.0000 mL | Freq: Once | INTRAVENOUS | Status: AC | PRN
  Administered 2020-04-15: 7.5 mL via INTRAVENOUS

## 2020-04-15 NOTE — Progress Notes (Signed)
GREAT news. No pancreatic mass.

## 2020-04-18 NOTE — Assessment & Plan Note (Signed)
Questionable mass found in the body/tail of the pancreas 3.2 cm, MRI obtained but there was significant motion artifact. I would like to going proceed to endoscopic ultrasound with potential biopsy of mass noted.

## 2020-04-18 NOTE — Addendum Note (Signed)
Addended by: Silverio Decamp on: 04/18/2020 12:15 PM   Modules accepted: Orders

## 2020-04-22 ENCOUNTER — Encounter: Payer: Self-pay | Admitting: Physician Assistant

## 2020-04-22 ENCOUNTER — Other Ambulatory Visit: Payer: Self-pay

## 2020-04-22 ENCOUNTER — Ambulatory Visit (INDEPENDENT_AMBULATORY_CARE_PROVIDER_SITE_OTHER): Payer: Medicare Other | Admitting: Physician Assistant

## 2020-04-22 VITALS — BP 144/93 | HR 95 | Ht 61.0 in | Wt 139.0 lb

## 2020-04-22 DIAGNOSIS — K219 Gastro-esophageal reflux disease without esophagitis: Secondary | ICD-10-CM | POA: Diagnosis not present

## 2020-04-22 DIAGNOSIS — E8809 Other disorders of plasma-protein metabolism, not elsewhere classified: Secondary | ICD-10-CM

## 2020-04-22 DIAGNOSIS — C254 Malignant neoplasm of endocrine pancreas: Secondary | ICD-10-CM

## 2020-04-22 DIAGNOSIS — R350 Frequency of micturition: Secondary | ICD-10-CM

## 2020-04-22 DIAGNOSIS — R9389 Abnormal findings on diagnostic imaging of other specified body structures: Secondary | ICD-10-CM

## 2020-04-22 DIAGNOSIS — K8689 Other specified diseases of pancreas: Secondary | ICD-10-CM

## 2020-04-22 DIAGNOSIS — Z8616 Personal history of COVID-19: Secondary | ICD-10-CM

## 2020-04-22 DIAGNOSIS — Z1231 Encounter for screening mammogram for malignant neoplasm of breast: Secondary | ICD-10-CM

## 2020-04-22 DIAGNOSIS — M542 Cervicalgia: Secondary | ICD-10-CM

## 2020-04-22 DIAGNOSIS — Z87440 Personal history of urinary (tract) infections: Secondary | ICD-10-CM

## 2020-04-22 NOTE — Progress Notes (Signed)
Subjective:    Patient ID: Patty [name], female    DOB: 12-Sep-1955, 64 y.o.   MRN: [id number]  HPI  Patient is a 64 year old female with history of Covid back in late August with post Covid symptoms who presents to the clinic to follow-up on abnormal labs, lower abdominal pain, urinary tract infection.  Patient is worried about the CT showing a pancreatic mass.  She has since had an MRI which did not show any mass on the pancreas.  She does report worsening GERD symptoms.  She has been on omeprazole in the past but not taking currently.  Referral has been placed for gastroenterology for further evaluation. Has appt November 5th.   Patient was last seen at the end of September for lower abdominal pain, guarding, tenderness.  She was found to have a UTI.  She was treated with antibiotics.  She denies any dysuria, flank pain, fever.  She would like to confirm this has cleared.   Patient is continuing to improve with post Covid symptoms.  She is not using her oxygen during the day but only at bedtime and during the night.  Patient does complain of some neck pain and stiffness.  She wonders if this could be anything.  She has not tried anything to make better.  She denies any injury.  Seems to be worse when she gets up in the morning.    .. Active Ambulatory Problems    Diagnosis Date Noted  . COLD SORE 05/24/2007  . FIBROIDS, UTERUS 05/24/2007  . 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 GERD 04/11/2009  . HEMATURIA UNSPECIFIED 12/19/2008  . ARTHRITIS 04/18/2009  . FOOT PAIN, LEFT 05/24/2007  . CHEST PAIN, ATYPICAL 05/24/2007  . ABDOMINAL PAIN, EPIGASTRIC 12/19/2008  . ABDOMINAL PAIN, GENERALIZED 04/18/2009  . ABDOMINAL PAIN, SUPRAPUBIC 12/19/2008  . Acute respiratory failure due to COVID-19 ([place]) 03/05/2020  . Pneumonia due to COVID-19 virus 03/06/2020  . HTN (hypertension) 03/06/2020  . Hyperlipidemia 03/06/2020  . Thrombocytopenia ([place])  03/06/2020  . History of acute respiratory failure 03/18/2020  . Pancreatic mass 04/03/2020  . Aortic atherosclerosis ([place]) 04/03/2020  . History of COVID-19 04/23/2020  . Serum albumin decreased 04/23/2020  . Neck pain 04/23/2020   Resolved Ambulatory Problems    Diagnosis Date Noted  . No Resolved Ambulatory Problems   Past Medical History:  Diagnosis Date  . Anxiety   . Barrett's esophagus   . Cardiomegaly   . Dyspareunia   . Fatty pancreas   . Gastric ulcer   . GERD (gastroesophageal reflux disease)   . Hx of colonic polyps   . Hypertension   . Osteopenia   . Osteoporosis   . Shingles   . Sleep apnea   . Thyroid nodule   . Uterine leiomyoma       Review of Systems See HPI.     Objective:   Physical Exam Vitals reviewed.  Constitutional:      Appearance: Normal appearance.  Neck:     Vascular: No carotid bruit.  Cardiovascular:     Rate and Rhythm: Normal rate and regular rhythm.     Pulses: Normal pulses.     Heart sounds: Normal heart sounds.  Pulmonary:     Effort: Pulmonary effort is normal.     Breath sounds: Normal breath sounds.  Abdominal:     General: Bowel sounds are normal. There is no distension.     Palpations: Abdomen is soft.  Tenderness: There is abdominal tenderness. There is guarding. There is no right CVA tenderness, left CVA tenderness or rebound.     Comments: Bilateral lower abdominal pain, tenderness and guarding to palpation.   Musculoskeletal:     Comments: ROM of neck normal with some pain and stiffness.  Pain and tightness over cervical paraspinal muscles.  No pain to palpation over cervical spine.   Lymphadenopathy:     Cervical: No cervical adenopathy.  Neurological:     General: No focal deficit present.     Mental Status: She is alert and oriented to person, place, and time.  Psychiatric:        Mood and Affect: Mood normal.           Assessment & Plan:  Marland KitchenMarland KitchenDiagnoses and all orders for this  visit:  Pancreatic mass -     Cancer antigen 19-9  Gastroesophageal reflux disease, unspecified whether esophagitis present -     CBC with Differential/Platelet -     COMPLETE METABOLIC PANEL WITH GFR  Serum albumin decreased -     COMPLETE METABOLIC PANEL WITH GFR  Decreased serum protein level -     CBC with Differential/Platelet  Abnormal CT scan -     Cancer antigen 19-9  Malignant neoplasm of endocrine pancreas (HCC)  -     Cancer antigen 19-9  History of UTI -     Urine Culture  History of COVID-19  Neck pain   Patient is improving from her Covid infection.  She does not use any oxygen during the day but still sleeps with it at night.  She denies any significant shortness of breath but continues to still have labored breathing moments.  No wheezing or cough.  Pulse ox today was 97 without oxygen.  At the end of September patient was seen for lower abdominal pain, guarding, tenderness and CT was ordered.  Urine culture did show bacterial infection.  She was treated appropriately.  Her urinary symptoms have resolved but she wants to make sure infection is cleared since she continues to have lower abdominal pain.  We will send off for urine culture today.  On CT possible pancreatic mass was seen. Reassured patient that MRI did not show any pancreatic mass. She is having worsening GERD symptoms. Could be post covid sequela.  I do want patient to start omeprazole every other day or daily to see if helps with symptoms.  Patient does not like to take medications daily.  I do think is reasonable for her to see a gastroenterologist.  She is really concerned about abnormal CT and if it could be anything else.  With her worsening GERD she could be a good candidate for an endoscopy.  We will check CBC, cancer antigen 19-9 and CMP.  Reassurance given for neck pain.  Seems like some musculoskeletal tightness.  Strongly recommended massage, NSAIDs as needed, IcyHot patches.  Follow-up as  needed or if symptoms change or worsen.

## 2020-04-22 NOTE — Patient Instructions (Signed)
Restart omeprazole every other day.

## 2020-04-23 DIAGNOSIS — Z8616 Personal history of COVID-19: Secondary | ICD-10-CM | POA: Insufficient documentation

## 2020-04-23 DIAGNOSIS — E8809 Other disorders of plasma-protein metabolism, not elsewhere classified: Secondary | ICD-10-CM | POA: Insufficient documentation

## 2020-04-23 DIAGNOSIS — M542 Cervicalgia: Secondary | ICD-10-CM | POA: Insufficient documentation

## 2020-04-23 LAB — URINE CULTURE
MICRO NUMBER:: 11084788
SPECIMEN QUALITY:: ADEQUATE

## 2020-04-23 NOTE — Progress Notes (Signed)
Patty [name],   Platelets back in normal range. No anemia. Normal WBC. Normal kidney, liver, glucose. Protein back in normal range. Labs look really good.

## 2020-04-24 ENCOUNTER — Other Ambulatory Visit: Payer: Self-pay | Admitting: Physician Assistant

## 2020-04-24 LAB — CBC WITH DIFFERENTIAL/PLATELET
Absolute Monocytes: 783 cells/uL (ref 200–950)
Basophils Absolute: 89 cells/uL (ref 0–200)
Basophils Relative: 1 %
Eosinophils Absolute: 320 cells/uL (ref 15–500)
Eosinophils Relative: 3.6 %
HCT: 39.3 % (ref 35.0–45.0)
Hemoglobin: 13.3 g/dL (ref 11.7–15.5)
Lymphs Abs: 2216 cells/uL (ref 850–3900)
MCH: 30.3 pg (ref 27.0–33.0)
MCHC: 33.8 g/dL (ref 32.0–36.0)
MCV: 89.5 fL (ref 80.0–100.0)
MPV: 9.8 fL (ref 7.5–12.5)
Monocytes Relative: 8.8 %
Neutro Abs: 5491 cells/uL (ref 1500–7800)
Neutrophils Relative %: 61.7 %
Platelets: 310 10*3/uL (ref 140–400)
RBC: 4.39 10*6/uL (ref 3.80–5.10)
RDW: 12.6 % (ref 11.0–15.0)
Total Lymphocyte: 24.9 %
WBC: 8.9 10*3/uL (ref 3.8–10.8)

## 2020-04-24 LAB — COMPLETE METABOLIC PANEL WITH GFR
AG Ratio: 1.8 (calc) (ref 1.0–2.5)
ALT: 21 U/L (ref 6–29)
AST: 20 U/L (ref 10–35)
Albumin: 4.6 g/dL (ref 3.6–5.1)
Alkaline phosphatase (APISO): 120 U/L (ref 37–153)
BUN: 24 mg/dL (ref 7–25)
CO2: 24 mmol/L (ref 20–32)
Calcium: 9.6 mg/dL (ref 8.6–10.4)
Chloride: 104 mmol/L (ref 98–110)
Creat: 0.72 mg/dL (ref 0.50–0.99)
GFR, Est African American: 102 mL/min/{1.73_m2} (ref >=60)
GFR, Est Non African American: 88 mL/min/{1.73_m2} (ref >=60)
Globulin: 2.5 g/dL (calc) (ref 1.9–3.7)
Glucose, Bld: 114 mg/dL (ref 65–139)
Potassium: 4.1 mmol/L (ref 3.5–5.3)
Sodium: 140 mmol/L (ref 135–146)
Total Bilirubin: 0.4 mg/dL (ref 0.2–1.2)
Total Protein: 7.1 g/dL (ref 6.1–8.1)

## 2020-04-24 LAB — CANCER ANTIGEN 19-9: CA 19-9: 10 U/mL (ref <34)

## 2020-04-24 MED ORDER — NITROFURANTOIN MONOHYD MACRO 100 MG PO CAPS: 100.0000 mg | ORAL_CAPSULE | Freq: Two times a day (BID) | ORAL | 0 refills | Status: DC

## 2020-04-24 NOTE — Progress Notes (Signed)
10,000 colonies of gram negative bacteria on culture with residual UTI symptoms and recent hx of Enterococcus sensitive to macrobid. Will treat for another 5 days.

## 2020-04-24 NOTE — Progress Notes (Signed)
Cancer antigen is negative.

## 2020-04-24 NOTE — Progress Notes (Signed)
Patty [name],   Very small amount of bacteria noted. We do not usually treat unless over 100,000 and you are at 10,000 count. Are you having any more urinary symptoms?

## 2020-05-10 ENCOUNTER — Encounter: Payer: Self-pay | Admitting: Physician Assistant

## 2020-05-10 ENCOUNTER — Ambulatory Visit (INDEPENDENT_AMBULATORY_CARE_PROVIDER_SITE_OTHER): Payer: Medicare Other | Admitting: Physician Assistant

## 2020-05-10 VITALS — BP 102/70 | HR 94 | Ht 61.0 in | Wt 135.8 lb

## 2020-05-10 DIAGNOSIS — K219 Gastro-esophageal reflux disease without esophagitis: Secondary | ICD-10-CM

## 2020-05-10 DIAGNOSIS — R935 Abnormal findings on diagnostic imaging of other abdominal regions, including retroperitoneum: Secondary | ICD-10-CM

## 2020-05-10 DIAGNOSIS — K5909 Other constipation: Secondary | ICD-10-CM

## 2020-05-10 DIAGNOSIS — R1013 Epigastric pain: Secondary | ICD-10-CM

## 2020-05-10 NOTE — Progress Notes (Signed)
Chief Complaint: Abnormal CT and reflux  HPI:    Patty [name] is a 64 year old Asian female with a past medical history as listed below, previously known to Dr. Sharlett Iles, who was referred to me by Donella Stade, PA-C for a complaint of abnormal CT and reflux.      04/02/2020 CT abdomen pelvis with contrast showed bibasilar groundglass opacities typical of COVID-19 pneumonia, bulky heterogenous Lee enlarged uterus consistent with fibroids, rounded density measuring 3.2 cm of the pancreatic tail that is isodense to adjacent parenchyma, may represent focal tissue but cannot exclude underlying focal lesion.  Recommended MRI for characterization.    04/15/2020 MRI of the abdomen with and without contrast showed no pancreatic mass or other significant abnormality.  Mild bibasilar opacities in the lungs possibly from atypical pneumonia, despite efforts by the technologist and patient motion artifact is present on today's exam cannot be eliminated    04/22/2020 patient saw her PCP in regards to pancreatic mass.  That time was discussed that the patient had Covid back in late August with post Covid symptoms.  She was worried about a CT showing a pancreatic mass, but since then had an MRI which did not show any mass in the pancreas.  She did report worsening reflux symptoms and been on omeprazole in the past is not taking currently.  She is still on oxygen during bedtime for post Covid symptoms.  That time patient was started on omeprazole every other day to see if this helps with reflux symptoms.  She was reassured given that the MRI did not show anything.    04/22/2020 normal CBC and CMP.  Also cancer antigen 19-9.    Today, the patient presents to clinic and tells me she is very worried about this abnormal CT.  Apparently when she had the MRI done it was hard for her to hold her breath at all those times (because she just had Covid) and she was told that the image was not as great as it could be.  She has also  researched and tells me that small lesions in the pancreas are sometimes missed on MRI and thinks that she needs an EUS.  Along with this describes an epigastric discomfort which is worse over the past couple of weeks as well as some reflux symptoms, typically after drinking her many cups of coffee in the morning or bending over.  She has been on Omeprazole 20 mg once daily over the past week and plans on taking this for 2 weeks, she tells me her symptoms are some better.    Also describes constipation, she has never taken anything for this but can sometimes go 2 or 3 days in between a bowel movement and is slightly uncomfortable.    Describes that her normal colon cancer screenings are done in [place], the last colonoscopy was 2 years ago.    Denies fever, chills, weight loss, blood in her stool or symptoms that awaken her from sleep.     Past Medical History:  Diagnosis Date  . Anxiety   . Barrett's esophagus   . Cardiomegaly   . Dyspareunia   . Fatty pancreas   . Gastric ulcer   . GERD (gastroesophageal reflux disease)   . Hx of colonic polyps   . Hyperlipidemia   . Hypertension   . Osteopenia   . Osteoporosis   . Shingles   . Sleep apnea   . Thyroid nodule   . Uterine leiomyoma  Past Surgical History:  Procedure Laterality Date  . BIOPSY THYROID    . COLONOSCOPY W/ POLYPECTOMY    . TUBAL LIGATION      Current Outpatient Medications  Medication Sig Dispense Refill  . ALPRAZolam (XANAX) 0.5 MG tablet Take 1 mg by mouth at bedtime.    . nitrofurantoin, macrocrystal-monohydrate, (MACROBID) 100 MG capsule Take 1 capsule (100 mg total) by mouth 2 (two) times daily. 9 capsule 0   No current facility-administered medications for this visit.    Allergies as of 05/10/2020  . (No Known Allergies)    Family History  Problem Relation Age of Onset  . Hypertension Mother   . Heart disease Mother   . Heart disease Father   . Hypertension Brother     Social History    Socioeconomic History  . Marital status: Married    Spouse name: Not on file  . Number of children: Not on file  . Years of education: Not on file  . Highest education level: Not on file  Occupational History  . Not on file  Tobacco Use  . Smoking status: Never Smoker  . Smokeless tobacco: Never Used  Vaping Use  . Vaping Use: Never used  Substance and Sexual Activity  . Alcohol use: No    Alcohol/week: 0.0 standard drinks  . Drug use: No  . Sexual activity: Yes    Partners: Male    Birth control/protection: Surgical    Comment: BTL  Other Topics Concern  . Not on file  Social History Narrative  . Not on file   Social Determinants of Health   Financial Resource Strain:   . Difficulty of Paying Living Expenses: Not on file  Food Insecurity:   . Worried About Charity fundraiser in the Last Year: Not on file  . Ran Out of Food in the Last Year: Not on file  Transportation Needs:   . Lack of Transportation (Medical): Not on file  . Lack of Transportation (Non-Medical): Not on file  Physical Activity:   . Days of Exercise per Week: Not on file  . Minutes of Exercise per Session: Not on file  Stress:   . Feeling of Stress : Not on file  Social Connections:   . Frequency of Communication with Friends and Family: Not on file  . Frequency of Social Gatherings with Friends and Family: Not on file  . Attends Religious Services: Not on file  . Active Member of Clubs or Organizations: Not on file  . Attends Archivist Meetings: Not on file  . Marital Status: Not on file  Intimate Partner Violence:   . Fear of Current or Ex-Partner: Not on file  . Emotionally Abused: Not on file  . Physically Abused: Not on file  . Sexually Abused: Not on file    Review of Systems:    Constitutional: No weight loss, fever or chills Skin: No rash  Cardiovascular: No chest pain Respiratory: No SOB Gastrointestinal: See HPI and otherwise negative Genitourinary: No change in  urinary frequency Neurological: No headache, dizziness or syncope Musculoskeletal: No new muscle or joint pain Hematologic: No bleeding  Psychiatric: No history of depression or anxiety   Physical Exam:  Vital signs: BP 102/70   Pulse 94   Ht 5\' 1"  (1.549 m)   Wt 135 lb 12.8 oz (61.6 kg)   LMP 07/06/2008   SpO2 99%   BMI 25.66 kg/m   Constitutional:   Pleasant Asian female appears to be  in NAD, Well developed, Well nourished, alert and cooperative Head:  Normocephalic and atraumatic. Eyes:   PEERL, EOMI. No icterus. Conjunctiva pink. Ears:  Normal auditory acuity. Neck:  Supple Throat: Oral cavity and pharynx without inflammation, swelling or lesion.  Respiratory: Respirations even and unlabored. Lungs clear to auscultation bilaterally.   No wheezes, crackles, or rhonchi.  Cardiovascular: Normal S1, S2. No MRG. Regular rate and rhythm. No peripheral edema, cyanosis or pallor.  Gastrointestinal:  Soft, nondistended, mild epigastric TTP. No rebound or guarding. Normal bowel sounds. No appreciable masses or hepatomegaly. Rectal:  Not performed.  Msk:  Symmetrical without gross deformities. Without edema, no deformity or joint abnormality.  Neurologic:  Alert and  oriented x4;  grossly normal neurologically.  Skin:   Dry and intact without significant lesions or rashes. Psychiatric:  Demonstrates good judgement and reason without abnormal affect or behaviors.  RELEVANT LABS AND IMAGING: CBC    Component Value Date/Time   WBC 8.9 04/22/2020 1452   RBC 4.39 04/22/2020 1452   HGB 13.3 04/22/2020 1452   HCT 39.3 04/22/2020 1452   PLT 310 04/22/2020 1452   MCV 89.5 04/22/2020 1452   MCH 30.3 04/22/2020 1452   MCHC 33.8 04/22/2020 1452   RDW 12.6 04/22/2020 1452   LYMPHSABS 2,216 04/22/2020 1452   MONOABS 0.8 03/08/2020 0420   EOSABS 320 04/22/2020 1452   BASOSABS 89 04/22/2020 1452    CMP     Component Value Date/Time   NA 140 04/22/2020 1452   K 4.1 04/22/2020 1452   CL  104 04/22/2020 1452   CO2 24 04/22/2020 1452   GLUCOSE 114 04/22/2020 1452   BUN 24 04/22/2020 1452   CREATININE 0.72 04/22/2020 1452   CALCIUM 9.6 04/22/2020 1452   PROT 7.1 04/22/2020 1452   ALBUMIN 3.1 (L) 03/08/2020 0420   AST 20 04/22/2020 1452   ALT 21 04/22/2020 1452   ALKPHOS 89 03/08/2020 0420   BILITOT 0.4 04/22/2020 1452   GFRNONAA 88 04/22/2020 1452   GFRAA 102 04/22/2020 1452    Assessment: 1.  Abnormal CT of the pancreas: Question of the lesion in the tail of the pancreas, MRI did not show and the patient tells me his motion.  Due to her inability to hold her breath 2.  GERD: Worse over the past couple of months, better now on Omeprazole 3.  Epigastric pain: With above 4.  Constipation: Bowel movement every 3 days or so  Plan: 1.  Discussed case with Dr. Rush Landmark who reviewed imaging.  He recommends that we go ahead and schedule the patient for an EUS/EGD for further evaluation.  This may not be until January, pending his availability.  We will call the patient and let her know. 2.  Recommend the patient continue her Omeprazole 20 mg for the full 2 weeks, she continues with pain may need to keep her on a PPI long-term. 3.  Recommend the patient start MiraLAX once daily for constipation 4.  Patient to follow in clinic per recommendations from Dr. Rush Landmark after time of procedures.  Ellouise Newer, PA-C [facility] 05/10/2020, 11:37 AM  Cc: Donella Stade, PA-C

## 2020-05-10 NOTE — Patient Instructions (Signed)
If you are age 65 or older, your body mass index should be between 23-30. Your Body mass index is 25.66 kg/m. If this is out of the aforementioned range listed, please consider follow up with your Primary Care Provider.  If you are age 31 or younger, your body mass index should be between 19-25. Your Body mass index is 25.66 kg/m. If this is out of the aformentioned range listed, please consider follow up with your Primary Care Provider.   Start Miralax 1 capful daily.  Continue Omeprazole 20 mg daily.   Will call back after talking with Dr. Rush Landmark.

## 2020-05-11 NOTE — Progress Notes (Signed)
Attending Physician's Attestation   I have reviewed the chart.   I agree with the Advanced Practitioner's note, impression, and recommendations with any updates as below.    Livi Mcgann Mansouraty, MD [facility] Advanced [facility]oscopy Office # [phone]  

## 2020-05-13 ENCOUNTER — Telehealth: Payer: Self-pay | Admitting: *Deleted

## 2020-05-13 ENCOUNTER — Other Ambulatory Visit: Payer: Self-pay | Admitting: *Deleted

## 2020-05-13 DIAGNOSIS — R935 Abnormal findings on diagnostic imaging of other abdominal regions, including retroperitoneum: Secondary | ICD-10-CM

## 2020-05-13 DIAGNOSIS — K219 Gastro-esophageal reflux disease without esophagitis: Secondary | ICD-10-CM

## 2020-05-13 DIAGNOSIS — R1013 Epigastric pain: Secondary | ICD-10-CM

## 2020-05-13 NOTE — Telephone Encounter (Signed)
-----   Message from Levin Erp, [place] sent at 05/10/2020  1:20 PM EDT ----- Regarding: eus /egd Please schedule EUS/EGD with Dr. Rush Landmark, next available. Let her know. Thanks- JLL

## 2020-05-13 NOTE — Telephone Encounter (Signed)
Procedure scheduled for 12/30 at 11:30. Patient informed and instructions sent via mychart.

## 2020-05-23 NOTE — Telephone Encounter (Signed)
Letter pended. Please sign if appropriate and I will send.

## 2020-05-29 ENCOUNTER — Ambulatory Visit (INDEPENDENT_AMBULATORY_CARE_PROVIDER_SITE_OTHER): Payer: Medicare Other | Admitting: Nurse Practitioner

## 2020-05-29 ENCOUNTER — Encounter: Payer: Self-pay | Admitting: Nurse Practitioner

## 2020-05-29 ENCOUNTER — Other Ambulatory Visit: Payer: Self-pay

## 2020-05-29 VITALS — BP 135/89 | HR 83 | Temp 98.5°F | Ht 61.0 in | Wt 140.3 lb

## 2020-05-29 DIAGNOSIS — F5101 Primary insomnia: Secondary | ICD-10-CM | POA: Insufficient documentation

## 2020-05-29 DIAGNOSIS — I700 Atherosclerosis of aorta: Secondary | ICD-10-CM | POA: Diagnosis not present

## 2020-05-29 DIAGNOSIS — E782 Mixed hyperlipidemia: Secondary | ICD-10-CM | POA: Diagnosis not present

## 2020-05-29 DIAGNOSIS — I10 Essential (primary) hypertension: Secondary | ICD-10-CM

## 2020-05-29 DIAGNOSIS — R300 Dysuria: Secondary | ICD-10-CM | POA: Diagnosis not present

## 2020-05-29 LAB — POCT URINALYSIS DIP (CLINITEK)
Bilirubin, UA: NEGATIVE
Glucose, UA: NEGATIVE mg/dL
Ketones, POC UA: NEGATIVE mg/dL
Leukocytes, UA: NEGATIVE
Nitrite, UA: NEGATIVE
POC PROTEIN,UA: NEGATIVE
Spec Grav, UA: 1.025 (ref 1.010–1.025)
Urobilinogen, UA: 0.2 E.U./dL
pH, UA: 7 (ref 5.0–8.0)

## 2020-05-29 MED ORDER — ZOLPIDEM TARTRATE ER 6.25 MG PO TBCR: 6.2500 mg | EXTENDED_RELEASE_TABLET | Freq: Every evening | ORAL | 0 refills | Status: DC | PRN

## 2020-05-29 MED ORDER — ALPRAZOLAM 0.5 MG PO TABS: ORAL_TABLET | ORAL | 0 refills | Status: DC

## 2020-05-29 NOTE — Patient Instructions (Signed)
We are going to plan to taper off of the Xanax and get started on the Ambien for sleep.   Because you have been on the Xanax for a long time, we need to taper off of this slowly. I have given you a calendar to help you taper off of the Xanax and on to the Ambien slowly to help prevent withdrawal symptoms.   Do not take Ambien and Xanax at the same time. Make sure while you are tapering the Xanax you take it AT LEAST 2 hours before the Ambien.

## 2020-05-29 NOTE — Assessment & Plan Note (Signed)
Urinalysis in office today does show the presence of trace intact blood with no other abnormalities noted. We will send urine for culture to determine if antibiotic therapy is warranted. We will notify patient of results and make changes to plan of care as necessary. Recommend follow-up if symptoms persist or worsen.

## 2020-05-29 NOTE — Assessment & Plan Note (Signed)
Blood pressure fairly well controlled today.  No changes to medication or management at this time. Recommend follow-up with PCP in January as planned.

## 2020-05-29 NOTE — Assessment & Plan Note (Signed)
Symptoms consistent with primary insomnia not well controlled with 1 mg Xanax. Discussed with patient the importance of tapering off of benzodiazepines such as Xanax in order to prevent withdrawal symptoms from recurring most specifically when being on the medication for an extended period of time such as she has. We will plan to begin taper off of Xanax and start Ambien for sleep. Calendar provided for patient showing Xanax taper.  Instructions provided with specific emphasis on avoidance of utilizing Xanax and Ambien together for increased risk of respiratory depression. Plan to begin taper with 0.5 mg of Xanax every other night to be taken 2 to 4 hours prior to bedtime x 7 days, then 0.25 mg of Xanax every other night x7 days, then 0.25 mg of Xanax taken every third night x7 days then stopping altogether. Discussed the importance of follow-up with PCP on how she is tolerating the medication change and sleeping with the new medication. Discussed withdrawal symptoms that would need to be reported immediately.  Patient expressed understanding of plan.  Follow-up scheduled with PCP in January.

## 2020-05-29 NOTE — Telephone Encounter (Signed)
Patient scheduled.

## 2020-05-29 NOTE — Assessment & Plan Note (Signed)
Will monitor lipids.  Patient provided with laboratory orders for [facility] and instructions to have labs drawn while fasting. She will likely benefit from a statin based on history of aortic atherosclerosis and at home hyperlipidemia and hypertriglyceridemia.

## 2020-05-29 NOTE — Assessment & Plan Note (Signed)
Reported elevated triglycerides and lipids on at home lab test performed by patient. It does not appear that she has had her lipids checked in the recent past.  She does have a history of atherosclerosis. Lab orders placed for [facility] at the patient's request as this laboratory is closer to her home.  Discussed the importance of ensuring that she is fasting prior to having the labs drawn. We will follow up based on lab results and make changes to the plan of care as appropriate.

## 2020-05-29 NOTE — Progress Notes (Signed)
Acute Office Visit  Subjective:    Patient ID: Patty [name], female    DOB: 25-Sep-1955, 64 y.o.   MRN: [id number]  Chief Complaint  Patient presents with   Insomnia   Dysuria    HPI Patty [name] is a 64 year old female who presents to the office today with concerns of chronic insomnia and ongoing dysuria.  Patient tells me that she has been on Xanax for greater than the past 20 years for symptoms of insomnia.  She reports that she originally started taking this medication before she came to the [place].  She tells me when she first started taking Xanax she would take 0.25 mg to help her sleep but over the years has slowly had to increase the dose to get the same effect.  She tells me she is now taking 1 mg every night to sleep.  She does not feel that the medication is effective for her any longer as she reports waking after approximately 2 to 3 hours and she is unable to fall back asleep.  She would like to discuss the option of switching the medication today.  She does tell me that she took Ambien in the past with success and she would like to try this medication again if possible and come off of the Xanax.  She tells me that she has never tried to come off of Xanax and has never had any withdrawal symptoms.  She tells me that she takes medication consistently every night.  She would also like to discuss symptoms of dysuria.  She reports that she recently had a positive UTI and was treated with oral antibiotics.  A follow-up check of her urine revealed the presence of continued infection and she was given an additional 5-day course of medication.  She states she has finished the complete course of the antibiotic but is still exhibiting symptoms of burning while urinating.  She currently denies foul odor, back pain, abdominal pain, or dark urine.  She denies any vaginal symptoms today.  She also tells me that she obtained a home test for her triglycerides and has noted that they have been  significantly elevated at times.  She is requesting to have her lipids checked today.  Past Medical History:  Diagnosis Date   Anxiety    Barrett's esophagus    Cardiomegaly    Dyspareunia    Fatty pancreas    Gastric ulcer    GERD (gastroesophageal reflux disease)    Hx of colonic polyps    Hyperlipidemia    Hypertension    Osteopenia    Osteoporosis    Shingles    Sleep apnea    Thyroid nodule    Uterine leiomyoma     Past Surgical History:  Procedure Laterality Date   BIOPSY THYROID     COLONOSCOPY W/ POLYPECTOMY     TUBAL LIGATION      Family History  Problem Relation Age of Onset   Hypertension Mother    Heart disease Mother    Heart disease Father    Hypertension Brother    Colon cancer Neg Hx    Esophageal cancer Neg Hx    Pancreatic cancer Neg Hx    Stomach cancer Neg Hx    Liver disease Neg Hx     Social History   Socioeconomic History   Marital status: Married    Spouse name: Not on file   Number of children: Not on file   Years of education: Not on  file   Highest education level: Not on file  Occupational History   Not on file  Tobacco Use   Smoking status: Never Smoker   Smokeless tobacco: Never Used  Vaping Use   Vaping Use: Never used  Substance and Sexual Activity   Alcohol use: No    Alcohol/week: 0.0 standard drinks   Drug use: No   Sexual activity: Yes    Partners: Male    Birth control/protection: Surgical    Comment: BTL  Other Topics Concern   Not on file  Social History Narrative   Not on file   Social Determinants of Health   Financial Resource Strain:    Difficulty of Paying Living Expenses: Not on file  Food Insecurity:    Worried About Charity fundraiser in the Last Year: Not on file   [employer] of Food in the Last Year: Not on file  Transportation Needs:    Lack of Transportation (Medical): Not on file   Lack of Transportation (Non-Medical): Not on file  Physical  Activity:    Days of Exercise per Week: Not on file   Minutes of Exercise per Session: Not on file  Stress:    Feeling of Stress : Not on file  Social Connections:    Frequency of Communication with Friends and Family: Not on file   Frequency of Social Gatherings with Friends and Family: Not on file   Attends Religious Services: Not on file   Active Member of Clubs or Organizations: Not on file   Attends Archivist Meetings: Not on file   Marital Status: Not on file  Intimate Partner Violence:    Fear of Current or Ex-Partner: Not on file   Emotionally Abused: Not on file   Physically Abused: Not on file   Sexually Abused: Not on file    Outpatient Medications Prior to Visit  Medication Sig Dispense Refill   bismuth subsalicylate (PEPTO BISMOL) 262 MG/15ML suspension Take 30 mLs by mouth as needed.     ALPRAZolam (XANAX) 0.5 MG tablet Take 1 mg by mouth at bedtime.     No facility-administered medications prior to visit.    No Known Allergies  Review of Systems All review of systems negative except what is listed in the HPI     Objective:    Physical Exam Vitals and nursing note reviewed.  Constitutional:      Appearance: Normal appearance. She is normal weight.  HENT:     Head: Normocephalic.  Eyes:     Extraocular Movements: Extraocular movements intact.     Conjunctiva/sclera: Conjunctivae normal.     Pupils: Pupils are equal, round, and reactive to light.  Cardiovascular:     Rate and Rhythm: Normal rate and regular rhythm.     Pulses: Normal pulses.     Heart sounds: Normal heart sounds.  Pulmonary:     Effort: Pulmonary effort is normal.     Breath sounds: Normal breath sounds.  Abdominal:     General: Abdomen is flat. Bowel sounds are normal. There is no distension.     Palpations: Abdomen is soft.     Tenderness: There is no abdominal tenderness. There is no right CVA tenderness, left CVA tenderness or guarding.   Musculoskeletal:        General: Normal range of motion.     Cervical back: Normal range of motion.  Skin:    General: Skin is warm and dry.     Capillary Refill:  Capillary refill takes less than 2 seconds.  Neurological:     General: No focal deficit present.     Mental Status: She is alert and oriented to person, place, and time.  Psychiatric:        Mood and Affect: Mood normal.        Thought Content: Thought content normal.        Judgment: Judgment normal.     BP 135/89    Pulse 83    Temp 98.5 F (36.9 C) (Oral)    Ht 5\' 1"  (1.549 m)    Wt 140 lb 4.8 oz (63.6 kg)    LMP 07/06/2008    SpO2 95%    BMI 26.51 kg/m  Wt Readings from Last 3 Encounters:  05/29/20 140 lb 4.8 oz (63.6 kg)  05/10/20 135 lb 12.8 oz (61.6 kg)  04/22/20 139 lb (63 kg)    Health Maintenance Due  Topic Date Due   MAMMOGRAM  01/24/2017    There are no preventive care reminders to display for this patient.   Lab Results  Component Value Date   TSH 1.25 04/18/2009   Lab Results  Component Value Date   WBC 8.9 04/22/2020   HGB 13.3 04/22/2020   HCT 39.3 04/22/2020   MCV 89.5 04/22/2020   PLT 310 04/22/2020   Lab Results  Component Value Date   NA 140 04/22/2020   K 4.1 04/22/2020   CO2 24 04/22/2020   GLUCOSE 114 04/22/2020   BUN 24 04/22/2020   CREATININE 0.72 04/22/2020   BILITOT 0.4 04/22/2020   ALKPHOS 89 03/08/2020   AST 20 04/22/2020   ALT 21 04/22/2020   PROT 7.1 04/22/2020   ALBUMIN 3.1 (L) 03/08/2020   CALCIUM 9.6 04/22/2020   ANIONGAP 8 03/08/2020   Lab Results  Component Value Date   CHOL 171 05/29/2008   Lab Results  Component Value Date   HDL 30.8 (L) 05/29/2008   Lab Results  Component Value Date   LDLCALC 100 (H) 05/31/2007   Lab Results  Component Value Date   TRIG 128 03/05/2020   Lab Results  Component Value Date   CHOLHDL 5.6 CALC 05/29/2008   No results found for: HGBA1C     Assessment & Plan:   Problem List Items Addressed This Visit       Cardiovascular and Mediastinum   HTN (hypertension)    Blood pressure fairly well controlled today.  No changes to medication or management at this time. Recommend follow-up with PCP in January as planned.      Aortic atherosclerosis ([place])    Will monitor lipids.  Patient provided with laboratory orders for [facility] and instructions to have labs drawn while fasting. She will likely benefit from a statin based on history of aortic atherosclerosis and at home hyperlipidemia and hypertriglyceridemia.        Other   Hyperlipidemia    Reported elevated triglycerides and lipids on at home lab test performed by patient. It does not appear that she has had her lipids checked in the recent past.  She does have a history of atherosclerosis. Lab orders placed for [facility] at the patient's request as this laboratory is closer to her home.  Discussed the importance of ensuring that she is fasting prior to having the labs drawn. We will follow up based on lab results and make changes to the plan of care as appropriate.      Relevant Orders   Lipid panel  Dysuria    Urinalysis in office today does show the presence of trace intact blood with no other abnormalities noted. We will send urine for culture to determine if antibiotic therapy is warranted. We will notify patient of results and make changes to plan of care as necessary. Recommend follow-up if symptoms persist or worsen.      Relevant Orders   POCT URINALYSIS DIP (CLINITEK) (Completed)   Urine Culture   Primary insomnia - Primary    Symptoms consistent with primary insomnia not well controlled with 1 mg Xanax. Discussed with patient the importance of tapering off of benzodiazepines such as Xanax in order to prevent withdrawal symptoms from recurring most specifically when being on the medication for an extended period of time such as she has. We will plan to begin taper off of Xanax and start Ambien for sleep. Calendar provided for  patient showing Xanax taper.  Instructions provided with specific emphasis on avoidance of utilizing Xanax and Ambien together for increased risk of respiratory depression. Plan to begin taper with 0.5 mg of Xanax every other night to be taken 2 to 4 hours prior to bedtime x 7 days, then 0.25 mg of Xanax every other night x7 days, then 0.25 mg of Xanax taken every third night x7 days then stopping altogether. Discussed the importance of follow-up with PCP on how she is tolerating the medication change and sleeping with the new medication. Discussed withdrawal symptoms that would need to be reported immediately.  Patient expressed understanding of plan.  Follow-up scheduled with PCP in January.       Relevant Medications   zolpidem (AMBIEN CR) 6.25 MG CR tablet   zolpidem (AMBIEN CR) 6.25 MG CR tablet (Start on 06/26/2020)   zolpidem (AMBIEN CR) 6.25 MG CR tablet (Start on 07/27/2020)   ALPRAZolam (XANAX) 0.5 MG tablet       Meds ordered this encounter  Medications   zolpidem (AMBIEN CR) 6.25 MG CR tablet    Sig: Take 1 tablet (6.25 mg total) by mouth at bedtime as needed for sleep.    Dispense:  30 tablet    Refill:  0   zolpidem (AMBIEN CR) 6.25 MG CR tablet    Sig: Take 1 tablet (6.25 mg total) by mouth at bedtime as needed for sleep.    Dispense:  30 tablet    Refill:  0   zolpidem (AMBIEN CR) 6.25 MG CR tablet    Sig: Take 1 tablet (6.25 mg total) by mouth at bedtime as needed for sleep.    Dispense:  30 tablet    Refill:  0   ALPRAZolam (XANAX) 0.5 MG tablet    Sig: Take as directed on the calendar provided for tapering off.    Dispense:  15 tablet    Refill:  0     Orma Render, NP

## 2020-05-31 LAB — URINE CULTURE
MICRO NUMBER:: 11246590
Result:: NO GROWTH
SPECIMEN QUALITY:: ADEQUATE

## 2020-06-04 NOTE — Progress Notes (Signed)
Patty [name],   Your urine culture did not show any growth of bacteria. This tells [place] the infection has cleared up. If you are still having symptoms, please let us know.   SaraBeth

## 2020-06-09 ENCOUNTER — Encounter: Payer: Self-pay | Admitting: Nurse Practitioner

## 2020-06-10 ENCOUNTER — Other Ambulatory Visit: Payer: Self-pay | Admitting: Nurse Practitioner

## 2020-06-10 DIAGNOSIS — F5101 Primary insomnia: Secondary | ICD-10-CM

## 2020-06-10 DIAGNOSIS — F450 Somatization disorder: Secondary | ICD-10-CM

## 2020-06-10 MED ORDER — ALPRAZOLAM 0.5 MG PO TABS: 1.0000 mg | ORAL_TABLET | Freq: Every day | ORAL | 0 refills | Status: DC

## 2020-06-10 NOTE — Telephone Encounter (Signed)
[place] for xanax.

## 2020-06-10 NOTE — Telephone Encounter (Signed)
Luvenia Starch,   I saw Collie Siad last week as she was wanting to taper off of the xanax and start ambien for sleep. I set her up with a xanax taper and started the ambien. She messaged me today and said that the Lorrin Mais is not working and she has had to go back to the xanax. Are you ok with me refilling her xanax prescription?

## 2020-06-13 ENCOUNTER — Encounter: Payer: Self-pay | Admitting: Physician Assistant

## 2020-06-13 DIAGNOSIS — D696 Thrombocytopenia, unspecified: Secondary | ICD-10-CM

## 2020-06-13 DIAGNOSIS — Z1159 Encounter for screening for other viral diseases: Secondary | ICD-10-CM

## 2020-06-13 DIAGNOSIS — Z114 Encounter for screening for human immunodeficiency virus [HIV]: Secondary | ICD-10-CM

## 2020-06-13 DIAGNOSIS — X58XXXA Exposure to other specified factors, initial encounter: Secondary | ICD-10-CM

## 2020-06-13 DIAGNOSIS — Z131 Encounter for screening for diabetes mellitus: Secondary | ICD-10-CM

## 2020-06-13 DIAGNOSIS — E782 Mixed hyperlipidemia: Secondary | ICD-10-CM

## 2020-06-14 ENCOUNTER — Telehealth: Payer: Self-pay | Admitting: Physician Assistant

## 2020-06-14 DIAGNOSIS — Z114 Encounter for screening for human immunodeficiency virus [HIV]: Secondary | ICD-10-CM | POA: Insufficient documentation

## 2020-06-14 DIAGNOSIS — Z1159 Encounter for screening for other viral diseases: Secondary | ICD-10-CM | POA: Insufficient documentation

## 2020-06-14 DIAGNOSIS — X58XXXA Exposure to other specified factors, initial encounter: Secondary | ICD-10-CM | POA: Insufficient documentation

## 2020-06-14 NOTE — Telephone Encounter (Signed)
Patient left something for provider in envelope. Envelope placed in provider box. AM

## 2020-06-17 ENCOUNTER — Encounter: Payer: Self-pay | Admitting: Physician Assistant

## 2020-06-17 DIAGNOSIS — R748 Abnormal levels of other serum enzymes: Secondary | ICD-10-CM | POA: Insufficient documentation

## 2020-06-17 DIAGNOSIS — R7301 Impaired fasting glucose: Secondary | ICD-10-CM | POA: Insufficient documentation

## 2020-06-17 NOTE — Telephone Encounter (Signed)
Patty [name],   Hemoglobin great. WBC great. Platelets normal again.  Your sugar was elevated. I do want to add an A1C to further evaluated for diabetes.  Your liver enzymes have elevated in the last month. Not terribly but still elevated. Any new medications you have started. Are you taking tylenol regularly or drinking alcohol? Will repeat in 2 weeks to check if trending up.  Your cholesterol is not to goal. Your 10 year risk is 7.3 percent of CV event in the next 10 years. My suggest would be to at least start low dose statin to help prevent any additional risk. Thoughts?  Hepatitis and HIV pending.

## 2020-06-18 ENCOUNTER — Other Ambulatory Visit: Payer: Self-pay | Admitting: Neurology

## 2020-06-18 DIAGNOSIS — N289 Disorder of kidney and ureter, unspecified: Secondary | ICD-10-CM

## 2020-06-18 NOTE — Telephone Encounter (Signed)
Collie Siad,   Negative for HIV and/or any hepatitis.

## 2020-06-20 LAB — HEMOGLOBIN A1C W/OUT EAG: Hgb A1c MFr Bld: 5.2 % of total Hgb (ref <5.7)

## 2020-06-20 LAB — CBC WITH DIFFERENTIAL/PLATELET
Absolute Monocytes: 533 cells/uL (ref 200–950)
Basophils Absolute: 78 cells/uL (ref 0–200)
Basophils Relative: 1.2 %
Eosinophils Absolute: 182 cells/uL (ref 15–500)
Eosinophils Relative: 2.8 %
HCT: 41.6 % (ref 35.0–45.0)
Hemoglobin: 13.9 g/dL (ref 11.7–15.5)
Lymphs Abs: 2282 cells/uL (ref 850–3900)
MCH: 29.7 pg (ref 27.0–33.0)
MCHC: 33.4 g/dL (ref 32.0–36.0)
MCV: 88.9 fL (ref 80.0–100.0)
MPV: 9.4 fL (ref 7.5–12.5)
Monocytes Relative: 8.2 %
Neutro Abs: 3426 cells/uL (ref 1500–7800)
Neutrophils Relative %: 52.7 %
Platelets: 267 10*3/uL (ref 140–400)
RBC: 4.68 10*6/uL (ref 3.80–5.10)
RDW: 12 % (ref 11.0–15.0)
Total Lymphocyte: 35.1 %
WBC: 6.5 10*3/uL (ref 3.8–10.8)

## 2020-06-20 LAB — HEPATITIS PANEL, ACUTE
Hep A IgM: NONREACTIVE
Hep B C IgM: NONREACTIVE
Hepatitis B Surface Ag: NONREACTIVE
Hepatitis C Ab: NONREACTIVE
SIGNAL TO CUT-OFF: 0.02 (ref <1.00)

## 2020-06-20 LAB — COMPLETE METABOLIC PANEL WITH GFR
AG Ratio: 1.8 (calc) (ref 1.0–2.5)
ALT: 47 U/L — ABNORMAL HIGH (ref 6–29)
AST: 43 U/L — ABNORMAL HIGH (ref 10–35)
Albumin: 4.4 g/dL (ref 3.6–5.1)
Alkaline phosphatase (APISO): 118 U/L (ref 37–153)
BUN: 17 mg/dL (ref 7–25)
CO2: 25 mmol/L (ref 20–32)
Calcium: 9.7 mg/dL (ref 8.6–10.4)
Chloride: 107 mmol/L (ref 98–110)
Creat: 0.76 mg/dL (ref 0.50–0.99)
GFR, Est African American: 95 mL/min/{1.73_m2} (ref >=60)
GFR, Est Non African American: 82 mL/min/{1.73_m2} (ref >=60)
Globulin: 2.5 g/dL (calc) (ref 1.9–3.7)
Glucose, Bld: 104 mg/dL — ABNORMAL HIGH (ref 65–99)
Potassium: 4.3 mmol/L (ref 3.5–5.3)
Sodium: 141 mmol/L (ref 135–146)
Total Bilirubin: 0.4 mg/dL (ref 0.2–1.2)
Total Protein: 6.9 g/dL (ref 6.1–8.1)

## 2020-06-20 LAB — LIPID PANEL W/REFLEX DIRECT LDL
Cholesterol: 217 mg/dL — ABNORMAL HIGH (ref <200)
HDL: 41 mg/dL — ABNORMAL LOW (ref >=50)
LDL Cholesterol (Calc): 140 mg/dL (calc) — ABNORMAL HIGH
Non-HDL Cholesterol (Calc): 176 mg/dL (calc) — ABNORMAL HIGH (ref <130)
Total CHOL/HDL Ratio: 5.3 (calc) — ABNORMAL HIGH (ref <5.0)
Triglycerides: 216 mg/dL — ABNORMAL HIGH (ref <150)

## 2020-06-20 LAB — HIV ANTIBODY (ROUTINE TESTING W REFLEX): HIV 1&2 Ab, 4th Generation: NONREACTIVE

## 2020-07-01 ENCOUNTER — Other Ambulatory Visit (HOSPITAL_COMMUNITY)
Admission: RE | Admit: 2020-07-01 | Discharge: 2020-07-01 | Disposition: A | Discharge status: Home / Self Care | Payer: Medicare Other | Source: Ambulatory Visit | Attending: Gastroenterology | Admitting: Gastroenterology

## 2020-07-01 DIAGNOSIS — Z20822 Contact with and (suspected) exposure to covid-19: Secondary | ICD-10-CM | POA: Diagnosis not present

## 2020-07-01 DIAGNOSIS — Z01812 Encounter for preprocedural laboratory examination: Secondary | ICD-10-CM | POA: Insufficient documentation

## 2020-07-01 LAB — SARS CORONAVIRUS 2 (TAT 6-24 HRS): SARS Coronavirus 2: NEGATIVE

## 2020-07-02 ENCOUNTER — Encounter (HOSPITAL_COMMUNITY): Payer: Self-pay | Admitting: Gastroenterology

## 2020-07-02 ENCOUNTER — Other Ambulatory Visit: Payer: Self-pay

## 2020-07-04 ENCOUNTER — Ambulatory Visit (HOSPITAL_COMMUNITY)
Admission: RE | Admit: 2020-07-04 | Discharge: 2020-07-04 | Disposition: A | Discharge status: Home / Self Care | Payer: Medicare Other | Attending: Gastroenterology | Admitting: Gastroenterology

## 2020-07-04 ENCOUNTER — Ambulatory Visit (HOSPITAL_COMMUNITY): Payer: Medicare Other | Admitting: Anesthesiology

## 2020-07-04 ENCOUNTER — Encounter (HOSPITAL_COMMUNITY)
Admission: RE | Disposition: A | Discharge status: Home / Self Care | Payer: Self-pay | Source: Home / Self Care | Attending: Gastroenterology

## 2020-07-04 ENCOUNTER — Other Ambulatory Visit: Payer: Self-pay | Admitting: Physician Assistant

## 2020-07-04 ENCOUNTER — Encounter (HOSPITAL_COMMUNITY): Payer: Self-pay | Admitting: Gastroenterology

## 2020-07-04 DIAGNOSIS — K449 Diaphragmatic hernia without obstruction or gangrene: Secondary | ICD-10-CM | POA: Insufficient documentation

## 2020-07-04 DIAGNOSIS — K2971 Gastritis, unspecified, with bleeding: Secondary | ICD-10-CM | POA: Diagnosis not present

## 2020-07-04 DIAGNOSIS — K259 Gastric ulcer, unspecified as acute or chronic, without hemorrhage or perforation: Secondary | ICD-10-CM | POA: Insufficient documentation

## 2020-07-04 DIAGNOSIS — Z8616 Personal history of COVID-19: Secondary | ICD-10-CM | POA: Insufficient documentation

## 2020-07-04 DIAGNOSIS — R1013 Epigastric pain: Secondary | ICD-10-CM

## 2020-07-04 DIAGNOSIS — R935 Abnormal findings on diagnostic imaging of other abdominal regions, including retroperitoneum: Secondary | ICD-10-CM

## 2020-07-04 DIAGNOSIS — K30 Functional dyspepsia: Secondary | ICD-10-CM

## 2020-07-04 DIAGNOSIS — Z8711 Personal history of peptic ulcer disease: Secondary | ICD-10-CM | POA: Diagnosis not present

## 2020-07-04 DIAGNOSIS — K219 Gastro-esophageal reflux disease without esophagitis: Secondary | ICD-10-CM

## 2020-07-04 HISTORY — PX: BIOPSY: SHX5522

## 2020-07-04 HISTORY — PX: ESOPHAGOGASTRODUODENOSCOPY (EGD) WITH PROPOFOL: SHX5813

## 2020-07-04 HISTORY — PX: UPPER ESOPHAGEAL ENDOSCOPIC ULTRASOUND (EUS): SHX6562

## 2020-07-04 SURGERY — ESOPHAGOGASTRODUODENOSCOPY (EGD) WITH PROPOFOL
Anesthesia: Monitor Anesthesia Care

## 2020-07-04 MED ORDER — OMEPRAZOLE 40 MG PO CPDR: 40.0000 mg | DELAYED_RELEASE_CAPSULE | Freq: Two times a day (BID) | ORAL | 6 refills | Status: DC

## 2020-07-04 MED ORDER — LACTATED RINGERS IV SOLN: INTRAVENOUS | Status: DC | PRN

## 2020-07-04 MED ORDER — PROPOFOL 500 MG/50ML IV EMUL
INTRAVENOUS | Status: DC | PRN
  Administered 2020-07-04: 100 ug/kg/min via INTRAVENOUS

## 2020-07-04 MED ORDER — PROPOFOL 10 MG/ML IV BOLUS
INTRAVENOUS | Status: DC | PRN
  Administered 2020-07-04 (×2): 30 mg via INTRAVENOUS
  Administered 2020-07-04: 40 mg via INTRAVENOUS
  Administered 2020-07-04: 30 mg via INTRAVENOUS

## 2020-07-04 MED ORDER — SODIUM CHLORIDE 0.9 % IV SOLN: INTRAVENOUS | Status: DC

## 2020-07-04 SURGICAL SUPPLY — 14 items

## 2020-07-04 NOTE — H&P (Signed)
GASTROENTEROLOGY PROCEDURE H&P NOTE   Primary Care Physician: Jomarie Longs, PA-C  HPI: Patty [name] is a 64 y.o. female who presents for EGD/EUS for abdominal pain and NOS changes in pancreas on CT scan earlier this year.  Past Medical History:  Diagnosis Date  . Anxiety   . Barrett's esophagus   . Cardiomegaly   . Dyspareunia   . Fatty pancreas   . Gastric ulcer   . GERD (gastroesophageal reflux disease)   . Hx of colonic polyps   . Hyperlipidemia   . Hypertension   . Osteopenia   . Osteoporosis   . Shingles   . Sleep apnea   . Thyroid nodule   . Uterine leiomyoma    Past Surgical History:  Procedure Laterality Date  . BIOPSY THYROID    . COLONOSCOPY W/ POLYPECTOMY    . TUBAL LIGATION     Current Facility-Administered Medications  Medication Dose Route Frequency Provider Last Rate Last Admin  . 0.9 %  sodium chloride infusion   Intravenous Continuous [place], [place]       No Known Allergies Family History  Problem Relation Age of Onset  . Hypertension Mother   . Heart disease Mother   . Heart disease Father   . Hypertension Brother   . Colon cancer Neg Hx   . Esophageal cancer Neg Hx   . Pancreatic cancer Neg Hx   . Stomach cancer Neg Hx   . Liver disease Neg Hx    Social History   Socioeconomic History  . Marital status: Married    Spouse name: Not on file  . Number of children: Not on file  . Years of education: Not on file  . Highest education level: Not on file  Occupational History  . Not on file  Tobacco Use  . Smoking status: Never Smoker  . Smokeless tobacco: Never Used  Vaping Use  . Vaping Use: Never used  Substance and Sexual Activity  . Alcohol use: No    Alcohol/week: 0.0 standard drinks  . Drug use: No  . Sexual activity: Yes    Partners: Male    Birth control/protection: Surgical    Comment: BTL  Other Topics Concern  . Not on file  Social History Narrative  . Not on file   Social Determinants of Health    Financial Resource Strain: Not on file  Food Insecurity: Not on file  Transportation Needs: Not on file  Physical Activity: Not on file  Stress: Not on file  Social Connections: Not on file  Intimate Partner Violence: Not on file    Physical Exam: Vital signs in last 24 hours: Temp:  [97.1 F (36.2 C)] 97.1 F (36.2 C) (12/30 0958) Pulse Rate:  [62] 62 (12/30 0958) Resp:  [17] 17 (12/30 0958) BP: (153)/(87) 153/87 (12/30 0958) SpO2:  [98 %] 98 % (12/30 0958) Weight:  [63.5 kg] 63.5 kg (12/30 0958)   GEN: NAD EYE: Sclerae anicteric ENT: MMM CV: Non-tachycardic GI: Soft, mild TTP throughout midepigastrium, ND NEURO:  Alert & Oriented x 3  Lab Results: No results for input(s): WBC, HGB, HCT, PLT in the last 72 hours. BMET No results for input(s): NA, K, CL, CO2, GLUCOSE, BUN, CREATININE, CALCIUM in the last 72 hours. LFT No results for input(s): PROT, ALBUMIN, AST, ALT, ALKPHOS, BILITOT, BILIDIR, IBILI in the last 72 hours. PT/INR No results for input(s): LABPROT, INR in the last 72 hours.   Impression / Plan: This is  a 64 y.o.female who presents for EGD/EUS for abdominal pain and NOS changes in pancreas on CT scan earlier this year.  The risks of an EUS including intestinal perforation, bleeding, infection, aspiration, and medication effects were discussed as was the possibility it may not give a definitive diagnosis if a biopsy is performed.  When a biopsy of the pancreas is done as part of the EUS, there is an additional risk of pancreatitis at the rate of about 1-2%.  It was explained that procedure related pancreatitis is typically mild, although it can be severe and even life threatening, which is why we do not perform random pancreatic biopsies and only biopsy a lesion/area we feel is concerning enough to warrant the risk.   The risks and benefits of endoscopic evaluation were discussed with the patient; these include but are not limited to the risk of perforation,  infection, bleeding, missed lesions, lack of diagnosis, severe illness requiring hospitalization, as well as anesthesia and sedation related illnesses.  The patient is agreeable to proceed.    Justice Britain, MD [facility] Office # [phone]

## 2020-07-04 NOTE — Anesthesia Postprocedure Evaluation (Signed)
Anesthesia Post Note  Patient: Patty [name]  Procedure(s) Performed: ESOPHAGOGASTRODUODENOSCOPY (EGD) WITH PROPOFOL (N/A ) UPPER ESOPHAGEAL ENDOSCOPIC ULTRASOUND (EUS) (N/A ) BIOPSY     Patient location during evaluation: PACU Anesthesia Type: MAC Level of consciousness: awake and alert Pain management: pain level controlled Vital Signs Assessment: post-procedure vital signs reviewed and stable Respiratory status: spontaneous breathing, nonlabored ventilation and respiratory function stable Cardiovascular status: blood pressure returned to baseline and stable Postop Assessment: no apparent nausea or vomiting Anesthetic complications: no   No complications documented.  Last Vitals:  Vitals:   07/04/20 1239 07/04/20 1248  BP: (!) 149/80   Pulse: 72 67  Resp: 11 19  Temp:    SpO2: 100% 99%    Last Pain:  Vitals:   07/04/20 1220  TempSrc: Temporal  PainSc: 0-No pain                 Pervis Hocking

## 2020-07-04 NOTE — Discharge Instructions (Signed)
YOU HAD AN ENDOSCOPIC PROCEDURE TODAY: Refer to the procedure report and other information in the discharge instructions given to you for any specific questions about what was found during the examination. If this information does not answer your questions, please call [place] office at [phone] to clarify.  ° °YOU SHOULD EXPECT: Some feelings of bloating in the abdomen. Passage of more gas than usual. Walking can help get rid of the air that was put into your GI tract during the procedure and reduce the bloating. If you had a lower endoscopy (such as a colonoscopy or flexible sigmoidoscopy) you may notice spotting of blood in your stool or on the toilet paper. Some abdominal soreness may be present for a day or two, also. ° °DIET: Your first meal following the procedure should be a light meal and then it is ok to progress to your normal diet. A half-sandwich or bowl of soup is an example of a good first meal. Heavy or fried foods are harder to digest and may make you feel nauseous or bloated. Drink plenty of fluids but you should avoid alcoholic beverages for 24 hours. If you had a esophageal dilation, please see attached instructions for diet.   ° °ACTIVITY: Your care partner should take you home directly after the procedure. You should plan to take it easy, moving slowly for the rest of the day. You can resume normal activity the day after the procedure however YOU SHOULD NOT DRIVE, use power tools, machinery or perform tasks that involve climbing or major physical exertion for 24 hours (because of the sedation medicines used during the test).  ° °SYMPTOMS TO REPORT IMMEDIATELY: °A gastroenterologist can be reached at any hour. Please call [phone]  for any of the following symptoms:  °Following lower endoscopy (colonoscopy, flexible sigmoidoscopy) °Excessive amounts of blood in the stool  °Significant tenderness, worsening of abdominal pains  °Swelling of the abdomen that is new, acute  °Fever of 100° or  higher  °Following upper endoscopy (EGD, EUS, ERCP, esophageal dilation) °Vomiting of blood or coffee ground material  °New, significant abdominal pain  °New, significant chest pain or pain under the shoulder blades  °Painful or persistently difficult swallowing  °New shortness of breath  °Black, tarry-looking or red, bloody stools ° °FOLLOW UP:  °If any biopsies were taken you will be contacted by phone or by letter within the next 1-3 weeks. Call [phone]  if you have not heard about the biopsies in 3 weeks.  °Please also call with any specific questions about appointments or follow up tests. ° °

## 2020-07-04 NOTE — Op Note (Signed)
[facility] Patient Name: Patty [name] Procedure Date : 07/04/2020 MRN: [id number] Attending MD: Justice Britain , MD Date of Birth: Jul 28, 1955 CSN: [id number] Age: 64 Admit Type: Outpatient Procedure:                Upper EUS Indications:              Suspected mass in pancreas on CT scan, Epigastric                            abdominal pain, Dyspepsia, Indigestion, Personal                            history of peptic ulcer disease Providers:                Justice Britain, MD, Jeanella Cara, RN,                            Cletis Athens, Technician Referring MD:             Lavone Nian Lansford, Luvenia Starch L. Breeback Medicines:                Monitored Anesthesia Care Complications:            No immediate complications. Estimated Blood Loss:     Estimated blood loss was minimal. Procedure:                Pre-Anesthesia Assessment:                           - Prior to the procedure, a History and Physical                            was performed, and patient medications and                            allergies were reviewed. The patient's tolerance of                            previous anesthesia was also reviewed. The risks                            and benefits of the procedure and the sedation                            options and risks were discussed with the patient.                            All questions were answered, and informed consent                            was obtained. Prior Anticoagulants: The patient has                            taken no previous anticoagulant or antiplatelet  agents except for NSAID medication. ASA Grade                            Assessment: II - A patient with mild systemic                            disease. After reviewing the risks and benefits,                            the patient was deemed in satisfactory condition to                            undergo the procedure.                            After obtaining informed consent, the endoscope was                            passed under direct vision. Throughout the                            procedure, the patient's blood pressure, pulse, and                            oxygen saturations were monitored continuously. The                            [id number]-H190 ([id number]) Olympus gastroscope was                            introduced through the mouth, and advanced to the                            second part of duodenum. The [id number]-[id number] ([id number])                            Olympus duodenoscope was introduced through the                            mouth, and advanced to the second part of duodenum.                            The [id number] ([id number]) Olympus Linear EUS was                            introduced through the mouth, and advanced to the                            duodenum for ultrasound examination from the                            stomach and duodenum. The upper EUS was  accomplished without difficulty. The patient                            tolerated the procedure. Scope In: Scope Out: Findings:      ENDOSCOPIC FINDING: :      No gross lesions were noted in the entire esophagus.      The Z-line was regular and was found 39 cm from the incisors.      A 1 cm hiatal hernia was present.      Patchy severe inflammation with hemorrhage characterized by erosions,       erythema, friability and granularity was found in the entire examined       stomach.      Few non-bleeding cratered gastric ulcers with a clean ulcer base       (Forrest Class III) were found in the gastric body, at the incisura and       in the gastric antrum. The largest lesion was 9 mm in largest dimension.      No other gross lesions were noted in the entire examined stomach.       Biopsies were taken with a cold forceps for histology and Helicobacter       pylori testing.      No gross lesions were noted in the duodenal  bulb, in the first portion       of the duodenum and in the second portion of the duodenum. Biopsies for       histology were taken with a cold forceps for evaluation of celiac       disease.      The ampulla was normal.      ENDOSONOGRAPHIC FINDING: :      Pancreatic parenchymal abnormalities were noted in the entire pancreas.       These consisted of diffuse echogenicity but no masses or lesions noted.      The pancreatic duct had a normal endosonographic appearance in the       pancreatic head (PD = 1.6 mm), genu of the pancreas (PD = 1.6 mm -> 1.0       mm), body of the pancreas (PD = 0.9 mm) and tail of the pancreas (PD =       0.7 mm).      There was no sign of significant endosonographic abnormality in the       common bile duct (3.8 mm).      A small amount of hyperechoic material consistent with sludge was       visualized endosonographically in the gallbladder.      Endosonographic imaging of the ampulla showed no extrinsic compression,       intramural (subepithelial) lesion, mass, varices or wall thickening.      Endosonographic imaging in the visualized portion of the liver showed no       mass.      No malignant-appearing lymph nodes were visualized in the celiac region       (level 20), peripancreatic region and porta hepatis region.      The celiac region was visualized.      Passage of the ERCP scope required some manipulation and flexion of       head. After that the EUS scope passed with ease. After completion of       EUS, I relooked with the EGD scope and saw a small mucosal wrent just  below the UES suggestive of a previous ring that dilated with the       passage of the scope - no evidence of perforation. Impression:               EGD Impression:                           - No gross lesions in esophagus. After completion                            of EUS, EGD scope reinserted and noted a mucosal                            wrent just below UES from scope  passage but no                            perforation.                           - Z-line regular, 39 cm from the incisors.                           - 1 cm hiatal hernia.                           - Gastritis with hemorrhage. Non-bleeding gastric                            ulcers with a clean ulcer base (Forrest Class III).                            No other gross lesions in the stomach. Biopsied.                           - No gross lesions in the duodenal bulb, in the                            first portion of the duodenum and in the second                            portion of the duodenum. Biopsied.                           - Normal ampulla.                           EUS Impression:                           - Pancreatic parenchymal abnormalities consisting                            of diffuse echogenicity were noted in the entire  pancreas. The pancreatic duct had a normal                            endosonographic appearance in the pancreatic head,                            genu of the pancreas, body of the pancreas and tail                            of the pancreas. No mass or lesion noted in                            pancreas.                           - There was no sign of significant pathology in the                            common bile duct.                           - Hyperechoic material consistent with sludge was                            visualized endosonographically in the gallbladder.                           - Normal ampulla.                           - No malignant-appearing lymph nodes were                            visualized in the celiac region (level 20),                            peripancreatic region and porta hepatis region. Recommendation:           - The patient will be observed post-procedure,                            until all discharge criteria are met.                           - Discharge patient to home.                            - Patient has a contact number available for                            emergencies. The signs and symptoms of potential                            delayed complications were discussed with the  patient. Return to normal activities tomorrow.                            Written discharge instructions were provided to the                            patient.                           - Resume previous diet.                           - Observe patient's clinical course.                           - Await path results.                           - Start Omeprazole 40 mg twice daily.                           - Repeat the upper endoscopic ultrasound in 3-4                            months to check healing.                           - The findings and recommendations were discussed                            with the patient.                           - Attempted to discuss the findings and                            recommendations were discussed with the patient's                            family but unable to reach. Procedure Code(s):        --- Professional ---                           [phone], Esophagogastroduodenoscopy, flexible,                            transoral; with endoscopic ultrasound examination                            limited to the esophagus, stomach or duodenum, and                            adjacent structures                           43239, Esophagogastroduodenoscopy, flexible,  transoral; with biopsy, single or multiple Diagnosis Code(s):        --- Professional ---                           K44.9, Diaphragmatic hernia without obstruction or                            gangrene                           K29.71, Gastritis, unspecified, with bleeding                           K25.9, Gastric ulcer, unspecified as acute or                            chronic, without hemorrhage or perforation                            K86.9, Disease of pancreas, unspecified                           I89.9, Noninfective disorder of lymphatic vessels                            and lymph nodes, unspecified                           R10.13, Epigastric pain                           K30, Functional dyspepsia                           Z87.11, Personal history of peptic ulcer disease                           K83.8, Other specified diseases of biliary tract                           R93.3, Abnormal findings on diagnostic imaging of                            other parts of digestive tract CPT copyright 2019 [employer]. All rights reserved. The codes documented in this report are preliminary and upon coder review may  be revised to meet current compliance requirements. Justice Britain, MD 07/04/2020 12:38:36 PM Number of Addenda: 0

## 2020-07-04 NOTE — Anesthesia Preprocedure Evaluation (Addendum)
Anesthesia Evaluation    Reviewed: Allergy & Precautions, Patient's Chart, lab work & pertinent test results  Airway Mallampati: II  TM Distance: >3 FB Neck ROM: Full    Dental no notable dental hx. (+) Teeth Intact, Dental Advisory Given   Pulmonary pneumonia (CT A/P ground glass opacitis c/w COVID PNA), resolved,  August 02/2020 hospitalized for COVID, per pt has fully recovered   Pulmonary exam normal breath sounds clear to auscultation       Cardiovascular hypertension (has not been taking meds from [place] ), Normal cardiovascular exam Rhythm:Regular Rate:Normal     Neuro/Psych PSYCHIATRIC DISORDERS Anxiety negative neurological ROS     GI/Hepatic Neg liver ROS, hiatal hernia (small), PUD, GERD  Controlled,Hx barretts, gastric ulcer- abnormal CT A/P: Rounded density measuring 3.2 cm of the pancreatic tail is isodense to adjacent parenchyma, may represent focal tissue but cannot exclude underlying focal lesion. Recommend nonemergent pancreatic protocol MRI for characterization.   Endo/Other  negative endocrine ROS  Renal/GU negative Renal ROS  negative genitourinary   Musculoskeletal negative musculoskeletal ROS (+)   Abdominal   Peds  Hematology negative hematology ROS (+)   Anesthesia Other Findings   Reproductive/Obstetrics negative OB ROS S/p TL                            Anesthesia Physical Anesthesia Plan  ASA: III  Anesthesia Plan: MAC   Post-op Pain Management:    Induction:   PONV Risk Score and Plan: 2 and Propofol infusion and TIVA  Airway Management Planned: Natural Airway and Simple Face Mask  Additional Equipment: None  Intra-op Plan:   Post-operative Plan:   Informed Consent: I have reviewed the patients History and Physical, chart, labs and discussed the procedure including the risks, benefits and alternatives for the proposed anesthesia with the patient or  authorized representative who has indicated his/her understanding and acceptance.       Plan Discussed with: CRNA  Anesthesia Plan Comments:        Anesthesia Quick Evaluation

## 2020-07-04 NOTE — Transfer of Care (Signed)
Immediate Anesthesia Transfer of Care Note  Patient: Patty [name]  Procedure(s) Performed: ESOPHAGOGASTRODUODENOSCOPY (EGD) WITH PROPOFOL (N/A ) UPPER ESOPHAGEAL ENDOSCOPIC ULTRASOUND (EUS) (N/A ) BIOPSY  Patient Location: Endoscopy Unit  Anesthesia Type:MAC  Level of Consciousness: drowsy and patient cooperative  Airway & Oxygen Therapy: Patient Spontanous Breathing and Patient connected to nasal cannula oxygen  Post-op Assessment: Report given to RN and Post -op Vital signs reviewed and stable  Post vital signs: Reviewed and stable  Last Vitals:  Vitals Value Taken Time  BP 134/90 07/04/20 1220  Temp 37.2 C 07/04/20 1220  Pulse 74 07/04/20 1221  Resp 20 07/04/20 1221  SpO2 96 % 07/04/20 1221  Vitals shown include unvalidated device data.  Last Pain:  Vitals:   07/04/20 1220  TempSrc: Temporal  PainSc: 0-No pain         Complications: No complications documented.

## 2020-07-08 ENCOUNTER — Other Ambulatory Visit: Payer: Self-pay | Admitting: Physician Assistant

## 2020-07-08 DIAGNOSIS — F450 Somatization disorder: Secondary | ICD-10-CM

## 2020-07-08 DIAGNOSIS — F5101 Primary insomnia: Secondary | ICD-10-CM

## 2020-07-08 LAB — SURGICAL PATHOLOGY

## 2020-07-09 ENCOUNTER — Encounter: Payer: Self-pay | Admitting: Physician Assistant

## 2020-07-09 ENCOUNTER — Other Ambulatory Visit: Payer: Self-pay

## 2020-07-09 MED ORDER — ROSUVASTATIN CALCIUM 10 MG PO TABS: 10.0000 mg | ORAL_TABLET | Freq: Every day | ORAL | 3 refills | Status: DC

## 2020-07-09 MED ORDER — SUCRALFATE 1 GM/10ML PO SUSP: 1.0000 g | Freq: Three times a day (TID) | ORAL | 1 refills | Status: DC

## 2020-07-09 MED ORDER — ESOMEPRAZOLE MAGNESIUM 40 MG PO CPDR: 40.0000 mg | DELAYED_RELEASE_CAPSULE | Freq: Two times a day (BID) | ORAL | 6 refills | Status: DC

## 2020-07-09 NOTE — Telephone Encounter (Signed)
Prescriptions have been sent into the pharmacy.

## 2020-07-10 MED ORDER — ALPRAZOLAM 0.5 MG PO TABS: 1.0000 mg | ORAL_TABLET | Freq: Every day | ORAL | 0 refills | Status: DC

## 2020-07-14 ENCOUNTER — Encounter: Payer: Self-pay | Admitting: Gastroenterology

## 2020-07-18 ENCOUNTER — Telehealth: Payer: Self-pay

## 2020-07-18 MED ORDER — SUCRALFATE 1 G PO TABS: ORAL_TABLET | ORAL | 1 refills | Status: DC

## 2020-07-18 NOTE — Telephone Encounter (Signed)
Patient returned call and was advised to take one tablet four times per day. Patient aware if she is unable to swallow, you can crush one tablet and dissolve in 92ml of warm water, mix well to create slurry and drink four times daily.  Patient agreed to plan and verbalized understanding.  No further questions.

## 2020-07-18 NOTE — Telephone Encounter (Signed)
Left message on patient's voicemail to return call

## 2020-07-18 NOTE — Telephone Encounter (Signed)
Tablets are OK. Thank you.

## 2020-07-18 NOTE — Telephone Encounter (Signed)
Patient is requesting new Rx sent in for sucralfate tablets instead of the liquid.  Please advise.

## 2020-07-19 NOTE — Telephone Encounter (Signed)
Rx for tablets were sent to the pharmacy on 07/18/20

## 2020-07-23 ENCOUNTER — Other Ambulatory Visit: Payer: Self-pay

## 2020-07-23 ENCOUNTER — Ambulatory Visit (INDEPENDENT_AMBULATORY_CARE_PROVIDER_SITE_OTHER): Payer: Medicare Other | Admitting: Physician Assistant

## 2020-07-23 ENCOUNTER — Encounter: Payer: Self-pay | Admitting: Physician Assistant

## 2020-07-23 VITALS — BP 147/89 | HR 86 | Wt 140.5 lb

## 2020-07-23 DIAGNOSIS — Z79899 Other long term (current) drug therapy: Secondary | ICD-10-CM

## 2020-07-23 DIAGNOSIS — F450 Somatization disorder: Secondary | ICD-10-CM

## 2020-07-23 DIAGNOSIS — T466X5A Adverse effect of antihyperlipidemic and antiarteriosclerotic drugs, initial encounter: Secondary | ICD-10-CM

## 2020-07-23 DIAGNOSIS — Z131 Encounter for screening for diabetes mellitus: Secondary | ICD-10-CM | POA: Diagnosis not present

## 2020-07-23 DIAGNOSIS — F5101 Primary insomnia: Secondary | ICD-10-CM | POA: Diagnosis not present

## 2020-07-23 DIAGNOSIS — E781 Pure hyperglyceridemia: Secondary | ICD-10-CM

## 2020-07-23 DIAGNOSIS — K2960 Other gastritis without bleeding: Secondary | ICD-10-CM | POA: Diagnosis not present

## 2020-07-23 DIAGNOSIS — E785 Hyperlipidemia, unspecified: Secondary | ICD-10-CM | POA: Diagnosis not present

## 2020-07-23 DIAGNOSIS — I10 Essential (primary) hypertension: Secondary | ICD-10-CM | POA: Diagnosis not present

## 2020-07-23 DIAGNOSIS — M791 Myalgia, unspecified site: Secondary | ICD-10-CM

## 2020-07-23 LAB — POCT GLYCOSYLATED HEMOGLOBIN (HGB A1C): HbA1c POC (<> result, manual entry): 5.6 % (ref 4.0–5.6)

## 2020-07-23 MED ORDER — HYDROCHLOROTHIAZIDE 12.5 MG PO TABS: 12.5000 mg | ORAL_TABLET | Freq: Every day | ORAL | 2 refills | Status: DC

## 2020-07-23 MED ORDER — ALPRAZOLAM 0.5 MG PO TABS: 1.0000 mg | ORAL_TABLET | Freq: Every day | ORAL | 5 refills | Status: DC

## 2020-07-23 NOTE — Progress Notes (Signed)
Subjective:    Patient ID: Patty [name], female    DOB: 1956/05/25, 65 y.o.   MRN: [id number]  HPI  Patient is a 65 year old female with history of COVID, recent diagnosis of erosive gastritis, hyperlipidemia, hypertriglyceridemia who presents to the clinic for 49-month follow-up.  Patient has been diagnosed by GI with erosive gastritis.  She is on a PPI and Carafate.  She does feel like her symptoms are better.  Patient does report myalgias with her Crestor.  She wants to know what the next option is.  Since her cholesterol is still high.  She denies any chest pains, shortness of breath, headache.  She is checking her BP at home and running 140's most of the time.   .. Active Ambulatory Problems    Diagnosis Date Noted  . COLD SORE 05/24/2007  . FIBROIDS, UTERUS 05/24/2007  . 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 GERD 04/11/2009  . ARTHRITIS 04/18/2009  . CHEST PAIN, ATYPICAL 05/24/2007  . ABDOMINAL PAIN, EPIGASTRIC 12/19/2008  . ABDOMINAL PAIN, GENERALIZED 04/18/2009  . ABDOMINAL PAIN, SUPRAPUBIC 12/19/2008  . Acute respiratory failure due to COVID-19 ([place]) 03/05/2020  . Pneumonia due to COVID-19 virus 03/06/2020  . Primary hypertension 03/06/2020  . Dyslipidemia (high LDL; low HDL) 03/06/2020  . Thrombocytopenia ([place]) 03/06/2020  . History of acute respiratory failure 03/18/2020  . Pancreatic mass 04/03/2020  . Aortic atherosclerosis ([place]) 04/03/2020  . History of COVID-19 04/23/2020  . Serum albumin decreased 04/23/2020  . Neck pain 04/23/2020  . Dysuria 05/29/2020  . Primary insomnia 05/29/2020  . Needle exposure, initial encounter 06/14/2020  . Need for hepatitis C screening test 06/14/2020  . Need for hepatitis B screening test 06/14/2020  . Encounter for screening for human immunodeficiency virus (HIV) 06/14/2020  . Elevated liver enzymes 06/17/2020  . Elevated fasting glucose 06/17/2020  . Hypertriglyceridemia 07/23/2020   . Erosive gastritis 07/23/2020  . Myalgia due to statin 07/24/2020   Resolved Ambulatory Problems    Diagnosis Date Noted  . HEMATURIA UNSPECIFIED 12/19/2008  . FOOT PAIN, LEFT 05/24/2007   Past Medical History:  Diagnosis Date  . Anxiety   . Barrett's esophagus   . Cardiomegaly   . Dyspareunia   . Fatty pancreas   . Gastric ulcer   . GERD (gastroesophageal reflux disease)   . Hx of colonic polyps   . Hyperlipidemia   . Hypertension   . Osteopenia   . Osteoporosis   . Shingles   . Sleep apnea   . Thyroid nodule   . Uterine leiomyoma        Review of Systems See HPI.     Objective:   Physical Exam Vitals reviewed.  Constitutional:      Appearance: Normal appearance. She is obese.  Neck:     Vascular: No carotid bruit.  Cardiovascular:     Rate and Rhythm: Normal rate and regular rhythm.     Pulses: Normal pulses.     Heart sounds: Normal heart sounds.  Pulmonary:     Effort: Pulmonary effort is normal.     Breath sounds: Normal breath sounds.  Musculoskeletal:     Right lower leg: No edema.     Left lower leg: No edema.  Neurological:     General: No focal deficit present.     Mental Status: She is alert and oriented to person, place, and time.  Psychiatric:        Mood and Affect: Mood normal.  Assessment & Plan:  Marland KitchenMarland KitchenDiagnoses and all orders for this visit:  Primary hypertension -     hydrochlorothiazide (HYDRODIURIL) 12.5 MG tablet; Take 1 tablet (12.5 mg total) by mouth daily.  Erosive gastritis  Dyslipidemia (high LDL; low HDL)  Primary insomnia -     ALPRAZolam (XANAX) 0.5 MG tablet; Take 2 tablets (1 mg total) by mouth at bedtime.  Somatization disorder -     ALPRAZolam (XANAX) 0.5 MG tablet; Take 2 tablets (1 mg total) by mouth at bedtime.  Hypertriglyceridemia  Medication management -     COMPLETE METABOLIC PANEL WITH GFR  Screening for diabetes mellitus -     POCT glycosylated hemoglobin (Hb A1C)  Myalgia due to  statin   .Marland Kitchen Results for orders placed or performed in visit on 07/23/20  POCT glycosylated hemoglobin (Hb A1C)  Result Value Ref Range   Hemoglobin A1C     HbA1c POC (<> result, manual entry) 5.6 4.0 - 5.6 %   HbA1c, POC (prediabetic range)     HbA1c, POC (controlled diabetic range)     A!C normal but close to pre-diabetes. Need to watch sugars and carbs now. Walk daily.   For crestor cut in half and take every other day and see if tolerates better. If not let me know. Recheck in 6 months.   BP not to goal. Discussed risk of CV event. Started HCTZ and discussed side effects.  Recheck CMP in 2 weeks.  Follow up in office in 6 weeks.   Elevated liver enzymes-recheck cmp.   Xanax refilled for sleep.

## 2020-07-23 NOTE — Patient Instructions (Addendum)
Take 1/2 tablet for crestor every other day and see if tolerates better.  Start HCTZ once a day in the morning.   High Triglycerides Eating Plan Triglycerides are a type of fat in the blood. High levels of triglycerides can increase your risk of heart disease and stroke. If your triglyceride levels are high, choosing the right foods can help lower your triglycerides and keep your heart healthy. Work with your health care provider or a diet and nutrition specialist (dietitian) to develop an eating plan that is right for you. What are tips for following this plan? General guidelines  Lose weight, if you are overweight. For most people, losing 5-10 lbs (2-5 kg) helps lower triglyceride levels. A weight-loss plan may include. ? 30 minutes of exercise at least 5 days a week. ? Reducing the amount of calories, sugar, and fat you eat.  Eat a wide variety of fresh fruits, vegetables, and whole grains. These foods are high in fiber.  Eat foods that contain healthy fats, such as fatty fish, nuts, seeds, and olive oil.  Avoid foods that are high in added sugar, added salt (sodium), saturated fat, and trans fat.  Avoid low-fiber, refined carbohydrates such as white bread, crackers, noodles, and white rice.  Avoid foods with partially hydrogenated oils (trans fats), such as fried foods or stick margarine.  Limit alcohol intake to no more than 1 drink a day for nonpregnant women and 2 drinks a day for men. One drink equals 12 oz of beer, 5 oz of wine, or 1 oz of hard liquor. Your health care provider may recommend that you drink less depending on your overall health.   Reading food labels  Check food labels for the amount of saturated fat. Choose foods with no or very little saturated fat.  Check food labels for the amount of trans fat. Choose foods with no trans fat.  Check food labels for the amount of cholesterol. Choose foods low in cholesterol. Ask your dietitian how much cholesterol you should  have each day.  Check food labels for the amount of sodium. Choose foods with less than 140 milligrams (mg) per serving. Shopping  Buy dairy products labeled as nonfat (skim) or low-fat (1%).  Avoid buying processed or prepackaged foods. These are often high in added sugar, sodium, and fat. Cooking  Choose healthy fats when cooking, such as olive oil or canola oil.  Cook foods using lower fat methods, such as baking, broiling, boiling, or grilling.  Make your own sauces, dressings, and marinades when possible, instead of buying them. Store-bought sauces, dressings, and marinades are often high in sodium and sugar. Meal planning  Eat more home-cooked food and less restaurant, buffet, and fast food.  Eat fatty fish at least 2 times each week. Examples of fatty fish include salmon, trout, mackerel, tuna, and herring.  If you eat whole eggs, do not eat more than 3 egg yolks per week. What foods are recommended? The items listed may not be a complete list. Talk with your dietitian about what dietary choices are best for you. Grains Whole wheat or whole grain breads, crackers, cereals, and pasta. Unsweetened oatmeal. Bulgur. Barley. Quinoa. Brown rice. Whole wheat flour tortillas. Vegetables Fresh or frozen vegetables. Low-sodium canned vegetables. Fruits All fresh, canned (in natural juice), or frozen fruits. Meats and other protein foods Skinless chicken or [place]. Ground chicken or [place]. Lean cuts of pork, trimmed of fat. Fish and seafood, especially salmon, trout, and herring. Egg whites. Dried beans, peas,  or lentils. Unsalted nuts or seeds. Unsalted canned beans. Natural peanut or almond butter. Dairy Low-fat dairy products. Skim or low-fat (1%) milk. Reduced fat (2%) and low-sodium cheese. Low-fat ricotta cheese. Low-fat cottage cheese. Plain, low-fat yogurt. Fats and oils Tub margarine without trans fats. Light or reduced-fat mayonnaise. Light or reduced-fat salad dressings.  Avocado. Safflower, olive, sunflower, soybean, and canola oils. What foods are not recommended? The items listed may not be a complete list. Talk with your dietitian about what dietary choices are best for you. Grains White bread. White (regular) pasta. White rice. Cornbread. Bagels. Pastries. Crackers that contain trans fat. Vegetables Creamed or fried vegetables. Vegetables in a cheese sauce. Fruits Sweetened dried fruit. Canned fruit in syrup. Fruit juice. Meats and other protein foods Fatty cuts of meat. Ribs. Chicken wings. Berniece Salines. Sausage. Bologna. Salami. Chitterlings. Fatback. Hot dogs. Bratwurst. Packaged lunch meats. Dairy Whole or reduced-fat (2%) milk. Half-and-half. Cream cheese. Full-fat or sweetened yogurt. Full-fat cheese. Nondairy creamers. Whipped toppings. Processed cheese or cheese spreads. Cheese curds. Beverages Alcohol. Sweetened drinks, such as soda, lemonade, fruit drinks, or punches. Fats and oils Butter. Stick margarine. Lard. Shortening. Ghee. Bacon fat. Tropical oils, such as coconut, palm kernel, or palm oils. Sweets and desserts Corn syrup. Sugars. Honey. Molasses. Candy. Jam and jelly. Syrup. Sweetened cereals. Cookies. Pies. Cakes. Donuts. Muffins. Ice cream. Condiments Store-bought sauces, dressings, and marinades that are high in sugar, such as ketchup and barbecue sauce. Summary  High levels of triglycerides can increase the risk of heart disease and stroke. Choosing the right foods can help lower your triglycerides.  Eat plenty of fresh fruits, vegetables, and whole grains. Choose low-fat dairy and lean meats. Eat fatty fish at least twice a week.  Avoid processed and prepackaged foods with added sugar, sodium, saturated fat, and trans fat.  If you need suggestions or have questions about what types of food are good for you, talk with your health care provider or a dietitian. This information is not intended to replace advice given to you by your  health care provider. Make sure you discuss any questions you have with your health care provider. Document Revised: 10/25/2019 Document Reviewed: 10/25/2019 Elsevier Patient Education  2021 [employer].

## 2020-07-24 ENCOUNTER — Encounter: Payer: Self-pay | Admitting: Physician Assistant

## 2020-07-24 DIAGNOSIS — M791 Myalgia, unspecified site: Secondary | ICD-10-CM | POA: Insufficient documentation

## 2020-07-24 DIAGNOSIS — T466X5A Adverse effect of antihyperlipidemic and antiarteriosclerotic drugs, initial encounter: Secondary | ICD-10-CM | POA: Insufficient documentation

## 2020-08-19 ENCOUNTER — Other Ambulatory Visit: Payer: Self-pay

## 2020-08-19 DIAGNOSIS — F5101 Primary insomnia: Secondary | ICD-10-CM

## 2020-08-19 DIAGNOSIS — F450 Somatization disorder: Secondary | ICD-10-CM

## 2020-08-19 NOTE — Telephone Encounter (Signed)
Last date written  07/23/2020  Last appointment 07/23/2020

## 2020-08-19 NOTE — Telephone Encounter (Signed)
She has 5 refills on her rx from 1/18 she needs to call pharmacy they should be able to refill.

## 2020-08-20 NOTE — Telephone Encounter (Signed)
Pt is advised of her refill and to contact the pharmacy

## 2020-09-03 ENCOUNTER — Ambulatory Visit: Payer: Medicare Other | Admitting: Physician Assistant

## 2020-09-09 ENCOUNTER — Other Ambulatory Visit: Payer: Self-pay | Admitting: Gastroenterology

## 2020-09-11 ENCOUNTER — Encounter (HOSPITAL_COMMUNITY): Payer: Self-pay | Admitting: Emergency Medicine

## 2020-09-11 ENCOUNTER — Other Ambulatory Visit: Payer: Self-pay

## 2020-09-11 ENCOUNTER — Emergency Department (HOSPITAL_COMMUNITY)
Admission: EM | Admit: 2020-09-11 | Discharge: 2020-09-11 | Discharge status: Against Medical Advice | Payer: Medicare Other | Attending: Emergency Medicine | Admitting: Emergency Medicine

## 2020-09-11 ENCOUNTER — Emergency Department (HOSPITAL_COMMUNITY): Payer: Medicare Other

## 2020-09-11 DIAGNOSIS — I214 Non-ST elevation (NSTEMI) myocardial infarction: Secondary | ICD-10-CM | POA: Diagnosis not present

## 2020-09-11 DIAGNOSIS — R079 Chest pain, unspecified: Secondary | ICD-10-CM | POA: Diagnosis not present

## 2020-09-11 DIAGNOSIS — Z8616 Personal history of COVID-19: Secondary | ICD-10-CM | POA: Diagnosis not present

## 2020-09-11 DIAGNOSIS — I10 Essential (primary) hypertension: Secondary | ICD-10-CM | POA: Diagnosis not present

## 2020-09-11 DIAGNOSIS — R519 Headache, unspecified: Secondary | ICD-10-CM | POA: Diagnosis not present

## 2020-09-11 LAB — TROPONIN I (HIGH SENSITIVITY)
Troponin I (High Sensitivity): 3 ng/L (ref <18)
Troponin I (High Sensitivity): 19 ng/L — ABNORMAL HIGH (ref <18)

## 2020-09-11 LAB — BASIC METABOLIC PANEL
Anion gap: 12 (ref 5–15)
BUN: 17 mg/dL (ref 8–23)
CO2: 22 mmol/L (ref 22–32)
Calcium: 10.2 mg/dL (ref 8.9–10.3)
Chloride: 102 mmol/L (ref 98–111)
Creatinine, Ser: 0.73 mg/dL (ref 0.44–1.00)
GFR, Estimated: >60 mL/min (ref >60)
Glucose, Bld: 127 mg/dL — ABNORMAL HIGH (ref 70–99)
Potassium: 4 mmol/L (ref 3.5–5.1)
Sodium: 136 mmol/L (ref 135–145)

## 2020-09-11 LAB — CBC
HCT: 43.5 % (ref 36.0–46.0)
Hemoglobin: 14.3 g/dL (ref 12.0–15.0)
MCH: 29.7 pg (ref 26.0–34.0)
MCHC: 32.9 g/dL (ref 30.0–36.0)
MCV: 90.4 fL (ref 80.0–100.0)
Platelets: 291 10*3/uL (ref 150–400)
RBC: 4.81 MIL/uL (ref 3.87–5.11)
RDW: 12.4 % (ref 11.5–15.5)
WBC: 6.6 10*3/uL (ref 4.0–10.5)
nRBC: 0 % (ref 0.0–0.2)

## 2020-09-11 NOTE — ED Triage Notes (Signed)
Patient brought down from [place], was visiting another patient when she states her chest started to hurt. Patient states she thinks it was related to emotions because the person she was visiting was dying. Reported BP at onset of pain was 757 systolic per inpatient staff, BP in triage 161/100. Patient currently rates pain 8/10, denies cardiac history. Patient alert, oriented, and in no apparent distress at this time.

## 2020-09-11 NOTE — ED Notes (Signed)
Pt leaving AMA. Understands the risk of leaving. MD spoke with pt. Pt signed AMA form.

## 2020-09-11 NOTE — ED Provider Notes (Signed)
[facility] EMERGENCY DEPARTMENT Provider Note   CSN: [id number] Arrival date & time: 09/11/20  1059     History Chief Complaint  Patient presents with  . Chest Pain    Patty [name] is a 65 y.o. female.  Symptoms started around 10 AM.  The patient states that she currently feels much better.  She also states that she has not yet taken her blood pressure medication today.  The history is provided by the patient.  Chest Pain Pain location:  L chest Pain quality: aching   Pain radiates to:  Does not radiate Pain severity:  Moderate Onset quality:  Sudden Duration:  10 minutes Timing:  Constant Progression:  Resolved Chronicity:  New Context comment:  She was visiting a friend in the hospital, and the friend is dying.  The patient states that she was very upset. Relieved by:  Nothing Worsened by:  Nothing Associated symptoms: headache   Associated symptoms: no abdominal pain, no back pain, no cough, no diaphoresis, no fever, no nausea, no palpitations, no shortness of breath and no vomiting   Risk factors: high cholesterol and hypertension   Risk factors comment:  Father with CAD      Past Medical History:  Diagnosis Date  . Anxiety   . Barrett's esophagus   . Cardiomegaly   . Dyspareunia   . Fatty pancreas   . Gastric ulcer   . GERD (gastroesophageal reflux disease)   . Hx of colonic polyps   . Hyperlipidemia   . Hypertension   . Osteopenia   . Osteoporosis   . Shingles   . Sleep apnea   . Thyroid nodule   . Uterine leiomyoma     Patient Active Problem List   Diagnosis Date Noted  . Myalgia due to statin 07/24/2020  . Hypertriglyceridemia 07/23/2020  . Erosive gastritis 07/23/2020  . Elevated liver enzymes 06/17/2020  . Elevated fasting glucose 06/17/2020  . Needle exposure, initial encounter 06/14/2020  . Need for hepatitis C screening test 06/14/2020  . Need for hepatitis B screening test 06/14/2020  . Encounter for screening for  human immunodeficiency virus (HIV) 06/14/2020  . Dysuria 05/29/2020  . Primary insomnia 05/29/2020  . History of COVID-19 04/23/2020  . Serum albumin decreased 04/23/2020  . Neck pain 04/23/2020  . Pancreatic mass 04/03/2020  . Aortic atherosclerosis ([place]) 04/03/2020  . History of acute respiratory failure 03/18/2020  . Pneumonia due to COVID-19 virus 03/06/2020  . Primary hypertension 03/06/2020  . Dyslipidemia (high LDL; low HDL) 03/06/2020  . Thrombocytopenia ([place]) 03/06/2020  . Acute respiratory failure due to COVID-19 ([place]) 03/05/2020  . 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 ARTHRITIS 04/18/2009  . ABDOMINAL PAIN, GENERALIZED 04/18/2009  . GERD 04/11/2009  . ABDOMINAL PAIN, EPIGASTRIC 12/19/2008  . ABDOMINAL PAIN, SUPRAPUBIC 12/19/2008  . COLD SORE 05/24/2007  . FIBROIDS, UTERUS 05/24/2007  . CHEST PAIN, ATYPICAL 05/24/2007    Past Surgical History:  Procedure Laterality Date  . BIOPSY  07/04/2020   Procedure: BIOPSY;  Surgeon: Rush Landmark Telford Nab., MD;  Location: [facility];  Service: Gastroenterology;;  . BIOPSY THYROID    . COLONOSCOPY W/ POLYPECTOMY    . ESOPHAGOGASTRODUODENOSCOPY (EGD) WITH PROPOFOL N/A 07/04/2020   Procedure: ESOPHAGOGASTRODUODENOSCOPY (EGD) WITH PROPOFOL;  Surgeon: Rush Landmark Telford Nab., MD;  Location: [facility];  Service: Gastroenterology;  Laterality: N/A;  . TUBAL LIGATION    . UPPER ESOPHAGEAL ENDOSCOPIC ULTRASOUND (EUS) N/A 07/04/2020   Procedure: UPPER ESOPHAGEAL ENDOSCOPIC  ULTRASOUND (EUS);  Surgeon: Irving Copas., MD;  Location: [facility];  Service: Gastroenterology;  Laterality: N/A;     OB History    Gravida  2   Para  2   Term  2   Preterm      AB      Living  2     SAB      IAB      Ectopic      Multiple      Live Births  2           Family History  Problem Relation Age of Onset  . Hypertension Mother   . Heart disease Mother   . Heart  disease Father   . Hypertension Brother   . Colon cancer Neg Hx   . Esophageal cancer Neg Hx   . Pancreatic cancer Neg Hx   . Stomach cancer Neg Hx   . Liver disease Neg Hx     Social History   Tobacco Use  . Smoking status: Never Smoker  . Smokeless tobacco: Never Used  Vaping Use  . Vaping Use: Never used  Substance Use Topics  . Alcohol use: No    Alcohol/week: 0.0 standard drinks  . Drug use: No    Home Medications Prior to Admission medications   Medication Sig Start Date End Date Taking? Authorizing Provider  acetaminophen (TYLENOL) 500 MG tablet Take 500-1,000 mg by mouth every 6 (six) hours as needed (for pain.).    [provider]  ALPRAZolam Duanne Moron) 0.5 MG tablet Take 2 tablets (1 mg total) by mouth at bedtime. 07/23/20   Breeback, Luvenia Starch L, PA-C  bismuth subsalicylate (PEPTO BISMOL) 262 MG/15ML suspension Take 30 mLs by mouth every 4 (four) hours as needed (upset stomach).    [provider]  esomeprazole (NEXIUM) 40 MG capsule Take 1 capsule (40 mg total) by mouth in the morning and at bedtime. 07/09/20   Mansouraty, Telford Nab., MD  hydrochlorothiazide (HYDRODIURIL) 12.5 MG tablet Take 1 tablet (12.5 mg total) by mouth daily. 07/23/20   Breeback, Jade L, PA-C  ibuprofen (ADVIL) 200 MG tablet Take 200-400 mg by mouth every 8 (eight) hours as needed (pain.).    [provider]  rosuvastatin (CRESTOR) 10 MG tablet Take 1 tablet (10 mg total) by mouth daily. Patient not taking: Reported on 07/23/2020 07/09/20   Iran Planas L, PA-C  sucralfate (CARAFATE) 1 g tablet TAKE ONE TABLET FOUR TIMES PER DAY. IF UNABLE TO SWALLOW, YOU CAN CRUSH ONE TABLET AND DISSOLVE IN 10ML OF WARM WATER, MIX WELL TO CREATE SLURRY AND DRINK FOUR TIMES DAILY 09/09/20   Mansouraty, Telford Nab., MD    Allergies    Patient has no known allergies.  Review of Systems   Review of Systems  Constitutional: Negative for chills, diaphoresis and fever.  HENT: Negative for ear pain  and sore throat.   Eyes: Negative for pain and visual disturbance.  Respiratory: Negative for cough and shortness of breath.   Cardiovascular: Positive for chest pain. Negative for palpitations.  Gastrointestinal: Negative for abdominal pain, nausea and vomiting.  Genitourinary: Negative for dysuria and hematuria.  Musculoskeletal: Negative for arthralgias and back pain.  Skin: Negative for color change and rash.  Neurological: Positive for headaches. Negative for seizures and syncope.  All other systems reviewed and are negative.   Physical Exam Updated Vital Signs BP (!) 151/104   Pulse 92   Temp 98.4 F (36.9 C)   Resp 17  LMP 07/06/2008   SpO2 97%   Physical Exam Vitals and nursing note reviewed.  Constitutional:      General: She is not in acute distress.    Appearance: She is well-developed and well-nourished.  HENT:     Head: Normocephalic and atraumatic.  Eyes:     Conjunctiva/sclera: Conjunctivae normal.  Cardiovascular:     Rate and Rhythm: Normal rate and regular rhythm.     Heart sounds: No murmur heard.   Pulmonary:     Effort: Pulmonary effort is normal. No respiratory distress.     Breath sounds: Normal breath sounds.  Abdominal:     Palpations: Abdomen is soft.     Tenderness: There is no abdominal tenderness.  Musculoskeletal:        General: No edema.     Cervical back: Neck supple.  Skin:    General: Skin is warm and dry.  Neurological:     General: No focal deficit present.     Mental Status: She is alert.  Psychiatric:        Mood and Affect: Mood and affect normal.     ED Results / Procedures / Treatments   Labs (all labs ordered are listed, but only abnormal results are displayed) Labs Reviewed  BASIC METABOLIC PANEL - Abnormal; Notable for the following components:      Result Value   Glucose, Bld 127 (*)    All other components within normal limits  TROPONIN I (HIGH SENSITIVITY) - Abnormal; Notable for the following components:    Troponin I (High Sensitivity) 19 (*)    All other components within normal limits  CBC  TROPONIN I (HIGH SENSITIVITY)    EKG None Normal axis No acute ischemic changes Radiology DG Chest 2 View  Result Date: 09/11/2020 CLINICAL DATA:  Chest pain EXAM: CHEST - 2 VIEW COMPARISON:  Two-view chest x-ray 04/02/2020 FINDINGS: Heart size normal. Previous airspace disease has cleared. No edema or effusion is present. No focal airspace disease is noted. Mild degenerative changes in the thoracolumbar spine are stable. IMPRESSION: No acute cardiopulmonary disease. Electronically Signed   By: San Morelle M.D.   On: 09/11/2020 12:13    Procedures Procedures   Medications Ordered in ED Medications - No data to display  ED Course  I have reviewed the triage vital signs and the nursing notes.  Pertinent labs & imaging results that were available during my care of the patient were reviewed by me and considered in my medical decision making (see chart for details).    MDM Rules/Calculators/A&P                          Patient is 65 years old.  She has a history of hypertension and hyperlipidemia.  She presents with chest pain and hypertension.  She was evaluated and risk stratified according to the heart pathway.  Her second troponin was elevated relative to her first, and she meets criteria for admission.  Unfortunately, the patient was hesitant to stay in the hospital.  She stated that when she was hospitalized for COVID-19, she did not get enough sleep, and it made her whole illness worse.  She understands that she is probably having a heart attack and that she may be a candidate for intervention such as cardiac catheterization. She understands that this intervention might help treat possibly fatal causes of her symptoms and that failure to stay could result in worsening or even death.  Her  friend was present during this discussion, and I do think that patient possesses capacity to make  her decision.  She was informed of the risks and benefits of leaving, and she was assured that she can follow-up at any time.  She voiced understanding and plans to return to the hospital if her symptoms persist.  She wanted to try acupuncture first.  Final Clinical Impression(s) / ED Diagnoses Final diagnoses:  NSTEMI (non-ST elevated myocardial infarction) [facility])    Rx / [place] Orders ED Discharge Orders    None       Arnaldo Natal, MD 09/11/20 1621

## 2020-09-12 ENCOUNTER — Ambulatory Visit: Payer: Medicare Other | Admitting: Gastroenterology

## 2020-09-12 ENCOUNTER — Encounter: Payer: Self-pay | Admitting: Physician Assistant

## 2020-09-12 DIAGNOSIS — R778 Other specified abnormalities of plasma proteins: Secondary | ICD-10-CM

## 2020-09-12 DIAGNOSIS — I10 Essential (primary) hypertension: Secondary | ICD-10-CM

## 2020-09-12 DIAGNOSIS — E785 Hyperlipidemia, unspecified: Secondary | ICD-10-CM

## 2020-09-12 DIAGNOSIS — I214 Non-ST elevation (NSTEMI) myocardial infarction: Secondary | ICD-10-CM

## 2020-09-13 DIAGNOSIS — R778 Other specified abnormalities of plasma proteins: Secondary | ICD-10-CM | POA: Diagnosis not present

## 2020-09-14 LAB — CK TOTAL AND CKMB (NOT AT ARMC)
CK, MB: 2 ng/mL (ref 0–5.0)
Relative Index: 2 (ref 0–4.0)
Total CK: 99 U/L (ref 29–143)

## 2020-09-14 LAB — TROPONIN I: Troponin I: 5 ng/L (ref <=47)

## 2020-09-16 NOTE — Telephone Encounter (Signed)
Troponin normal range and low.  CKMB low.

## 2020-09-18 ENCOUNTER — Ambulatory Visit (INDEPENDENT_AMBULATORY_CARE_PROVIDER_SITE_OTHER): Payer: Medicare Other | Admitting: Physician Assistant

## 2020-09-18 ENCOUNTER — Other Ambulatory Visit: Payer: Self-pay

## 2020-09-18 ENCOUNTER — Encounter: Payer: Self-pay | Admitting: Physician Assistant

## 2020-09-18 VITALS — BP 147/91 | HR 83 | Ht 64.0 in | Wt 140.0 lb

## 2020-09-18 DIAGNOSIS — R7309 Other abnormal glucose: Secondary | ICD-10-CM | POA: Diagnosis not present

## 2020-09-18 DIAGNOSIS — K2960 Other gastritis without bleeding: Secondary | ICD-10-CM

## 2020-09-18 DIAGNOSIS — R0789 Other chest pain: Secondary | ICD-10-CM | POA: Diagnosis not present

## 2020-09-18 DIAGNOSIS — E781 Pure hyperglyceridemia: Secondary | ICD-10-CM

## 2020-09-18 DIAGNOSIS — E785 Hyperlipidemia, unspecified: Secondary | ICD-10-CM

## 2020-09-18 DIAGNOSIS — R778 Other specified abnormalities of plasma proteins: Secondary | ICD-10-CM | POA: Diagnosis not present

## 2020-09-18 DIAGNOSIS — I10 Essential (primary) hypertension: Secondary | ICD-10-CM | POA: Diagnosis not present

## 2020-09-18 DIAGNOSIS — K219 Gastro-esophageal reflux disease without esophagitis: Secondary | ICD-10-CM | POA: Diagnosis not present

## 2020-09-18 MED ORDER — HYDROCHLOROTHIAZIDE 12.5 MG PO TABS: 12.5000 mg | ORAL_TABLET | Freq: Every day | ORAL | 1 refills | Status: DC

## 2020-09-18 MED ORDER — FENOFIBRATE 145 MG PO TABS: 145.0000 mg | ORAL_TABLET | Freq: Every day | ORAL | 3 refills | Status: DC

## 2020-09-18 NOTE — Patient Instructions (Addendum)
Will get stress test.  Start tricor Continue HCTZ every morning.  Take crestor at least once a week or as much as can tolerate prefer every other day or every 3 days.    Preventing Type 2 Diabetes Mellitus Type 2 diabetes, also called type 2 diabetes mellitus, is a long-term (chronic) disease that affects sugar (glucose) levels in your blood. Normally, a hormone called insulin allows glucose to enter cells in your body. The cells use glucose for energy. With type 2 diabetes, you will have one or both of these problems:  Your pancreas does not make enough insulin.  Cells in your body do not respond properly to insulin that your body makes (insulin resistance). Insulin resistance or lack of insulin causes extra glucose to build up in the blood instead of going into cells. As a result, high blood glucose (hyperglycemia) develops. That can cause many complications. Being overweight or obese and having an inactive (sedentary) lifestyle can increase your risk for diabetes. Type 2 diabetes can be delayed or prevented by making certain nutrition and lifestyle changes. How can this condition affect me? If you do not take steps to prevent diabetes, your blood glucose levels may keep increasing over time. Too much glucose in your blood for a long time can damage your blood vessels, heart, kidneys, nerves, and eyes. Type 2 diabetes can lead to chronic health problems and complications, such as:  Heart disease.  Stroke.  Blindness.  Kidney disease.  Depression.  Poor circulation in your feet and legs. In severe cases, a foot or leg may need to be surgically removed (amputated). What can increase my risk? You may be more likely to develop type 2 diabetes if you:  Have type 2 diabetes in your family.  Are overweight or obese.  Have a sedentary lifestyle.  Have insulin resistance or a history of prediabetes.  Have a history of pregnancy-related (gestational) diabetes or polycystic ovary  syndrome (PCOS). What actions can I take to prevent this? It can be difficult to recognize signs of type 2 diabetes. Taking action to prevent the disease before you develop symptoms is the best way to avoid possible damage to your body. Making certain nutrition and lifestyle changes may prevent or delay the disease and related health problems. Nutrition  Eat healthy meals and snacks regularly. Do not skip meals. Fruit or a handful of nuts is a healthy snack between meals.  Drink water throughout the day. Avoid drinks that contain added sugar, such as soda or sweetened tea. Drink enough fluid to keep your urine pale yellow.  Follow instructions from your health care provider about eating or drinking restrictions.  Limit the amount of food you eat by: ? Controlling how much you eat at a time (portion size). ? Checking food labels for the serving sizes of food. ? Using a kitchen scale to weigh amounts of food.  Saut or steam food instead of frying it. Cook with water or broth instead of oils or butter.  Limit saturated fat and salt (sodium) in your diet. Have no more than 1 tsp (2,400 mg) of sodium a day. If you have heart disease or high blood pressure, use less than ? tsp (1,500 mg) of sodium a day.   Lifestyle  Lose weight if needed and as told. Your health care provider can determine how much weight loss is best for you and can help you lose weight safely.  If you are overweight or obese, you may be told to lose at  least 5?7% of your body weight.  Manage blood pressure, cholesterol, and stress. Your health care provider will help determine the best treatment for you.  Do not use any products that contain nicotine or tobacco, such as cigarettes, e-cigarettes, and chewing tobacco. If you need help quitting, ask your health care provider.   Activity  Do physical activity that makes your heart beat faster and makes you sweat (moderate intensity). Do this for at least 30 minutes on at  least 5 days of the week, or as much as told by your health care provider.  Ask your health care provider what activities are safe for you. A mix of activities may be best, such as walking, swimming, cycling, and strength training.  Try to add physical activity into your day. For example: ? Park your car farther away than usual so that you walk more. ? Take a walk during your lunch break. ? Use stairs instead of elevators or escalators. ? Walk or bike to work instead of driving.   Alcohol use If you drink alcohol:  Limit how much you use to: ? 0?1 drink a day for women who are not pregnant. ? 0?2 drinks a day for men.  Be aware of how much alcohol is in your drink. In the [place] one drink equals one 12 oz bottle of beer (355 mL), one 5 oz glass of wine (148 mL), or one 1 oz glass of hard liquor (44 mL). General information  Talk with your health care provider about your risk factors and how you can reduce your risk for diabetes.  Have your blood glucose tested regularly, as told by your health care provider.  Get screening tests as told by your health care provider. You may have these regularly, especially if you have certain risk factors for type 2 diabetes.  Make an appointment with a registered dietitian. This diet and nutrition specialist can help you make a healthy eating plan and help you understand portion sizes and food labels. Where to find support  Ask your health care provider to recommend a registered dietitian, a certified diabetes care and education specialist, or a weight loss program.  Look for local or online weight loss groups.  Join a gym, fitness club, or outdoor activity group, such as a walking club. Where to find more information To learn more about diabetes and diabetes prevention, visit:  American Diabetes Association (ADA): www.diabetes.[employer] of Diabetes and Digestive and Kidney Diseases: [employer] To learn more about healthy  eating, visit:  [place]. Department of Agriculture Scientist, research (physical sciences)): FormerBoss.no  Office of Disease Prevention and Health Promotion (ODPHP): [place] Summary  You can delay or prevent type 2 diabetes by eating healthy foods, losing weight if needed, and increasing your physical activity.  Talk with your health care provider about your risk factors for type 2 diabetes and how you can reduce your risk.  It can be difficult to recognize the signs of type 2 diabetes. The best way to avoid possible damage to your body is to take action to prevent the disease before you develop symptoms.  Get screening tests as told by your health care provider. This information is not intended to replace advice given to you by your health care provider. Make sure you discuss any questions you have with your health care provider. Document Revised: 01/17/2020 Document Reviewed: 01/17/2020 Elsevier Patient Education  [place].

## 2020-09-18 NOTE — Progress Notes (Signed)
Subjective:    Patient ID: Patty [name], female    DOB: [birth year], 65 y.o.   MRN: [id number]  HPI  Pt is a 65 yo female with HTN, dyslipidemia, hypertriglyceridemia who presents to the clinic for follow up.   She went to ED on 3/9 for CP and elevated BP. Her troponin levels were high and they wanted to admit her. She left AMA. She called our office to have troponin recheck. Normal outpatient. Per pt BP 120-130s over 80s at home. CP resolved. She states " I get nervous easy". She is not taking crestor because it "makes her achy". She is getting some readings on her glucometer at home around 150.   She wants to know next steps. Cardiology appt in April.   Gastritis- controlled. On nexium and carafate.   .. Active Ambulatory Problems    Diagnosis Date Noted  . COLD SORE 05/24/2007  . FIBROIDS, UTERUS 05/24/2007  . 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 GERD 04/11/2009  . ARTHRITIS 04/18/2009  . CHEST PAIN, ATYPICAL 05/24/2007  . ABDOMINAL PAIN, EPIGASTRIC 12/19/2008  . ABDOMINAL PAIN, GENERALIZED 04/18/2009  . ABDOMINAL PAIN, SUPRAPUBIC 12/19/2008  . Acute respiratory failure due to COVID-19 ([place]) 03/05/2020  . Pneumonia due to COVID-19 virus 03/06/2020  . Primary hypertension 03/06/2020  . Dyslipidemia (high LDL; low HDL) 03/06/2020  . Thrombocytopenia ([place]) 03/06/2020  . History of acute respiratory failure 03/18/2020  . Pancreatic mass 04/03/2020  . Aortic atherosclerosis ([place]) 04/03/2020  . History of COVID-19 04/23/2020  . Serum albumin decreased 04/23/2020  . Neck pain 04/23/2020  . Dysuria 05/29/2020  . Primary insomnia 05/29/2020  . Needle exposure, initial encounter 06/14/2020  . Need for hepatitis C screening test 06/14/2020  . Need for hepatitis B screening test 06/14/2020  . Encounter for screening for human immunodeficiency virus (HIV) 06/14/2020  . Elevated liver enzymes 06/17/2020  . Elevated fasting glucose 06/17/2020   . Hypertriglyceridemia 07/23/2020  . Erosive gastritis 07/23/2020  . Myalgia due to statin 07/24/2020  . Elevated random blood glucose level 09/18/2020   Resolved Ambulatory Problems    Diagnosis Date Noted  . HEMATURIA UNSPECIFIED 12/19/2008  . FOOT PAIN, LEFT 05/24/2007   Past Medical History:  Diagnosis Date  . Anxiety   . Barrett's esophagus   . Cardiomegaly   . Dyspareunia   . Fatty pancreas   . Gastric ulcer   . GERD (gastroesophageal reflux disease)   . Hx of colonic polyps   . Hyperlipidemia   . Hypertension   . Osteopenia   . Osteoporosis   . Shingles   . Sleep apnea   . Thyroid nodule   . Uterine leiomyoma      Review of Systems  All other systems reviewed and are negative.      Objective:   Physical Exam Vitals reviewed.  Constitutional:      Appearance: Normal appearance.  Neck:     Vascular: No carotid bruit.  Cardiovascular:     Rate and Rhythm: Normal rate and regular rhythm.     Pulses: Normal pulses.     Heart sounds: Normal heart sounds.  Pulmonary:     Effort: Pulmonary effort is normal.     Breath sounds: Normal breath sounds.  Musculoskeletal:     Right lower leg: No edema.     Left lower leg: No edema.  Neurological:     General: No focal deficit present.     Mental Status: She is  alert and oriented to person, place, and time.  Psychiatric:        Mood and Affect: Mood normal.         Assessment & Plan:  Marland KitchenMarland KitchenZitlali was seen today for follow-up.  Diagnoses and all orders for this visit:  Primary hypertension -     hydrochlorothiazide (HYDRODIURIL) 12.5 MG tablet; Take 1 tablet (12.5 mg total) by mouth daily.  Elevated troponin -     Cardiac Stress Test: Informed Consent Details: Physician/Practitioner Attestation; Transcribe to consent form and obtain patient signature -     Exercise Tolerance Test; Future  Erosive gastritis  Gastroesophageal reflux disease, unspecified whether esophagitis present  Elevated random  blood glucose level  Hypertriglyceridemia -     fenofibrate (TRICOR) 145 MG tablet; Take 1 tablet (145 mg total) by mouth daily.  Dyslipidemia (high LDL; low HDL)  CHEST PAIN, ATYPICAL -     Cardiac Stress Test: Informed Consent Details: Physician/Practitioner Attestation; Transcribe to consent form and obtain patient signature -     Exercise Tolerance Test; Future   Troponin and CKMB did normalize.  Concerned her ischemia could be coming from the smaller coronary arteries.  Cardiology appt in April.  Will get stress test.  Discussed importance of being on statin and BP control.  Tricor for TG. crestor once a week or as often as can take without side effects.  Per pt BP 120-130/80 at home.  Keep BP log.  Stay on HcTZ daily.  CP and elevated BP take ASA 325mg  and get to ED.   Discussed preventing DM diet.  a1c 5.6 at last check less than 3 months ago. Ok to check sugars fasting in am and wants below 120.

## 2020-09-19 NOTE — Telephone Encounter (Signed)
Spoke to patient she is not having any chest pain at present.Stated she has been having chest tightness off and on.She visited a sick friend this past Wed 3/9 and became very upset and sad.She went to [facility] ED with chest pain 3/9.Stated she saw her PCP yesterday and she advised her to see Dr.Jordan sooner than 4/13.Advised Dr.Jordan is out of office this afternoon.His schedule is full.I will speak to him tomorrow 3/18 and call her back.

## 2020-09-22 NOTE — Progress Notes (Deleted)
Cardiology Office Note   Date:  09/22/2020   ID:  Anja, Neuzil 1955/11/05, MRN [id number]  PCP:  Donella Stade, PA-C  Cardiologist:   Peter Martinique, MD   No chief complaint on file.     History of Present Illness: Patty [name] is a 65 y.o. female who is seen at the request of Iran Planas PA-C for evaluation of chest pain. She has a history of HLD and HTN as well as sleep apnea. Also history of GERD and Barrett's esophagus. She does have aortic and iliac atherosclerosis noted on prior CT abdomen.    Past Medical History:  Diagnosis Date  . Anxiety   . Barrett's esophagus   . Cardiomegaly   . Dyspareunia   . Fatty pancreas   . Gastric ulcer   . GERD (gastroesophageal reflux disease)   . Hx of colonic polyps   . Hyperlipidemia   . Hypertension   . Osteopenia   . Osteoporosis   . Shingles   . Sleep apnea   . Thyroid nodule   . Uterine leiomyoma     Past Surgical History:  Procedure Laterality Date  . BIOPSY  07/04/2020   Procedure: BIOPSY;  Surgeon: Rush Landmark Telford Nab., MD;  Location: [facility];  Service: Gastroenterology;;  . BIOPSY THYROID    . COLONOSCOPY W/ POLYPECTOMY    . ESOPHAGOGASTRODUODENOSCOPY (EGD) WITH PROPOFOL N/A 07/04/2020   Procedure: ESOPHAGOGASTRODUODENOSCOPY (EGD) WITH PROPOFOL;  Surgeon: Rush Landmark Telford Nab., MD;  Location: [facility];  Service: Gastroenterology;  Laterality: N/A;  . TUBAL LIGATION    . UPPER ESOPHAGEAL ENDOSCOPIC ULTRASOUND (EUS) N/A 07/04/2020   Procedure: UPPER ESOPHAGEAL ENDOSCOPIC ULTRASOUND (EUS);  Surgeon: Irving Copas., MD;  Location: [facility];  Service: Gastroenterology;  Laterality: N/A;     Current Outpatient Medications  Medication Sig Dispense Refill  . acetaminophen (TYLENOL) 500 MG tablet Take 500-1,000 mg by mouth every 6 (six) hours as needed (for pain.).    Marland Kitchen ALPRAZolam (XANAX) 0.5 MG tablet Take 2 tablets (1 mg total) by mouth at bedtime. 60 tablet 5  . esomeprazole  (NEXIUM) 40 MG capsule Take 1 capsule (40 mg total) by mouth in the morning and at bedtime. 60 capsule 6  . fenofibrate (TRICOR) 145 MG tablet Take 1 tablet (145 mg total) by mouth daily. 90 tablet 3  . hydrochlorothiazide (HYDRODIURIL) 12.5 MG tablet Take 1 tablet (12.5 mg total) by mouth daily. 90 tablet 1  . ibuprofen (ADVIL) 200 MG tablet Take 200-400 mg by mouth every 8 (eight) hours as needed (pain.).    Marland Kitchen rosuvastatin (CRESTOR) 10 MG tablet Take 1 tablet (10 mg total) by mouth daily. 90 tablet 3  . sucralfate (CARAFATE) 1 g tablet TAKE ONE TABLET FOUR TIMES PER DAY. IF UNABLE TO SWALLOW, YOU CAN CRUSH ONE TABLET AND DISSOLVE IN 10ML OF WARM WATER, MIX WELL TO CREATE SLURRY AND DRINK FOUR TIMES DAILY 120 tablet 0   No current facility-administered medications for this visit.    Allergies:   Patient has no known allergies.    Social History:  The patient  reports that she has never smoked. She has never used smokeless tobacco. She reports that she does not drink alcohol and does not use drugs.   Family History:  The patient's ***family history includes Heart disease in her father and mother; Hypertension in her brother and mother.    ROS:  Please see the history of present illness.   Otherwise, review of systems  are positive for {NONE DEFAULTED:18576::"none"}.   All other systems are reviewed and negative.    PHYSICAL EXAM: VS:  LMP 07/06/2008  , BMI There is no height or weight on file to calculate BMI. GEN: Well nourished, well developed, in no acute distress  HEENT: normal  Neck: no JVD, carotid bruits, or masses Cardiac: ***RRR; no murmurs, rubs, or gallops,no edema  Respiratory:  clear to auscultation bilaterally, normal work of breathing GI: soft, nontender, nondistended, + BS MS: no deformity or atrophy  Skin: warm and dry, no rash Neuro:  Strength and sensation are intact Psych: euthymic mood, full affect   EKG:  EKG {ACTION; IS/IS WSF:68127517} ordered today. The ekg  ordered today demonstrates ***   Recent Labs: 03/08/2020: B Natriuretic Peptide 78.1 03/18/2020: Magnesium 2.3 06/14/2020: ALT 47 09/11/2020: BUN 17; Creatinine, Ser 0.73; Hemoglobin 14.3; Platelets 291; Potassium 4.0; Sodium 136    Lipid Panel    Component Value Date/Time   CHOL 217 (H) 06/14/2020 0000   TRIG 216 (H) 06/14/2020 0000   HDL 41 (L) 06/14/2020 0000   CHOLHDL 5.3 (H) 06/14/2020 0000   VLDL 40 05/29/2008 1021   LDLCALC 140 (H) 06/14/2020 0000   LDLDIRECT 77.3 05/29/2008 1021      Wt Readings from Last 3 Encounters:  09/18/20 140 lb (63.5 kg)  07/23/20 140 lb 8 oz (63.7 kg)  07/04/20 140 lb (63.5 kg)      Other studies Reviewed: Additional studies/ records that were reviewed today include: ***. Review of the above records demonstrates: ***   ASSESSMENT AND PLAN:  1.  ***   Current medicines are reviewed at length with the patient today.  The patient {ACTIONS; HAS/DOES NOT HAVE:19233} concerns regarding medicines.  The following changes have been made:  {PLAN; NO CHANGE:13088:s}  Labs/ tests ordered today include: *** No orders of the defined types were placed in this encounter.    Disposition:   FU with *** in {gen number 0-01:749449} {Days to years:10300}  Signed, Peter Martinique, MD  09/22/2020 8:17 AM    [facility] [address] [place], [place], [address] Phone [phone], Fax [phone]

## 2020-09-26 ENCOUNTER — Ambulatory Visit: Payer: Medicare Other | Admitting: Cardiology

## 2020-09-26 ENCOUNTER — Other Ambulatory Visit: Payer: Self-pay

## 2020-09-27 ENCOUNTER — Ambulatory Visit (INDEPENDENT_AMBULATORY_CARE_PROVIDER_SITE_OTHER): Payer: Medicare Other | Admitting: Cardiology

## 2020-09-27 ENCOUNTER — Encounter: Payer: Self-pay | Admitting: Cardiology

## 2020-09-27 VITALS — BP 144/91 | HR 92 | Ht 61.0 in | Wt 138.8 lb

## 2020-09-27 DIAGNOSIS — R079 Chest pain, unspecified: Secondary | ICD-10-CM

## 2020-09-27 DIAGNOSIS — Z13220 Encounter for screening for lipoid disorders: Secondary | ICD-10-CM

## 2020-09-27 DIAGNOSIS — I10 Essential (primary) hypertension: Secondary | ICD-10-CM | POA: Diagnosis not present

## 2020-09-27 MED ORDER — NITROGLYCERIN 0.4 MG SL SUBL: 0.4000 mg | SUBLINGUAL_TABLET | SUBLINGUAL | 3 refills | Status: DC | PRN

## 2020-09-27 MED ORDER — HYDROCHLOROTHIAZIDE 25 MG PO TABS: 25.0000 mg | ORAL_TABLET | Freq: Every day | ORAL | 3 refills | Status: DC

## 2020-09-27 MED ORDER — NITROGLYCERIN 0.4 MG SL SUBL
0.4000 mg | SUBLINGUAL_TABLET | SUBLINGUAL | Status: AC | PRN
  Administered 2020-09-27: 0.4 mg via SUBLINGUAL

## 2020-09-27 NOTE — Patient Instructions (Addendum)
Medication Instructions:  INCREASE hydrochlorothiazide (HCTZ) to 25 mg daily Take sublingual nitroglycerin AS NEEDED for chest pain  *If you need a refill on your cardiac medications before your next appointment, please call your pharmacy*   Lab Work: BMET, Lipid today  If you have labs (blood work) drawn today and your tests are completely normal, you will receive your results only by: Marland Kitchen MyChart Message (if you have MyChart) OR . A paper copy in the mail If you have any lab test that is abnormal or we need to change your treatment, we will call you to review the results.   Testing/Procedures: Your physician has requested that you have an exercise stress myoview. For further information please visit [email or web address]. Please follow instruction sheet, as given. --covid test required 3 days prior  Your physician has requested that you have an echocardiogram. Echocardiography is a painless test that uses sound waves to create images of your heart. It provides your doctor with information about the size and shape of your heart and how well your heart's chambers and valves are working. This procedure takes approximately one hour. There are no restrictions for this procedure. This will be done at our [facility] location:  [address]  CT coronary calcium score. This test is done at [address]. This is $99 out of pocket.   Coronary CalciumScan A coronary calcium scan is an imaging test used to look for deposits of calcium and other fatty materials (plaques) in the inner lining of the blood vessels of the heart (coronary arteries). These deposits of calcium and plaques can partly clog and narrow the coronary arteries without producing any symptoms or warning signs. This puts a person at risk for a heart attack. This test can detect these deposits before symptoms develop. Tell a health care provider about:  Any allergies you have.  All medicines you  are taking, including vitamins, herbs, eye drops, creams, and over-the-counter medicines.  Any problems you or family members have had with anesthetic medicines.  Any blood disorders you have.  Any surgeries you have had.  Any medical conditions you have.  Whether you are pregnant or may be pregnant. What are the risks? Generally, this is a safe procedure. However, problems may occur, including:  Harm to a pregnant woman and her unborn baby. This test involves the use of radiation. Radiation exposure can be dangerous to a pregnant woman and her unborn baby. If you are pregnant, you generally should not have this procedure done.  Slight increase in the risk of cancer. This is because of the radiation involved in the test. What happens before the procedure? No preparation is needed for this procedure. What happens during the procedure?  You will undress and remove any jewelry around your neck or chest.  You will put on a hospital gown.  Sticky electrodes will be placed on your chest. The electrodes will be connected to an electrocardiogram (ECG) machine to record a tracing of the electrical activity of your heart.  A CT scanner will take pictures of your heart. During this time, you will be asked to lie still and hold your breath for 2-3 seconds while a picture of your heart is being taken. The procedure may vary among health care providers and hospitals. What happens after the procedure?  You can get dressed.  You can return to your normal activities.  It is up to you to get the results of your test. Ask  your health care provider, or the department that is doing the test, when your results will be ready. Summary  A coronary calcium scan is an imaging test used to look for deposits of calcium and other fatty materials (plaques) in the inner lining of the blood vessels of the heart (coronary arteries).  Generally, this is a safe procedure. Tell your health care provider if you  are pregnant or may be pregnant.  No preparation is needed for this procedure.  A CT scanner will take pictures of your heart.  You can return to your normal activities after the scan is done. This information is not intended to replace advice given to you by your health care provider. Make sure you discuss any questions you have with your health care provider. Document Released: 12/19/2007 Document Revised: 05/11/2016 Document Reviewed: 05/11/2016 Elsevier Interactive Patient Education  2017 [place]: At [facility], you and your health needs are our priority.  As part of our continuing mission to provide you with exceptional heart care, we have created designated Provider Care Teams.  These Care Teams include your primary Cardiologist (physician) and Advanced Practice Providers (APPs -  Physician Assistants and Nurse Practitioners) who all work together to provide you with the care you need, when you need it.  We recommend signing up for the patient portal called "MyChart".  Sign up information is provided on this After Visit Summary.  MyChart is used to connect with patients for Virtual Visits (Telemedicine).  Patients are able to view lab/test results, encounter notes, upcoming appointments, etc.  Non-urgent messages can be sent to your provider as well.   To learn more about what you can do with MyChart, go to [email or web address].    Your next appointment:   6-8 week(s)  The format for your next appointment:   In Person  Provider:   Oswaldo Milian, MD

## 2020-09-27 NOTE — Progress Notes (Signed)
Cardiology Office Note:    Date:  09/29/2020   ID:  Patty [name], DOB 23-Aug-1955, MRN [id number]  PCP:  Donella Stade, PA-C  Cardiologist:  No primary care provider on file.  Electrophysiologist:  None   Referring MD: Donella Stade, PA-C   Chief Complaint  Patient presents with   Chest Pain    History of Present Illness:    Patty [name] is a 65 y.o. female with a hx of hypertension who is referred by Iran Planas, PA for evaluation of chest pain.  She was seen in the ED on 09/11/2020 with chest pain.  Troponins 3 > 19.  It was recommended that she be admitted but she declined.  She reports she was under a lot of stress at that time, as a friend of hers had just died.  BP was up to 200s.  She does report she has been having chest pain a few times per week.  Describes as left-sided squeezing pain.  Can last for hours when it occurs.  Reports that she is currently having an episode of chest pain.  Occurs when she is stressed.  She does exercise by going for walks, denies any chest pain with walking.  Does report she will get short of breath.  Denies any lightheadedness, syncope, lower extremity edema.  No smoking history.  Family history includes father died of MI at age 75, brother had CVA at 60, another brother had CVA at 33, and another brother had an aortic dissection at age 87.  Past Medical History:  Diagnosis Date   Anxiety    Barrett's esophagus    Cardiomegaly    Dyspareunia    Fatty pancreas    Gastric ulcer    GERD (gastroesophageal reflux disease)    Hx of colonic polyps    Hyperlipidemia    Hypertension    Osteopenia    Osteoporosis    Shingles    Sleep apnea    Thyroid nodule    Uterine leiomyoma     Past Surgical History:  Procedure Laterality Date   BIOPSY  07/04/2020   Procedure: BIOPSY;  Surgeon: Irving Copas., MD;  Location: [facility] ENDOSCOPY;  Service: Gastroenterology;;   BIOPSY THYROID     COLONOSCOPY W/ POLYPECTOMY      ESOPHAGOGASTRODUODENOSCOPY (EGD) WITH PROPOFOL N/A 07/04/2020   Procedure: ESOPHAGOGASTRODUODENOSCOPY (EGD) WITH PROPOFOL;  Surgeon: Irving Copas., MD;  Location: [place];  Service: Gastroenterology;  Laterality: N/A;   TUBAL LIGATION     UPPER ESOPHAGEAL ENDOSCOPIC ULTRASOUND (EUS) N/A 07/04/2020   Procedure: UPPER ESOPHAGEAL ENDOSCOPIC ULTRASOUND (EUS);  Surgeon: Irving Copas., MD;  Location: [place];  Service: Gastroenterology;  Laterality: N/A;    Current Medications: Current Meds  Medication Sig   acetaminophen (TYLENOL) 500 MG tablet Take 500-1,000 mg by mouth every 6 (six) hours as needed (for pain.).   ALPRAZolam (XANAX) 0.5 MG tablet Take 2 tablets (1 mg total) by mouth at bedtime.   esomeprazole (NEXIUM) 40 MG capsule Take 1 capsule (40 mg total) by mouth in the morning and at bedtime.   fenofibrate (TRICOR) 145 MG tablet Take 1 tablet (145 mg total) by mouth daily.   ibuprofen (ADVIL) 200 MG tablet Take 200-400 mg by mouth every 8 (eight) hours as needed (pain.).   nitroGLYCERIN (NITROSTAT) 0.4 MG SL tablet Place 1 tablet (0.4 mg total) under the tongue every 5 (five) minutes as needed.   rosuvastatin (CRESTOR) 10 MG tablet Take 1 tablet (10  mg total) by mouth daily.   sucralfate (CARAFATE) 1 g tablet TAKE ONE TABLET FOUR TIMES PER DAY. IF UNABLE TO SWALLOW, YOU CAN CRUSH ONE TABLET AND DISSOLVE IN 10ML OF WARM WATER, MIX WELL TO CREATE SLURRY AND DRINK FOUR TIMES DAILY   [DISCONTINUED] hydrochlorothiazide (HYDRODIURIL) 12.5 MG tablet Take 1 tablet (12.5 mg total) by mouth daily.   Current Facility-Administered Medications for the 09/27/20 encounter (Office Visit) with Donato Heinz, MD  Medication   nitroGLYCERIN (NITROSTAT) SL tablet 0.4 mg     Allergies:   Patient has no known allergies.   Social History   Socioeconomic History   Marital status: Married    Spouse name: Not on file   Number of children: Not on file    Years of education: Not on file   Highest education level: Not on file  Occupational History   Not on file  Tobacco Use   Smoking status: Never Smoker   Smokeless tobacco: Never Used  Vaping Use   Vaping Use: Never used  Substance and Sexual Activity   Alcohol use: No    Alcohol/week: 0.0 standard drinks   Drug use: No   Sexual activity: Yes    Partners: Male    Birth control/protection: Surgical    Comment: BTL  Other Topics Concern   Not on file  Social History Narrative   Not on file   Social Determinants of Health   Financial Resource Strain: Not on file  Food Insecurity: Not on file  Transportation Needs: Not on file  Physical Activity: Not on file  Stress: Not on file  Social Connections: Not on file     Family History: The patient's family history includes Heart disease in her father and mother; Hypertension in her brother and mother. There is no history of Colon cancer, Esophageal cancer, Pancreatic cancer, Stomach cancer, or Liver disease.  ROS:   Please see the history of present illness.     All other systems reviewed and are negative.  EKGs/Labs/Other Studies Reviewed:    The following studies were reviewed today:   EKG:  EKG is ordered today.  The ekg ordered today demonstrates normal sinus rhythm, rate 92, Q waves in leads III, aVF, poor R wave progression, no ST abnormality  Recent Labs: 03/08/2020: B Natriuretic Peptide 78.1 03/18/2020: Magnesium 2.3 06/14/2020: ALT 47 09/11/2020: Hemoglobin 14.3; Platelets 291 09/27/2020: BUN 16; Creatinine, Ser 0.89; Potassium 4.1; Sodium 142  Recent Lipid Panel    Component Value Date/Time   CHOL 139 09/27/2020 1612   TRIG 167 (H) 09/27/2020 1612   HDL 48 09/27/2020 1612   CHOLHDL 2.9 09/27/2020 1612   CHOLHDL 5.3 (H) 06/14/2020 0000   VLDL 40 05/29/2008 1021   LDLCALC 63 09/27/2020 1612   LDLCALC 140 (H) 06/14/2020 0000   LDLDIRECT 77.3 05/29/2008 1021    Physical Exam:    VS:  BP (!) 144/91     Pulse 92    Ht 5\' 1"  (1.549 m)    Wt 138 lb 12.8 oz (63 kg)    LMP 07/06/2008    SpO2 97%    BMI 26.23 kg/m     Wt Readings from Last 3 Encounters:  09/27/20 138 lb 12.8 oz (63 kg)  09/18/20 140 lb (63.5 kg)  07/23/20 140 lb 8 oz (63.7 kg)     GEN:  Well nourished, well developed in no acute distress HEENT: Normal NECK: No JVD; No carotid bruits LYMPHATICS: No lymphadenopathy CARDIAC: RRR, no murmurs, rubs,  gallops RESPIRATORY:  Clear to auscultation without rales, wheezing or rhonchi  ABDOMEN: Soft, non-tender, non-distended MUSCULOSKELETAL:  No edema; No deformity  SKIN: Warm and dry NEUROLOGIC:  Alert and oriented x 3 PSYCHIATRIC:  Normal affect   ASSESSMENT:    1. Chest pain of uncertain etiology   2. Hypertension, unspecified type   3. Lipid screening    PLAN:    Chest pain: Atypical in description but does occur when she is stressed and she does have CAD risk factors (age, hypertension, family history).  Very mild troponin elevation during recent ED visit (3 > 19), admission was recommended but she declined.  She is currently reporting chest pain in clinic today.  Recommended going to the ED but she declined.  She was given nitroglycerin x1 with resolution of her chest pain. -Lexiscan Myoview to evaluate for ischemia -Calcium score to assess CAD burden -Echocardiogram to evaluate for structural heart disease -Sublingual nitroglycerin as needed.  Advised that if having chest pain lasting longer than 5 minutes, should take nitroglycerin.  If does not resolve after first nitroglycerin, can take second nitroglycerin in 5 minutes.  If chest pain does not resolve after second nitroglycerin, should call EMS.  Hypertension: BP elevated, will increase HCTZ to 25 mg daily.  Check BMP  Lipid screening: Will check lipid panel.  RTC in 6 to 8 weeks  Shared Decision Making/Informed Consent The risks [chest pain, shortness of breath, cardiac arrhythmias, dizziness, blood pressure  fluctuations, myocardial infarction, stroke/transient ischemic attack, nausea, vomiting, allergic reaction, radiation exposure, metallic taste sensation and life-threatening complications (estimated to be 1 in 10,000)], benefits (risk stratification, diagnosing coronary artery disease, treatment guidance) and alternatives of a nuclear stress test were discussed in detail with Ms. Ke and she agrees to proceed.       Medication Adjustments/Labs and Tests Ordered: Current medicines are reviewed at length with the patient today.  Concerns regarding medicines are outlined above.  Orders Placed This Encounter  Procedures   CT CARDIAC SCORING (SELF PAY ONLY)   Basic metabolic panel   Lipid panel   MYOCARDIAL PERFUSION IMAGING   EKG 12-Lead   ECHOCARDIOGRAM COMPLETE   Meds ordered this encounter  Medications   nitroGLYCERIN (NITROSTAT) 0.4 MG SL tablet    Sig: Place 1 tablet (0.4 mg total) under the tongue every 5 (five) minutes as needed.    Dispense:  25 tablet    Refill:  3   hydrochlorothiazide (HYDRODIURIL) 25 MG tablet    Sig: Take 1 tablet (25 mg total) by mouth daily.    Dispense:  90 tablet    Refill:  3    Dose increase   nitroGLYCERIN (NITROSTAT) SL tablet 0.4 mg    Patient Instructions  Medication Instructions:  INCREASE hydrochlorothiazide (HCTZ) to 25 mg daily Take sublingual nitroglycerin AS NEEDED for chest pain  *If you need a refill on your cardiac medications before your next appointment, please call your pharmacy*   Lab Work: BMET, Lipid today  If you have labs (blood work) drawn today and your tests are completely normal, you will receive your results only by:  MyChart Message (if you have MyChart) OR  A paper copy in the mail If you have any lab test that is abnormal or we need to change your treatment, we will call you to review the results.   Testing/Procedures: Your physician has requested that you have an exercise stress myoview. For  further information please visit [email or web address]. Please follow instruction sheet, as given. --  covid test required 3 days prior  Your physician has requested that you have an echocardiogram. Echocardiography is a painless test that uses sound waves to create images of your heart. It provides your doctor with information about the size and shape of your heart and how well your hearts chambers and valves are working. This procedure takes approximately one hour. There are no restrictions for this procedure. This will be done at our [facility] location:  [address]  CT coronary calcium score. This test is done at [address]. This is $99 out of pocket.   Coronary CalciumScan A coronary calcium scan is an imaging test used to look for deposits of calcium and other fatty materials (plaques) in the inner lining of the blood vessels of the heart (coronary arteries). These deposits of calcium and plaques can partly clog and narrow the coronary arteries without producing any symptoms or warning signs. This puts a person at risk for a heart attack. This test can detect these deposits before symptoms develop. Tell a health care provider about:  Any allergies you have.  All medicines you are taking, including vitamins, herbs, eye drops, creams, and over-the-counter medicines.  Any problems you or family members have had with anesthetic medicines.  Any blood disorders you have.  Any surgeries you have had.  Any medical conditions you have.  Whether you are pregnant or may be pregnant. What are the risks? Generally, this is a safe procedure. However, problems may occur, including:  Harm to a pregnant woman and her unborn baby. This test involves the use of radiation. Radiation exposure can be dangerous to a pregnant woman and her unborn baby. If you are pregnant, you generally should not have this procedure done.  Slight increase in the risk of cancer.  This is because of the radiation involved in the test. What happens before the procedure? No preparation is needed for this procedure. What happens during the procedure?  You will undress and remove any jewelry around your neck or chest.  You will put on a hospital gown.  Sticky electrodes will be placed on your chest. The electrodes will be connected to an electrocardiogram (ECG) machine to record a tracing of the electrical activity of your heart.  A CT scanner will take pictures of your heart. During this time, you will be asked to lie still and hold your breath for 2-3 seconds while a picture of your heart is being taken. The procedure may vary among health care providers and hospitals. What happens after the procedure?  You can get dressed.  You can return to your normal activities.  It is up to you to get the results of your test. Ask your health care provider, or the department that is doing the test, when your results will be ready. Summary  A coronary calcium scan is an imaging test used to look for deposits of calcium and other fatty materials (plaques) in the inner lining of the blood vessels of the heart (coronary arteries).  Generally, this is a safe procedure. Tell your health care provider if you are pregnant or may be pregnant.  No preparation is needed for this procedure.  A CT scanner will take pictures of your heart.  You can return to your normal activities after the scan is done. This information is not intended to replace advice given to you by your health care provider. Make sure you discuss any questions you have with your  health care provider. Document Released: 12/19/2007 Document Revised: 05/11/2016 Document Reviewed: 05/11/2016 Elsevier Interactive Patient Education  2017 [place]: At [facility], you and your health needs are our priority.  As part of our continuing mission to provide you with exceptional heart care, we have created  designated Provider Care Teams.  These Care Teams include your primary Cardiologist (physician) and Advanced Practice Providers (APPs -  Physician Assistants and Nurse Practitioners) who all work together to provide you with the care you need, when you need it.  We recommend signing up for the patient portal called "MyChart".  Sign up information is provided on this After Visit Summary.  MyChart is used to connect with patients for Virtual Visits (Telemedicine).  Patients are able to view lab/test results, encounter notes, upcoming appointments, etc.  Non-urgent messages can be sent to your provider as well.   To learn more about what you can do with MyChart, go to [email or web address].    Your next appointment:   6-8 week(s)  The format for your next appointment:   In Person  Provider:   Oswaldo Milian, MD        Signed, Donato Heinz, MD  09/29/2020 2:26 PM    [place]

## 2020-09-28 LAB — BASIC METABOLIC PANEL
BUN/Creatinine Ratio: 18 (ref 12–28)
BUN: 16 mg/dL (ref 8–27)
CO2: 23 mmol/L (ref 20–29)
Calcium: 10.4 mg/dL — ABNORMAL HIGH (ref 8.7–10.3)
Chloride: 101 mmol/L (ref 96–106)
Creatinine, Ser: 0.89 mg/dL (ref 0.57–1.00)
Glucose: 120 mg/dL — ABNORMAL HIGH (ref 65–99)
Potassium: 4.1 mmol/L (ref 3.5–5.2)
Sodium: 142 mmol/L (ref 134–144)
eGFR: 72 mL/min/{1.73_m2} (ref >59)

## 2020-09-28 LAB — LIPID PANEL
Chol/HDL Ratio: 2.9 ratio (ref 0.0–4.4)
Cholesterol, Total: 139 mg/dL (ref 100–199)
HDL: 48 mg/dL (ref >39)
LDL Chol Calc (NIH): 63 mg/dL (ref 0–99)
Triglycerides: 167 mg/dL — ABNORMAL HIGH (ref 0–149)
VLDL Cholesterol Cal: 28 mg/dL (ref 5–40)

## 2020-09-30 ENCOUNTER — Telehealth: Payer: Self-pay | Admitting: Cardiology

## 2020-09-30 ENCOUNTER — Encounter: Payer: Self-pay | Admitting: Cardiology

## 2020-09-30 NOTE — Telephone Encounter (Signed)
3.28.22 LVM on cell phone to schedule EX Stress Myoview, echo, and CT score. LP

## 2020-10-05 ENCOUNTER — Other Ambulatory Visit: Payer: Self-pay | Admitting: Gastroenterology

## 2020-10-10 ENCOUNTER — Other Ambulatory Visit: Payer: Self-pay

## 2020-10-10 ENCOUNTER — Ambulatory Visit (HOSPITAL_COMMUNITY): Payer: Medicare Other | Attending: Cardiovascular Disease

## 2020-10-10 ENCOUNTER — Ambulatory Visit (INDEPENDENT_AMBULATORY_CARE_PROVIDER_SITE_OTHER)
Admission: RE | Admit: 2020-10-10 | Discharge: 2020-10-10 | Disposition: A | Discharge status: Home / Self Care | Payer: Self-pay | Source: Ambulatory Visit | Attending: Cardiology | Admitting: Cardiology

## 2020-10-10 DIAGNOSIS — R079 Chest pain, unspecified: Secondary | ICD-10-CM | POA: Insufficient documentation

## 2020-10-10 LAB — ECHOCARDIOGRAM COMPLETE
Area-P 1/2: 2.73 cm2
S' Lateral: 2.5 cm

## 2020-10-11 ENCOUNTER — Encounter: Payer: Self-pay | Admitting: Gastroenterology

## 2020-10-11 ENCOUNTER — Ambulatory Visit: Payer: Medicare Other | Admitting: Gastroenterology

## 2020-10-11 VITALS — BP 112/80 | HR 83 | Ht 60.0 in | Wt 139.4 lb

## 2020-10-11 DIAGNOSIS — R1013 Epigastric pain: Secondary | ICD-10-CM | POA: Diagnosis not present

## 2020-10-11 DIAGNOSIS — K259 Gastric ulcer, unspecified as acute or chronic, without hemorrhage or perforation: Secondary | ICD-10-CM

## 2020-10-11 NOTE — Patient Instructions (Addendum)
You have been scheduled for an endoscopy. Please follow written instructions given to you at your visit today. If you use inhalers (even only as needed), please bring them with you on the day of your procedure.   If you are age 66 or older, your body mass index should be between 23-30. Your Body mass index is 27.22 kg/m. If this is out of the aforementioned range listed, please consider follow up with your Primary Care Provider.  Restart Nexiun 20mg  over the counter once daily.   Ok to use Carafate 2-4 times daily as needed.    Due to recent changes in healthcare laws, you may see the results of your imaging and laboratory studies on MyChart before your provider has had a chance to review them.  We understand that in some cases there may be results that are confusing or concerning to you. Not all laboratory results come back in the same time frame and the provider may be waiting for multiple results in order to interpret others.  Please give [place] 48 hours in order for your provider to thoroughly review all the results before contacting the office for clarification of your results.   Thank you for choosing me and [facility].  Dr. Rush Landmark

## 2020-10-14 ENCOUNTER — Encounter: Payer: Self-pay | Admitting: Gastroenterology

## 2020-10-14 DIAGNOSIS — K259 Gastric ulcer, unspecified as acute or chronic, without hemorrhage or perforation: Secondary | ICD-10-CM | POA: Insufficient documentation

## 2020-10-14 NOTE — Progress Notes (Addendum)
[place] VISIT   Primary Care Provider Lavada Mesi [address] [place] [address] [phone]  Patient Profile: Patty [name] is a 65 y.o. female with a pmh significant for hypertension, hyperlipidemia, osteopenia/osteoporosis, sleep apnea, anxiety, GERD, peptic ulcer disease (manifested as GU).  The patient presents to the [facility] for an evaluation and management of problem(s) noted below:  Problem List 1. Gastric ulcer without hemorrhage or perforation, unspecified chronicity   2. Abdominal pain, epigastric     History of Present Illness Please see initial consultation note by PA [facility] for full details of HPI.  Interval History The patient returns for scheduled follow-up.  I last saw the patient for her EGD/EUS.  Pancreas grossly unremarkable.  However she was found to have peptic ulcer disease that was negative for H. pylori on biopsies.  No evidence of Barrett's esophagus was found either.  Patient has been taking PPI therapy as well as Carafate.  She had been doing relatively well.  However, unfortunately, her brother is in the process of actively dying/passing away in [place].  This has been significantly difficult for her and her family.  She has had some more abdominal discomfort as a result of her recent issues.  This most often occurs when she is hungry.  Patient denies any coffee-ground emesis or hematemesis.  She denies any changes in bowel habits or melena or hematochezia.  GI Review of Systems Positive as above Negative for pyrosis, odynophagia, dysphagia, early satiety  Review of Systems General: Denies fevers/chills/weight loss unintentionally Cardiovascular: Denies chest pain Pulmonary: Denies shortness of breath Gastroenterological: See HPI Genitourinary: Denies darkened urine Hematological: Denies easy bruising/bleeding Endocrine: Denies temperature intolerance Dermatological:  Denies jaundice Psychological: Mood is anxious as well as down/depressed due to family situation though she believes in God and that he has a plan for her brother   Medications Current Outpatient Medications  Medication Sig Dispense Refill  . acetaminophen (TYLENOL) 500 MG tablet Take 500-1,000 mg by mouth every 6 (six) hours as needed (for pain.).    Marland Kitchen ALPRAZolam (XANAX) 0.5 MG tablet Take 2 tablets (1 mg total) by mouth at bedtime. 60 tablet 5  . esomeprazole (NEXIUM) 40 MG capsule Take 1 capsule (40 mg total) by mouth in the morning and at bedtime. 60 capsule 6  . fenofibrate (TRICOR) 145 MG tablet Take 1 tablet (145 mg total) by mouth daily. 90 tablet 3  . hydrochlorothiazide (HYDRODIURIL) 25 MG tablet Take 1 tablet (25 mg total) by mouth daily. 90 tablet 3  . ibuprofen (ADVIL) 200 MG tablet Take 200-400 mg by mouth every 8 (eight) hours as needed (pain.).    Marland Kitchen rosuvastatin (CRESTOR) 10 MG tablet Take 1 tablet (10 mg total) by mouth daily. 90 tablet 3  . sucralfate (CARAFATE) 1 g tablet TAKE ONE TABLET FOUR TIMES PER DAY. IF UNABLE TO SWALLOW, YOU CAN CRUSH ONE TABLET AND DISSOLVE IN 10ML OF WARM WATER, MIX WELL TO CREATE SLURRY AND DRINK FOUR TIMES DAILY 120 tablet 0  . nitroGLYCERIN (NITROSTAT) 0.4 MG SL tablet Place 1 tablet (0.4 mg total) under the tongue every 5 (five) minutes as needed. (Patient not taking: Reported on 10/11/2020) 25 tablet 3   Current Facility-Administered Medications  Medication Dose Route Frequency Provider Last Rate Last Admin  . nitroGLYCERIN (NITROSTAT) SL tablet 0.4 mg  0.4 mg Sublingual Q5 min PRN Donato Heinz, MD   0.4 mg at 09/27/20 1550  Allergies No Known Allergies  Histories Past Medical History:  Diagnosis Date  . Anxiety   . Barrett's esophagus   . Cardiomegaly   . Dyspareunia   . Fatty pancreas   . Gastric ulcer   . GERD (gastroesophageal reflux disease)   . Hx of colonic polyps   . Hyperlipidemia   . Hypertension   .  Osteopenia   . Osteoporosis   . Shingles   . Sleep apnea   . Thyroid nodule   . Uterine leiomyoma    Past Surgical History:  Procedure Laterality Date  . BIOPSY  07/04/2020   Procedure: BIOPSY;  Surgeon: Rush Landmark Telford Nab., MD;  Location: [facility];  Service: Gastroenterology;;  . BIOPSY THYROID    . COLONOSCOPY W/ POLYPECTOMY    . ESOPHAGOGASTRODUODENOSCOPY (EGD) WITH PROPOFOL N/A 07/04/2020   Procedure: ESOPHAGOGASTRODUODENOSCOPY (EGD) WITH PROPOFOL;  Surgeon: Rush Landmark Telford Nab., MD;  Location: [facility];  Service: Gastroenterology;  Laterality: N/A;  . TUBAL LIGATION    . UPPER ESOPHAGEAL ENDOSCOPIC ULTRASOUND (EUS) N/A 07/04/2020   Procedure: UPPER ESOPHAGEAL ENDOSCOPIC ULTRASOUND (EUS);  Surgeon: Irving Copas., MD;  Location: [facility];  Service: Gastroenterology;  Laterality: N/A;   Social History   Socioeconomic History  . Marital status: Married    Spouse name: Not on file  . Number of children: Not on file  . Years of education: Not on file  . Highest education level: Not on file  Occupational History  . Not on file  Tobacco Use  . Smoking status: Never Smoker  . Smokeless tobacco: Never Used  Vaping Use  . Vaping Use: Never used  Substance and Sexual Activity  . Alcohol use: No    Alcohol/week: 0.0 standard drinks  . Drug use: No  . Sexual activity: Yes    Partners: Male    Birth control/protection: Surgical    Comment: BTL  Other Topics Concern  . Not on file  Social History Narrative  . Not on file   Social Determinants of Health   Financial Resource Strain: Not on file  Food Insecurity: Not on file  Transportation Needs: Not on file  Physical Activity: Not on file  Stress: Not on file  Social Connections: Not on file  Intimate Partner Violence: Not on file   Family History  Problem Relation Age of Onset  . Hypertension Mother   . Heart disease Mother   . Heart disease Father   . Hypertension Brother   . Colon  cancer Neg Hx   . Esophageal cancer Neg Hx   . Pancreatic cancer Neg Hx   . Stomach cancer Neg Hx   . Liver disease Neg Hx   . Inflammatory bowel disease Neg Hx   . Rectal cancer Neg Hx    I have reviewed her medical, social, and family history in detail and updated the electronic medical record as necessary.    PHYSICAL EXAMINATION  BP 112/80 (BP Location: Left Arm, Patient Position: Sitting, Cuff Size: Normal)   Pulse 83   Ht 5' (1.524 m) Comment: height measured without shoes  Wt 139 lb 6 oz (63.2 kg)   LMP 07/06/2008   BMI 27.22 kg/m  Wt Readings from Last 3 Encounters:  10/11/20 139 lb 6 oz (63.2 kg)  09/27/20 138 lb 12.8 oz (63 kg)  09/18/20 140 lb (63.5 kg)  GEN: NAD, appears stated age, doesn't appear chronically ill PSYCH: Cooperative, without pressured speech EYE: Conjunctivae pink, sclerae anicteric ENT: Masked CV: Nontachycardic RESP: No  audible wheezing GI: NABS, soft, NT/ND, without rebound or guarding, no HSM appreciated MSK/EXT: No lower extremity edema SKIN: No jaundice NEURO:  Alert & Oriented x 3, no focal deficits   REVIEW OF DATA  I reviewed the following data at the time of this encounter:  GI Procedures and Studies  December 2021 EGD/EUS EGD Impression: - No gross lesions in esophagus. After completion of EUS, EGD scope reinserted and noted a mucosal wrent just below UES from scope passage but no perforation. - Z-line regular, 39 cm from the incisors. - 1 cm hiatal hernia. - Gastritis with hemorrhage. Non-bleeding gastric ulcers with a clean ulcer base (Forrest Class III). No other gross lesions in the stomach. Biopsied. - No gross lesions in the duodenal bulb, in the first portion of the duodenum and in the second portion of the duodenum. Biopsied. - Normal ampulla. EUS Impression: - Pancreatic parenchymal abnormalities consisting of diffuse echogenicity were noted in the entire pancreas. The pancreatic duct had a normal endosonographic  appearance in the pancreatic head, genu of the pancreas, body of the pancreas and tail of the pancreas. No mass or lesion noted in pancreas. - There was no sign of significant pathology in the common bile duct. - Hyperechoic material consistent with sludge was visualized endosonographically in the gallbladder. - Normal ampulla. - No malignant-appearing lymph nodes were visualized in the celiac region (level 20), peripancreatic region and porta hepatis region.  Pathology FINAL MICROSCOPIC DIAGNOSIS:  A. DUODENUM, BIOPSY:  - Duodenal mucosa with no significant pathologic findings.  - Negative for increased intraepithelial lymphocytes and villous  architectural changes.  B. STOMACH, BIOPSY:  - Erosive gastritis.  - Warthin-Starry stain is negative for Helicobacter pylori.   Laboratory Studies  Reviewed those in epic  Imaging Studies  October 2021 MRI abdomen IMPRESSION: 1. No pancreatic mass or other significant abnormality is identified. 2. Mild bibasilar opacities in the lungs possibly from atypical pneumonia. 3. Despite efforts by the technologist and patient, motion artifact is present on today's exam and could not be eliminated. This reduces exam sensitivity and specificity.   ASSESSMENT  Ms. Giannetti is a 65 y.o. female with a pmh significant for hypertension, hyperlipidemia, osteopenia/osteoporosis, sleep apnea, anxiety, GERD, peptic ulcer disease (manifested as GU).  The patient is seen today for evaluation and management of:  1. Gastric ulcer without hemorrhage or perforation, unspecified chronicity   2. Abdominal pain, epigastric    The patient is hemodynamically stable.  Clinically she seems to have been doing relatively well until most recent stress related issues have occurred because of her brothers health.  She will continue PPI therapy and Carafate for now.  She may increase Carafate for a few days if necessary or increase her Nexium to twice daily.  She will be  scheduled in the coming months for repeat upper endoscopy to ensure healing of gastric ulcer.  This can be done in the [facility] rather than the hospital.  We will place a colonoscopy recall for 2029 for 10-year follow-up based on her notation of prior normal colonoscopy in 2019.  If patient's issues persist post follow-up endoscopy with healing of gastric ulcers and additional work-up and evaluation will be considered.  All patient questions were answered to the best of my ability, and the patient agrees to the aforementioned plan of action with follow-up as indicated.   PLAN  Proceed with scheduling upper endoscopy for surveillance of prior gastric ulcer Continue Nexium Continue Carafate Colon cancer screening via colonoscopy in 2029 recall  Orders Placed This Encounter  Procedures  . Ambulatory referral to Gastroenterology    New Prescriptions   No medications on file   Modified Medications   No medications on file    Planned Follow Up No follow-ups on file.   Total Time in Face-to-Face and in Coordination of Care for patient including independent/personal interpretation/review of prior testing, medical history, examination, medication adjustment, communicating results with the patient directly, and documentation with the EHR is 25 minutes.   Justice Britain, MD [facility]oscopy Office # [phone]

## 2020-10-15 ENCOUNTER — Encounter: Payer: Self-pay | Admitting: Gastroenterology

## 2020-10-16 ENCOUNTER — Ambulatory Visit: Payer: Medicare Other | Admitting: Cardiology

## 2020-10-24 DIAGNOSIS — H30893 Other chorioretinal inflammations, bilateral: Secondary | ICD-10-CM | POA: Diagnosis not present

## 2020-10-31 NOTE — Telephone Encounter (Signed)
We can cancel stress test

## 2020-11-01 ENCOUNTER — Other Ambulatory Visit: Payer: Self-pay | Admitting: Neurology

## 2020-11-01 MED ORDER — ROSUVASTATIN CALCIUM 10 MG PO TABS: 10.0000 mg | ORAL_TABLET | Freq: Every day | ORAL | 2 refills | Status: DC

## 2020-11-04 ENCOUNTER — Telehealth (HOSPITAL_COMMUNITY): Payer: Self-pay | Admitting: Cardiology

## 2020-11-04 ENCOUNTER — Other Ambulatory Visit (HOSPITAL_COMMUNITY): Payer: Medicare Other

## 2020-11-04 NOTE — Telephone Encounter (Signed)
Patient called and cancelled Myoview for reason below:  Canceled: 11/01/2020 9:05 AM IH:WTUUEK, HAYLEY A Cancel Rsn: Patient  Order will be removed from the [facility] and if pt calls back to reschedule we will reinstate the order. Thank you

## 2020-11-07 ENCOUNTER — Ambulatory Visit (HOSPITAL_COMMUNITY): Admission: RE | Admit: 2020-11-07 | Payer: Medicare Other | Source: Ambulatory Visit

## 2020-11-11 ENCOUNTER — Ambulatory Visit: Payer: Medicare Other | Admitting: Cardiology

## 2020-11-14 ENCOUNTER — Encounter: Payer: Self-pay | Admitting: Physician Assistant

## 2020-11-20 ENCOUNTER — Encounter: Payer: Self-pay | Admitting: Physician Assistant

## 2020-11-22 ENCOUNTER — Ambulatory Visit: Payer: Medicare Other | Admitting: Cardiology

## 2020-11-22 ENCOUNTER — Other Ambulatory Visit: Payer: Self-pay

## 2020-11-22 ENCOUNTER — Encounter: Payer: Self-pay | Admitting: Cardiology

## 2020-11-22 VITALS — BP 138/84 | HR 93 | Ht 64.0 in | Wt 142.0 lb

## 2020-11-22 DIAGNOSIS — E785 Hyperlipidemia, unspecified: Secondary | ICD-10-CM

## 2020-11-22 DIAGNOSIS — R079 Chest pain, unspecified: Secondary | ICD-10-CM | POA: Diagnosis not present

## 2020-11-22 DIAGNOSIS — I10 Essential (primary) hypertension: Secondary | ICD-10-CM

## 2020-11-22 DIAGNOSIS — E781 Pure hyperglyceridemia: Secondary | ICD-10-CM

## 2020-11-22 NOTE — Progress Notes (Signed)
Cardiology Office Note:    Date:  11/24/2020   ID:  Patty [name], DOB Apr 10, 1956, MRN [id number]  PCP:  Donella Stade, PA-C  Cardiologist:  None  Electrophysiologist:  None   Referring MD: Donella Stade, PA-C   Chief Complaint  Patient presents with  . Chest Pain    History of Present Illness:    Patty [name] is a 65 y.o. female with a hx of hypertension who presents for follow-up.  She was referred by Iran Planas, PA for evaluation of chest pain, initially seen on 09/27/2020.  She was seen in the ED on 09/11/2020 with chest pain.  Troponins 3 > 19.  It was recommended that she be admitted but she declined.  She reports she was under a lot of stress at that time, as a friend of hers had just died.  BP was up to 200s.  She does report she has been having chest pain a few times per week.  Describes as left-sided squeezing pain.  Can last for hours when it occurs.  Reports that she is currently having an episode of chest pain.  Occurs when she is stressed.  She does exercise by going for walks, denies any chest pain with walking.  Does report she will get short of breath.  Denies any lightheadedness, syncope, lower extremity edema.  No smoking history.  Family history includes father died of MI at age 83, brother had CVA at 30, another brother had CVA at 64, and another brother had an aortic dissection at age 67.  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  Echocardiogram on 10/10/2020 showed normal biventricular function, and no significant valvular disease.  Lexiscan Myoview was ordered but has not been done.  Since last clinic visit, she reports that she is doing well.  Denies any chest pain, dyspnea, syncope, lower extremity edema, or palpitations.  Does report intermittent lightheadedness.  She has been walking daily for about 20 minutes.  Denies exertional symptoms.    Past Medical History:  Diagnosis Date  . Anxiety   . Barrett's  esophagus   . Cardiomegaly   . Dyspareunia   . Fatty pancreas   . Gastric ulcer   . GERD (gastroesophageal reflux disease)   . Hx of colonic polyps   . Hyperlipidemia   . Hypertension   . Osteopenia   . Osteoporosis   . Shingles   . Sleep apnea   . Thyroid nodule   . Uterine leiomyoma     Past Surgical History:  Procedure Laterality Date  . BIOPSY  07/04/2020   Procedure: BIOPSY;  Surgeon: Rush Landmark Telford Nab., MD;  Location: [facility];  Service: Gastroenterology;;  . BIOPSY THYROID    . COLONOSCOPY W/ POLYPECTOMY    . ESOPHAGOGASTRODUODENOSCOPY (EGD) WITH PROPOFOL N/A 07/04/2020   Procedure: ESOPHAGOGASTRODUODENOSCOPY (EGD) WITH PROPOFOL;  Surgeon: Rush Landmark Telford Nab., MD;  Location: [facility];  Service: Gastroenterology;  Laterality: N/A;  . TUBAL LIGATION    . UPPER ESOPHAGEAL ENDOSCOPIC ULTRASOUND (EUS) N/A 07/04/2020   Procedure: UPPER ESOPHAGEAL ENDOSCOPIC ULTRASOUND (EUS);  Surgeon: Irving Copas., MD;  Location: [facility];  Service: Gastroenterology;  Laterality: N/A;    Current Medications: Current Meds  Medication Sig  . acetaminophen (TYLENOL) 500 MG tablet Take 500-1,000 mg by mouth every 6 (six) hours as needed (for pain.).  Marland Kitchen ALPRAZolam (XANAX) 0.5 MG tablet Take 2 tablets (1 mg total) by mouth at bedtime.  Marland Kitchen esomeprazole (  NEXIUM) 40 MG capsule Take 1 capsule (40 mg total) by mouth in the morning and at bedtime.  . fenofibrate (TRICOR) 145 MG tablet Take 1 tablet (145 mg total) by mouth daily.  . hydrochlorothiazide (HYDRODIURIL) 25 MG tablet Take 1 tablet (25 mg total) by mouth daily.  Marland Kitchen ibuprofen (ADVIL) 200 MG tablet Take 200-400 mg by mouth every 8 (eight) hours as needed (pain.).  Marland Kitchen nitroGLYCERIN (NITROSTAT) 0.4 MG SL tablet Place 1 tablet (0.4 mg total) under the tongue every 5 (five) minutes as needed.  . rosuvastatin (CRESTOR) 10 MG tablet Take 1 tablet (10 mg total) by mouth daily.  . sucralfate (CARAFATE) 1 g tablet TAKE ONE  TABLET FOUR TIMES PER DAY. IF UNABLE TO SWALLOW, YOU CAN CRUSH ONE TABLET AND DISSOLVE IN 10ML OF WARM WATER, MIX WELL TO CREATE SLURRY AND DRINK FOUR TIMES DAILY   Current Facility-Administered Medications for the 11/22/20 encounter (Office Visit) with Donato Heinz, MD  Medication  . nitroGLYCERIN (NITROSTAT) SL tablet 0.4 mg     Allergies:   Patient has no known allergies.   Social History   Socioeconomic History  . Marital status: Married    Spouse name: Not on file  . Number of children: Not on file  . Years of education: Not on file  . Highest education level: Not on file  Occupational History  . Not on file  Tobacco Use  . Smoking status: Never Smoker  . Smokeless tobacco: Never Used  Vaping Use  . Vaping Use: Never used  Substance and Sexual Activity  . Alcohol use: No    Alcohol/week: 0.0 standard drinks  . Drug use: No  . Sexual activity: Yes    Partners: Male    Birth control/protection: Surgical    Comment: BTL  Other Topics Concern  . Not on file  Social History Narrative  . Not on file   Social Determinants of Health   Financial Resource Strain: Not on file  Food Insecurity: Not on file  Transportation Needs: Not on file  Physical Activity: Not on file  Stress: Not on file  Social Connections: Not on file     Family History: The patient's family history includes Heart disease in her father and mother; Hypertension in her brother and mother. There is no history of Colon cancer, Esophageal cancer, Pancreatic cancer, Stomach cancer, Liver disease, Inflammatory bowel disease, or Rectal cancer.  ROS:   Please see the history of present illness.     All other systems reviewed and are negative.  EKGs/Labs/Other Studies Reviewed:    The following studies were reviewed today:   EKG:  EKG is ordered today.  The ekg ordered today demonstrates normal sinus rhythm, rate 92, Q waves in leads III, aVF, poor R wave progression, no ST  abnormality  Recent Labs: 03/08/2020: B Natriuretic Peptide 78.1 03/18/2020: Magnesium 2.3 06/14/2020: ALT 47 09/11/2020: Hemoglobin 14.3; Platelets 291 09/27/2020: BUN 16; Creatinine, Ser 0.89; Potassium 4.1; Sodium 142  Recent Lipid Panel    Component Value Date/Time   CHOL 139 09/27/2020 1612   TRIG 167 (H) 09/27/2020 1612   HDL 48 09/27/2020 1612   CHOLHDL 2.9 09/27/2020 1612   CHOLHDL 5.3 (H) 06/14/2020 0000   VLDL 40 05/29/2008 1021   LDLCALC 63 09/27/2020 1612   LDLCALC 140 (H) 06/14/2020 0000   LDLDIRECT 77.3 05/29/2008 1021    Physical Exam:    VS:  BP 138/84   Pulse 93   Ht 5\' 4"  (1.626 m)  Wt 142 lb (64.4 kg)   LMP 07/06/2008   SpO2 97%   BMI 24.37 kg/m     Wt Readings from Last 3 Encounters:  11/22/20 142 lb (64.4 kg)  10/11/20 139 lb 6 oz (63.2 kg)  09/27/20 138 lb 12.8 oz (63 kg)     GEN:  Well nourished, well developed in no acute distress HEENT: Normal NECK: No JVD; No carotid bruits LYMPHATICS: No lymphadenopathy CARDIAC: RRR, no murmurs, rubs, gallops RESPIRATORY:  Clear to auscultation without rales, wheezing or rhonchi  ABDOMEN: Soft, non-tender, non-distended MUSCULOSKELETAL:  No edema; No deformity  SKIN: Warm and dry NEUROLOGIC:  Alert and oriented x 3 PSYCHIATRIC:  Normal affect   ASSESSMENT:    1. Chest pain of uncertain etiology   2. Hyperlipidemia, unspecified hyperlipidemia type   3. Hypertension, unspecified type    PLAN:    Chest pain: Atypical in description but does occur when she is stressed and she does have CAD risk factors (age, hypertension, family history).  Very mild troponin elevation during recent ED visit (3 > 19), admission was recommended but she declined.  Calcium score on 10/10/2020 was 1 (53rd percentile).  Echocardiogram on 10/10/2020 showed normal biventricular function, and no significant valvular disease. -Exercise Myoview ordered to evaluate for ischemia  Hypertension: Continue hydrochlorothiazide 25 mg  daily  Hyperlipidemia: LDL 140 on 06/14/2020.  Started on rosuvastatin 10 mg daily, LDL 63 on 09/27/2020  RTC in 3 months  Shared Decision Making/Informed Consent The risks [chest pain, shortness of breath, cardiac arrhythmias, dizziness, blood pressure fluctuations, myocardial infarction, stroke/transient ischemic attack, nausea, vomiting, allergic reaction, radiation exposure, metallic taste sensation and life-threatening complications (estimated to be 1 in 10,000)], benefits (risk stratification, diagnosing coronary artery disease, treatment guidance) and alternatives of a nuclear stress test were discussed in detail with Ms. Eldredge and she agrees to proceed.         Medication Adjustments/Labs and Tests Ordered: Current medicines are reviewed at length with the patient today.  Concerns regarding medicines are outlined above.  Orders Placed This Encounter  Procedures  . Lipid panel  . MYOCARDIAL PERFUSION IMAGING   No orders of the defined types were placed in this encounter.   Patient Instructions  Medication Instructions:  Your physician recommends that you continue on your current medications as directed. Please refer to the Current Medication list given to you today.  *If you need a refill on your cardiac medications before your next appointment, please call your pharmacy*   Lab Work: Please return for FASTING labs in 1-2 months (Lipid)  Our in office lab hours are Monday-Friday 8:00-4:00, closed for lunch 12:45-1:45 pm.  No appointment needed.  Testing/Procedures: Your physician has requested that you have an exercise stress myoview. For further information please visit [email or web address]. Please follow instruction sheet, as given.  Follow-Up: At [facility], you and your health needs are our priority.  As part of our continuing mission to provide you with exceptional heart care, we have created designated Provider Care Teams.  These Care Teams include your primary  Cardiologist (physician) and Advanced Practice Providers (APPs -  Physician Assistants and Nurse Practitioners) who all work together to provide you with the care you need, when you need it.  We recommend signing up for the patient portal called "MyChart".  Sign up information is provided on this After Visit Summary.  MyChart is used to connect with patients for Virtual Visits (Telemedicine).  Patients are able to view lab/test results, encounter notes, upcoming  appointments, etc.  Non-urgent messages can be sent to your provider as well.   To learn more about what you can do with MyChart, go to [email or web address].    Your next appointment:   3 month(s)  The format for your next appointment:   In Person  Provider:   Oswaldo Milian, MD        Signed, Donato Heinz, MD  11/24/2020 4:33 PM    [place]

## 2020-11-22 NOTE — Patient Instructions (Signed)
Medication Instructions:  Your physician recommends that you continue on your current medications as directed. Please refer to the Current Medication list given to you today.  *If you need a refill on your cardiac medications before your next appointment, please call your pharmacy*   Lab Work: Please return for FASTING labs in 1-2 months (Lipid)  Our in office lab hours are Monday-Friday 8:00-4:00, closed for lunch 12:45-1:45 pm.  No appointment needed.  Testing/Procedures: Your physician has requested that you have an exercise stress myoview. For further information please visit [email or web address]. Please follow instruction sheet, as given.  Follow-Up: At [facility], you and your health needs are our priority.  As part of our continuing mission to provide you with exceptional heart care, we have created designated Provider Care Teams.  These Care Teams include your primary Cardiologist (physician) and Advanced Practice Providers (APPs -  Physician Assistants and Nurse Practitioners) who all work together to provide you with the care you need, when you need it.  We recommend signing up for the patient portal called "MyChart".  Sign up information is provided on this After Visit Summary.  MyChart is used to connect with patients for Virtual Visits (Telemedicine).  Patients are able to view lab/test results, encounter notes, upcoming appointments, etc.  Non-urgent messages can be sent to your provider as well.   To learn more about what you can do with MyChart, go to [email or web address].    Your next appointment:   3 month(s)  The format for your next appointment:   In Person  Provider:   Oswaldo Milian, MD

## 2020-11-26 ENCOUNTER — Other Ambulatory Visit: Payer: Self-pay | Admitting: Neurology

## 2020-11-26 DIAGNOSIS — E781 Pure hyperglyceridemia: Secondary | ICD-10-CM

## 2020-11-26 MED ORDER — HYDROCHLOROTHIAZIDE 25 MG PO TABS: 25.0000 mg | ORAL_TABLET | Freq: Every day | ORAL | 2 refills | Status: DC

## 2020-11-26 MED ORDER — FENOFIBRATE 145 MG PO TABS: 145.0000 mg | ORAL_TABLET | Freq: Every day | ORAL | 2 refills | Status: DC

## 2020-11-28 ENCOUNTER — Telehealth: Payer: Self-pay | Admitting: *Deleted

## 2020-11-28 NOTE — Telephone Encounter (Signed)
A message was left, re: rescheduling her stress test from 12/03/20.

## 2020-12-03 ENCOUNTER — Inpatient Hospital Stay (HOSPITAL_COMMUNITY): Admission: RE | Admit: 2020-12-03 | Payer: Medicare Other | Source: Ambulatory Visit

## 2020-12-04 ENCOUNTER — Other Ambulatory Visit: Payer: Self-pay | Admitting: Neurology

## 2020-12-04 DIAGNOSIS — F450 Somatization disorder: Secondary | ICD-10-CM

## 2020-12-04 DIAGNOSIS — F5101 Primary insomnia: Secondary | ICD-10-CM

## 2020-12-04 MED ORDER — ALPRAZOLAM 0.5 MG PO TABS: 1.0000 mg | ORAL_TABLET | Freq: Every day | ORAL | 0 refills | Status: DC

## 2020-12-04 NOTE — Telephone Encounter (Signed)
Last seen 09/18/2020 Last written 07/23/2020 for 6 month supply

## 2020-12-05 ENCOUNTER — Telehealth (HOSPITAL_COMMUNITY): Payer: Self-pay | Admitting: *Deleted

## 2020-12-05 NOTE — Telephone Encounter (Signed)
Close enounter

## 2020-12-06 ENCOUNTER — Ambulatory Visit (HOSPITAL_COMMUNITY)
Admission: RE | Admit: 2020-12-06 | Payer: Medicare Other | Source: Ambulatory Visit | Attending: Cardiology | Admitting: Cardiology

## 2020-12-06 ENCOUNTER — Telehealth: Payer: Self-pay | Admitting: Cardiology

## 2020-12-06 NOTE — Telephone Encounter (Signed)
Returned call to patient left message on personal voice mail stress test will need to be rescheduled.After reviewing chart stress already rescheduled to 12/18/20 at 12:30 pm.

## 2020-12-06 NOTE — Telephone Encounter (Signed)
PT HAS STRESS TEST THIS MORNING AND STATES THAT SHE HAS HAD CAFFEINE THIS MORNING AND WOULD LIKE TO KNOW IF SHE SHOULD RESCHEDULE BECAUSE OF THIS

## 2020-12-13 ENCOUNTER — Telehealth (HOSPITAL_COMMUNITY): Payer: Self-pay | Admitting: *Deleted

## 2020-12-13 NOTE — Telephone Encounter (Signed)
Close encounter 

## 2020-12-18 ENCOUNTER — Other Ambulatory Visit: Payer: Self-pay

## 2020-12-18 ENCOUNTER — Ambulatory Visit (HOSPITAL_COMMUNITY)
Admission: RE | Admit: 2020-12-18 | Discharge: 2020-12-18 | Disposition: A | Discharge status: Home / Self Care | Payer: Medicare Other | Source: Ambulatory Visit | Attending: Cardiovascular Disease | Admitting: Cardiovascular Disease

## 2020-12-18 DIAGNOSIS — R079 Chest pain, unspecified: Secondary | ICD-10-CM | POA: Diagnosis not present

## 2020-12-18 LAB — MYOCARDIAL PERFUSION IMAGING
Estimated workload: 9.3 METS
Exercise duration (min): 8 min
Exercise duration (sec): 11 s
LV dias vol: 64 mL (ref 46–106)
LV sys vol: 15 mL
MPHR: 155 {beats}/min
Peak HR: 144 {beats}/min
Percent HR: 92 %
Rest HR: 82 {beats}/min
SDS: 0
SRS: 0
SSS: 0
TID: 1.02

## 2020-12-18 MED ORDER — TECHNETIUM TC 99M TETROFOSMIN IV KIT
10.1000 | PACK | Freq: Once | INTRAVENOUS | Status: AC | PRN
  Administered 2020-12-18: 10.1 via INTRAVENOUS
  Filled 2020-12-18: qty 11

## 2020-12-18 MED ORDER — TECHNETIUM TC 99M TETROFOSMIN IV KIT
29.4000 | PACK | Freq: Once | INTRAVENOUS | Status: AC | PRN
  Administered 2020-12-18: 29.4 via INTRAVENOUS
  Filled 2020-12-18: qty 30

## 2020-12-20 ENCOUNTER — Ambulatory Visit: Payer: Medicare Other | Admitting: Physician Assistant

## 2020-12-27 ENCOUNTER — Encounter: Payer: Self-pay | Admitting: Gastroenterology

## 2020-12-27 ENCOUNTER — Other Ambulatory Visit: Payer: Self-pay

## 2020-12-27 ENCOUNTER — Ambulatory Visit (AMBULATORY_SURGERY_CENTER): Payer: Medicare Other | Admitting: Gastroenterology

## 2020-12-27 VITALS — BP 106/62 | HR 76 | Temp 97.3°F | Resp 16 | Ht 60.0 in | Wt 139.0 lb

## 2020-12-27 DIAGNOSIS — K222 Esophageal obstruction: Secondary | ICD-10-CM

## 2020-12-27 DIAGNOSIS — K449 Diaphragmatic hernia without obstruction or gangrene: Secondary | ICD-10-CM | POA: Diagnosis not present

## 2020-12-27 DIAGNOSIS — R1013 Epigastric pain: Secondary | ICD-10-CM

## 2020-12-27 DIAGNOSIS — K259 Gastric ulcer, unspecified as acute or chronic, without hemorrhage or perforation: Secondary | ICD-10-CM

## 2020-12-27 MED ORDER — SODIUM CHLORIDE 0.9 % IV SOLN: 500.0000 mL | Freq: Once | INTRAVENOUS | Status: AC

## 2020-12-27 MED ORDER — ESOMEPRAZOLE MAGNESIUM 20 MG PO PACK: 20.0000 mg | PACK | Freq: Every day | ORAL | 3 refills | Status: DC

## 2020-12-27 NOTE — Op Note (Signed)
[place] Patient Name: Patty [name] Procedure Date: 12/27/2020 9:55 AM MRN: [id number] Endoscopist: Justice Britain , MD Age: 65 Referring MD:  Date of Birth: [birth year] Gender: Female Account #: [id number] Procedure:                Upper GI endoscopy Indications:              Follow-up of gastric ulcer Medicines:                Monitored Anesthesia Care Procedure:                Pre-Anesthesia Assessment:                           - Prior to the procedure, a History and Physical                            was performed, and patient medications and                            allergies were reviewed. The patient's tolerance of                            previous anesthesia was also reviewed. The risks                            and benefits of the procedure and the sedation                            options and risks were discussed with the patient.                            All questions were answered, and informed consent                            was obtained. Prior Anticoagulants: The patient has                            taken no previous anticoagulant or antiplatelet                            agents. ASA Grade Assessment: III - A patient with                            severe systemic disease. After reviewing the risks                            and benefits, the patient was deemed in                            satisfactory condition to undergo the procedure.                           After obtaining informed consent, the endoscope was  passed under direct vision. Throughout the                            procedure, the patient's blood pressure, pulse, and                            oxygen saturations were monitored continuously. The                            GIF [id number] #[id number] was introduced through the                            mouth, and advanced to the second part of duodenum.                            The upper GI endoscopy was  accomplished without                            difficulty. The patient tolerated the procedure. Scope In: Scope Out: Findings:                 No gross lesions were noted in the entire esophagus.                           A widely patent Schatzki ring was found at the                            gastroesophageal junction.                           The Z-line was regular and was found 37 cm from the                            incisors.                           A 2 cm hiatal hernia was present.                           A medium amount of food (residue) was found in the                            entire examined stomach. Lavage of the entire area                            was performed using copious amounts, resulting in                            clearance with adequate visualization, especially                            in the region of the previous ulcers within the  distal body/antrum.                           No other gross lesions were noted in the entire                            examined stomach with evidence of healed ulcers.                           No gross lesions were noted in the duodenal bulb,                            in the first portion of the duodenum and in the                            second portion of the duodenum. Complications:            No immediate complications. Estimated Blood Loss:     Estimated blood loss: none. Impression:               - No gross lesions in esophagus.                           - Widely patent Schatzki ring.                           - Z-line regular, 37 cm from the incisors.                           - 2 cm hiatal hernia.                           - A medium amount of food (residue) in the stomach                            - lavaged to evaluate the region. No gross lesions                            in the stomach with evidence of healed ulcers and                            gastric tissue in distal  body/antrum.                           - No gross lesions in the duodenal bulb, in the                            first portion of the duodenum and in the second                            portion of the duodenum. Recommendation:           - The patient will be observed post-procedure,  until all discharge criteria are met.                           - Discharge patient to home.                           - Patient has a contact number available for                            emergencies. The signs and symptoms of potential                            delayed complications were discussed with the                            patient. Return to normal activities tomorrow.                            Written discharge instructions were provided to the                            patient.                           - Resume previous diet.                           - May decrease Nexium to 40 mg daily for 1 month.                            Then can decrease to 20 mg daily for 1 month (can                            get over-the-counter or if Rx is desired we can                            send). Maintain at that dosing for a period in time.                           - Defer to patient if she feels she needs Carafate                            at this point, as the stomach lining in the region                            of the previous ulcers has healed. If she is still                            taking 4 times a day then decrease to twice daily                            for 2-weeks and then can stop thereafter. If she is  already taking it twice daily she can stop at her                            convenience. If she finds effectiveness may                            continue this, but try to only take twice daily.                           - Observe patient's clinical course.                           - The findings and recommendations were  discussed                            with the patient.                           - The findings and recommendations were discussed                            with the patient's family. Justice Britain, MD 12/27/2020 10:29:30 AM

## 2020-12-27 NOTE — Patient Instructions (Signed)
Thank you for allowing [place] to care for you today!  Resume previous diet today.,  May decrease Nexium to 40 mg one daily for one month.  THen decrease to 20 mg daily.  Maintain at 20 mg for a few months.  IF you feel you you no longer need Carafate then decrease from 4 times daily for a few weeks, then 2 times daily for a few weeks.  May continue at 2 times daily unless you feel completely healed, then may totally discontinue.  Contact us with any symptoms or concerns.  Return to your normal daily activities tomorrow, 12/28/20.    YOU HAD AN ENDOSCOPIC PROCEDURE TODAY AT [facility]:   Refer to the procedure report that was given to you for any specific questions about what was found during the examination.  If the procedure report does not answer your questions, please call your gastroenterologist to clarify.  If you requested that your care partner not be given the details of your procedure findings, then the procedure report has been included in a sealed envelope for you to review at your convenience later.  YOU SHOULD EXPECT: Some feelings of bloating in the abdomen. Passage of more gas than usual.  Walking can help get rid of the air that was put into your GI tract during the procedure and reduce the bloating. If you had a lower endoscopy (such as a colonoscopy or flexible sigmoidoscopy) you may notice spotting of blood in your stool or on the toilet paper. If you underwent a bowel prep for your procedure, you may not have a normal bowel movement for a few days.  Please Note:  You might notice some irritation and congestion in your nose or some drainage.  This is from the oxygen used during your procedure.  There is no need for concern and it should clear up in a day or so.  SYMPTOMS TO REPORT IMMEDIATELY:  Following upper endoscopy (EGD)  Vomiting of blood or coffee ground material  New chest pain or pain under the shoulder blades  Painful or persistently difficult  swallowing  New shortness of breath  Fever of 100F or higher  Black, tarry-looking stools  For urgent or emergent issues, a gastroenterologist can be reached at any hour by calling [phone]. Do not use MyChart messaging for urgent concerns.    DIET:  We do recommend a small meal at first, but then you may proceed to your regular diet.  Drink plenty of fluids but you should avoid alcoholic beverages for 24 hours.  ACTIVITY:  You should plan to take it easy for the rest of today and you should NOT DRIVE or use heavy machinery until tomorrow (because of the sedation medicines used during the test).    FOLLOW UP: Our staff will call the number listed on your records 48-72 hours following your procedure to check on you and address any questions or concerns that you may have regarding the information given to you following your procedure. If we do not reach you, we will leave a message.  We will attempt to reach you two times.  During this call, we will ask if you have developed any symptoms of COVID 19. If you develop any symptoms (ie: fever, flu-like symptoms, shortness of breath, cough etc.) before then, please call [phone].  If you test positive for Covid 19 in the 2 weeks post procedure, please call and report this information to [place].    If any biopsies were taken  you will be contacted by phone or by letter within the next 1-3 weeks.  Please call us at [phone] if you have not heard about the biopsies in 3 weeks.    SIGNATURES/CONFIDENTIALITY: You and/or your care partner have signed paperwork which will be entered into your electronic medical record.  These signatures attest to the fact that that the information above on your After Visit Summary has been reviewed and is understood.  Full responsibility of the confidentiality of this discharge information lies with you and/or your care-partner.

## 2020-12-27 NOTE — Progress Notes (Signed)
A/ox3, pleased with MAC, report to RN 

## 2020-12-31 ENCOUNTER — Telehealth: Payer: Self-pay | Admitting: *Deleted

## 2020-12-31 NOTE — Telephone Encounter (Signed)
First follow up call attempt.  LVM. 

## 2021-01-01 ENCOUNTER — Ambulatory Visit: Payer: Medicare Other | Admitting: Medical

## 2021-01-10 ENCOUNTER — Encounter: Payer: Self-pay | Admitting: Physician Assistant

## 2021-02-03 ENCOUNTER — Other Ambulatory Visit: Payer: Self-pay

## 2021-02-03 ENCOUNTER — Ambulatory Visit: Payer: Medicare Other | Admitting: Medical

## 2021-02-04 ENCOUNTER — Ambulatory Visit (INDEPENDENT_AMBULATORY_CARE_PROVIDER_SITE_OTHER): Payer: Medicare Other | Admitting: Medical

## 2021-02-04 ENCOUNTER — Other Ambulatory Visit: Payer: Self-pay

## 2021-02-04 ENCOUNTER — Encounter: Payer: Self-pay | Admitting: Medical

## 2021-02-04 VITALS — BP 140/80 | HR 91 | Resp 18 | Ht 61.0 in | Wt 142.6 lb

## 2021-02-04 DIAGNOSIS — K219 Gastro-esophageal reflux disease without esophagitis: Secondary | ICD-10-CM | POA: Diagnosis not present

## 2021-02-04 DIAGNOSIS — E781 Pure hyperglyceridemia: Secondary | ICD-10-CM | POA: Diagnosis not present

## 2021-02-04 DIAGNOSIS — I10 Essential (primary) hypertension: Secondary | ICD-10-CM

## 2021-02-04 DIAGNOSIS — G4700 Insomnia, unspecified: Secondary | ICD-10-CM

## 2021-02-04 DIAGNOSIS — I214 Non-ST elevation (NSTEMI) myocardial infarction: Secondary | ICD-10-CM | POA: Diagnosis not present

## 2021-02-04 MED ORDER — TRAZODONE HCL 50 MG PO TABS: 25.0000 mg | ORAL_TABLET | Freq: Every evening | ORAL | 0 refills | Status: DC | PRN

## 2021-02-04 NOTE — Patient Instructions (Addendum)
Your blood pressure is borderline controlled today. Continue hctz 25 mg daily.  Gerd controlled with nexium daily.  For high cholesterol continue crestor and fenofibrate.  For history of STEMI in th past with elevated troponin continue to follow up with cardiologist as regularly scheduled.  For insomnia recommend that you try trazadone and try to cut back on xanax dosing. Try to use 1/2 of current dosing for one week and then try to stop med.  Placing future labs to get done next week. Cbc, cmp and lipid panel fasting. You need to have appointment for that date.  Follow up in 1 month or as needed

## 2021-02-04 NOTE — Progress Notes (Signed)
Subjective:    Patient ID: Patty [name], female    DOB: 09/13/1955, 65 y.o.   MRN: [id number]  HPI  Pt in for first time with me.  Pt was seeing provider in [place].   Pt has hx of high cholesterol, gerd, htn and history of nstemi.  Pt is on statin crestor 10 mg daily and trcor 145 mg daily.  For htn show is on hctz 25 mg daily.  For gerd pt on nexium and symptoms controlled.   Hx of egd in 12/27/2020. Hx of hiatal hernia.   Echo done 10-10-20.   Pt has cardiologist evaluation in past after chest pai nevent.    11-22-2020 cardiologist visit date.  ASSESSMENT:     1. Chest pain of uncertain etiology  2. Hyperlipidemia, unspecified hyperlipidemia type  3. Hypertension, unspecified type     PLAN:    Chest pain: Atypical in description but does occur when she is stressed and she does have CAD risk factors (age, hypertension, family history).  Very mild troponin elevation during recent ED visit (3 > 19), admission was recommended but she declined.  Calcium score on 10/10/2020 was 1 (53rd percentile).  Echocardiogram on 10/10/2020 showed normal biventricular function, and no significant valvular disease. -Exercise Myoview ordered to evaluate for ischemia   Hypertension: Continue hydrochlorothiazide 25 mg daily   Hyperlipidemia: LDL 140 on 06/14/2020.  Started on rosuvastatin 10 mg daily, LDL 63 on 09/27/2020   RTC in 3 months  H x of insomnia. Pt states has been on xanax for 15 years. She has trouble sleeping. She wonders if needs to be on higher dose. Pt never been on trazadone.     Review of Systems  Constitutional:  Negative for chills, fatigue and fever.  HENT:  Negative for congestion, ear discharge and ear pain.   Respiratory:  Negative for cough, chest tightness, shortness of breath and wheezing.   Cardiovascular:  Negative for chest pain and palpitations.  Gastrointestinal:  Negative for abdominal pain.  Genitourinary:  Negative for dysuria.  Musculoskeletal:   Negative for back pain and joint swelling.  Neurological:  Negative for dizziness, seizures and numbness.  Hematological:  Negative for adenopathy. Does not bruise/bleed easily.  Psychiatric/Behavioral:  Negative for confusion, sleep disturbance and suicidal ideas. The patient is not nervous/anxious.     Past Medical History:  Diagnosis Date   Anxiety    Barrett's esophagus    Cardiomegaly    Dyspareunia    Fatty pancreas    Gastric ulcer    GERD (gastroesophageal reflux disease)    Hx of colonic polyps    Hyperlipidemia    Hypertension    Osteopenia    Osteoporosis    Shingles    Sleep apnea    Thyroid nodule    Uterine leiomyoma      Social History   Socioeconomic History   Marital status: Married    Spouse name: Not on file   Number of children: Not on file   Years of education: Not on file   Highest education level: Not on file  Occupational History   Not on file  Tobacco Use   Smoking status: Never   Smokeless tobacco: Never  Vaping Use   Vaping Use: Never used  Substance and Sexual Activity   Alcohol use: No    Alcohol/week: 0.0 standard drinks   Drug use: No   Sexual activity: Yes    Partners: Male    Birth control/protection: Surgical  Comment: BTL  Other Topics Concern   Not on file  Social History Narrative   Not on file   Social Determinants of Health   Financial Resource Strain: Not on file  Food Insecurity: Not on file  Transportation Needs: Not on file  Physical Activity: Not on file  Stress: Not on file  Social Connections: Not on file  Intimate Partner Violence: Not on file    Past Surgical History:  Procedure Laterality Date   BIOPSY  07/04/2020   Procedure: BIOPSY;  Surgeon: Rush Landmark, Telford Nab., MD;  Location: [facility] END[facility]COPY;  Service: Gastroenterology;;   BIOPSY THYROID     COLONOSCOPY W/ POLYPECTOMY     ESOPHAGOGASTRODUODENOSCOPY (EGD) WITH PROPOFOL N/A 07/04/2020   Procedure: ESOPHAGOGASTRODUODENOSCOPY (EGD) WITH  PROPOFOL;  Surgeon: Irving Copas., MD;  Location: [facility];  Service: Gastroenterology;  Laterality: N/A;   TUBAL LIGATION     UPPER ESOPHAGEAL ENDOSCOPIC ULTRASOUND (EUS) N/A 07/04/2020   Procedure: UPPER ESOPHAGEAL ENDOSCOPIC ULTRASOUND (EUS);  Surgeon: Irving Copas., MD;  Location: [facility];  Service: Gastroenterology;  Laterality: N/A;    Family History  Problem Relation Age of Onset   Hypertension Mother    Heart disease Mother    Heart disease Father    Hypertension Brother    Colon cancer Neg Hx    Esophageal cancer Neg Hx    Pancreatic cancer Neg Hx    Stomach cancer Neg Hx    Liver disease Neg Hx    Inflammatory bowel disease Neg Hx    Rectal cancer Neg Hx     No Known Allergies  Current Outpatient Medications on File Prior to Visit  Medication Sig Dispense Refill   acetaminophen (TYLENOL) 500 MG tablet Take 500-1,000 mg by mouth every 6 (six) hours as needed (for pain.).     bismuth subsalicylate (PEPTO BISMOL) 262 MG/15ML suspension Take 30 mLs by mouth every 6 (six) hours as needed for indigestion.     esomeprazole (NEXIUM) 20 MG packet Take 20 mg by mouth daily before breakfast. 30 each 3   esomeprazole (NEXIUM) 40 MG capsule Take 1 capsule (40 mg total) by mouth in the morning and at bedtime. 60 capsule 6   fenofibrate (TRICOR) 145 MG tablet Take 1 tablet (145 mg total) by mouth daily. 90 tablet 2   hydrochlorothiazide (HYDRODIURIL) 25 MG tablet Take 1 tablet (25 mg total) by mouth daily. 90 tablet 2   nitroGLYCERIN (NITROSTAT) 0.4 MG SL tablet Place 1 tablet (0.4 mg total) under the tongue every 5 (five) minutes as needed. 25 tablet 3   rosuvastatin (CRESTOR) 10 MG tablet Take 1 tablet (10 mg total) by mouth daily. 90 tablet 2   sucralfate (CARAFATE) 1 g tablet TAKE ONE TABLET FOUR TIMES PER DAY. IF UNABLE TO SWALLOW, YOU CAN CRUSH ONE TABLET AND DISSOLVE IN 10ML OF WARM WATER, MIX WELL TO CREATE SLURRY AND DRINK FOUR TIMES DAILY 120 tablet  0   Current Facility-Administered Medications on File Prior to Visit  Medication Dose Route Frequency Provider Last Rate Last Admin   0.9 %  sodium chloride infusion  500 mL Intravenous Once Mansouraty, Telford Nab., MD       nitroGLYCERIN (NITROSTAT) SL tablet 0.4 mg  0.4 mg Sublingual Q5 min PRN Donato Heinz, MD   0.4 mg at 09/27/20 1550    BP 140/80   Pulse 91   Resp 18   Ht '5\' 1"'$  (1.549 m)   Wt 142 lb 9.6 oz (64.7 kg)  LMP 07/06/2008   SpO2 95%   BMI 26.94 kg/m       Objective:   Physical Exam  General Mental Status- Alert. General Appearance- Not in acute distress.   Skin General: Color- Normal Color. Moisture- Normal Moisture.  Neck Carotid Arteries- Normal color. Moisture- Normal Moisture. No carotid bruits. No JVD.  Chest and Lung Exam Auscultation: Breath Sounds:-Normal.  Cardiovascular Auscultation:Rythm- Regular. Murmurs & Other Heart Sounds:Auscultation of the heart reveals- No Murmurs.  Abdomen Inspection:-Inspeection Normal. Palpation/Percussion:Note:No mass. Palpation and Percussion of the abdomen reveal- Non Tender, Non Distended + BS, no rebound or guarding.   Neurologic Cranial Nerve exam:- CN III-XII intact(No nystagmus), symmetric smile. Strength:- 5/5 equal and symmetric strength both upper and lower extremities.       Assessment & Plan:  Your blood pressure is borderline controlled today. Continue hctz 25 mg daily.  Gerd controlled with nexium daily.  For high cholesterol continue crestor and fenofibrate.  For history of STEMI in th past with elevated troponin continue to follow up with cardiologist as regularly scheduled.  For insomnia recommend that you try trazadone and try to cut back on xanax dosing. Try to use 1/2 of current dosing for one week and then try to stop med.  Placing future labs to get done next week. Cbc, cmp and lipid panel fasting. You need to have appointment for that date.  Follow up in 1 month or  as needed  [employer], PA-C

## 2021-02-11 ENCOUNTER — Telehealth: Payer: Self-pay | Admitting: *Deleted

## 2021-02-11 NOTE — Telephone Encounter (Signed)
Pt has lab appointment tomorrow but I do not see any future orders in Epic.  Please place future orders if appropriate or call pt to cancel lab appt if labs not needed at this time.

## 2021-02-11 NOTE — Telephone Encounter (Signed)
Orders dropped

## 2021-02-11 NOTE — Addendum Note (Signed)
Addended by: Jeronimo Greaves on: 02/11/2021 03:40 PM   Modules accepted: Orders

## 2021-02-12 ENCOUNTER — Other Ambulatory Visit: Payer: Self-pay

## 2021-02-12 ENCOUNTER — Other Ambulatory Visit (INDEPENDENT_AMBULATORY_CARE_PROVIDER_SITE_OTHER): Payer: Medicare Other

## 2021-02-12 DIAGNOSIS — E781 Pure hyperglyceridemia: Secondary | ICD-10-CM

## 2021-02-12 DIAGNOSIS — I10 Essential (primary) hypertension: Secondary | ICD-10-CM | POA: Diagnosis not present

## 2021-02-12 LAB — LIPID PANEL
Cholesterol: 134 mg/dL (ref 0–200)
HDL: 51.9 mg/dL (ref >39.00)
LDL Cholesterol: 55 mg/dL (ref 0–99)
NonHDL: 82.35
Total CHOL/HDL Ratio: 3
Triglycerides: 138 mg/dL (ref 0.0–149.0)
VLDL: 27.6 mg/dL (ref 0.0–40.0)

## 2021-02-12 LAB — COMPREHENSIVE METABOLIC PANEL
ALT: 14 U/L (ref 0–35)
AST: 13 U/L (ref 0–37)
Albumin: 4.6 g/dL (ref 3.5–5.2)
Alkaline Phosphatase: 63 U/L (ref 39–117)
BUN: 24 mg/dL — ABNORMAL HIGH (ref 6–23)
CO2: 27 mEq/L (ref 19–32)
Calcium: 10 mg/dL (ref 8.4–10.5)
Chloride: 103 mEq/L (ref 96–112)
Creatinine, Ser: 0.88 mg/dL (ref 0.40–1.20)
GFR: 68.71 mL/min (ref >60.00)
Glucose, Bld: 107 mg/dL — ABNORMAL HIGH (ref 70–99)
Potassium: 4 mEq/L (ref 3.5–5.1)
Sodium: 140 mEq/L (ref 135–145)
Total Bilirubin: 0.6 mg/dL (ref 0.2–1.2)
Total Protein: 7.1 g/dL (ref 6.0–8.3)

## 2021-02-12 LAB — CBC
HCT: 40.7 % (ref 36.0–46.0)
Hemoglobin: 13.5 g/dL (ref 12.0–15.0)
MCHC: 33.2 g/dL (ref 30.0–36.0)
MCV: 88.8 fl (ref 78.0–100.0)
Platelets: 329 10*3/uL (ref 150.0–400.0)
RBC: 4.58 Mil/uL (ref 3.87–5.11)
RDW: 13 % (ref 11.5–15.5)
WBC: 5.8 10*3/uL (ref 4.0–10.5)

## 2021-02-13 ENCOUNTER — Other Ambulatory Visit (INDEPENDENT_AMBULATORY_CARE_PROVIDER_SITE_OTHER): Payer: Medicare Other

## 2021-02-13 DIAGNOSIS — R7309 Other abnormal glucose: Secondary | ICD-10-CM

## 2021-02-13 LAB — HEMOGLOBIN A1C: Hgb A1c MFr Bld: 6.1 % (ref 4.6–6.5)

## 2021-02-24 ENCOUNTER — Other Ambulatory Visit: Payer: Self-pay | Admitting: Medical

## 2021-03-17 ENCOUNTER — Other Ambulatory Visit: Payer: Self-pay | Admitting: Neurology

## 2021-03-17 DIAGNOSIS — F450 Somatization disorder: Secondary | ICD-10-CM

## 2021-03-17 DIAGNOSIS — F5101 Primary insomnia: Secondary | ICD-10-CM

## 2021-03-25 ENCOUNTER — Ambulatory Visit (INDEPENDENT_AMBULATORY_CARE_PROVIDER_SITE_OTHER): Payer: Medicare Other | Admitting: Medical

## 2021-03-25 ENCOUNTER — Other Ambulatory Visit: Payer: Self-pay

## 2021-03-25 VITALS — BP 139/89 | HR 85 | Temp 98.6°F | Resp 18 | Ht 61.0 in | Wt 144.0 lb

## 2021-03-25 DIAGNOSIS — G4700 Insomnia, unspecified: Secondary | ICD-10-CM

## 2021-03-25 DIAGNOSIS — R1031 Right lower quadrant pain: Secondary | ICD-10-CM | POA: Diagnosis not present

## 2021-03-25 DIAGNOSIS — Z79899 Other long term (current) drug therapy: Secondary | ICD-10-CM

## 2021-03-25 DIAGNOSIS — R1084 Generalized abdominal pain: Secondary | ICD-10-CM | POA: Diagnosis not present

## 2021-03-25 DIAGNOSIS — R109 Unspecified abdominal pain: Secondary | ICD-10-CM | POA: Diagnosis not present

## 2021-03-25 LAB — POC URINALSYSI DIPSTICK (AUTOMATED)
Bilirubin, UA: NEGATIVE
Blood, UA: NEGATIVE
Glucose, UA: NEGATIVE
Ketones, UA: NEGATIVE
Leukocytes, UA: NEGATIVE
Nitrite, UA: NEGATIVE
Protein, UA: NEGATIVE
Spec Grav, UA: 1.015 (ref 1.010–1.025)
Urobilinogen, UA: 0.2 E.U./dL
pH, UA: 6 (ref 5.0–8.0)

## 2021-03-25 LAB — COMPREHENSIVE METABOLIC PANEL
ALT: 15 U/L (ref 0–35)
AST: 16 U/L (ref 0–37)
Albumin: 4.7 g/dL (ref 3.5–5.2)
Alkaline Phosphatase: 71 U/L (ref 39–117)
BUN: 18 mg/dL (ref 6–23)
CO2: 27 mEq/L (ref 19–32)
Calcium: 10.6 mg/dL — ABNORMAL HIGH (ref 8.4–10.5)
Chloride: 103 mEq/L (ref 96–112)
Creatinine, Ser: 0.8 mg/dL (ref 0.40–1.20)
GFR: 76.98 mL/min (ref >60.00)
Glucose, Bld: 105 mg/dL — ABNORMAL HIGH (ref 70–99)
Potassium: 3.5 mEq/L (ref 3.5–5.1)
Sodium: 141 mEq/L (ref 135–145)
Total Bilirubin: 0.5 mg/dL (ref 0.2–1.2)
Total Protein: 7.3 g/dL (ref 6.0–8.3)

## 2021-03-25 LAB — LIPASE: Lipase: 56 U/L (ref 11.0–59.0)

## 2021-03-25 MED ORDER — FAMOTIDINE 20 MG PO TABS: 20.0000 mg | ORAL_TABLET | Freq: Two times a day (BID) | ORAL | 0 refills | Status: DC

## 2021-03-25 MED ORDER — ALPRAZOLAM 0.5 MG PO TABS: ORAL_TABLET | ORAL | 0 refills | Status: DC

## 2021-03-25 NOTE — Progress Notes (Signed)
Subjective:    Patient ID: Patty [name], female    DOB: August 04, 1955, 65 y.o.   MRN: [id number]  HPI  Pt in for some abdomen pain. She states pain has been coming and going. She points to epigastic area. She is also reporting some rt flank area pain as well.   Pt has stomach ulcer 3 times in the past.     Also epigastric pain for 2 months on and off.  June 2022 egd   No gross lesions in esophagus. - Widely patent Schatzki ring. - Z-line regular, 37 cm from the incisors. - 2 cm hiatal hernia. - A medium amount of food (residue) in the stomach - lavaged to evaluate the region. No gross lesions in the stomach with evidence of healed ulcers and gastric tissue in distal body/antrum. - No gross lesions in the duodenal bulb, in the first portion of the duodenum and in the second portion of the duodenum.   She states flank pain on and off for 2 months as well.   See exam below. But notes also some pain in rt lower quadrant and suprapubic area for 2 months as well.  Also hx of insomnia. Failed trazadone and failed ambien. She wants to resart xanax. Formerly given by other provider.s.  Review of Systems  Constitutional:  Negative for chills, fatigue and fever.  Respiratory:  Negative for chest tightness, shortness of breath and wheezing.   Gastrointestinal:  Positive for abdominal pain. Negative for abdominal distention, diarrhea, nausea and vomiting.       Does feel heart burn recently and feeling gassy.  Genitourinary:  Negative for dyspareunia, dysuria, flank pain, frequency, pelvic pain and urgency.  Musculoskeletal:  Negative for back pain, joint swelling, myalgias and neck pain.       Rt flank area pain.  Neurological:  Negative for dizziness and headaches.  Hematological:  Negative for adenopathy. Does not bruise/bleed easily.  Psychiatric/Behavioral:  Negative for behavioral problems and confusion.     Past Medical History:  Diagnosis Date   Anxiety    Barrett's  esophagus    Cardiomegaly    Dyspareunia    Fatty pancreas    Gastric ulcer    GERD (gastroesophageal reflux disease)    Hx of colonic polyps    Hyperlipidemia    Hypertension    Osteopenia    Osteoporosis    Shingles    Sleep apnea    Thyroid nodule    Uterine leiomyoma      Social History   Socioeconomic History   Marital status: Married    Spouse name: Not on file   Number of children: Not on file   Years of education: Not on file   Highest education level: Not on file  Occupational History   Not on file  Tobacco Use   Smoking status: Never   Smokeless tobacco: Never  Vaping Use   Vaping Use: Never used  Substance and Sexual Activity   Alcohol use: No    Alcohol/week: 0.0 standard drinks   Drug use: No   Sexual activity: Yes    Partners: Male    Birth control/protection: Surgical    Comment: BTL  Other Topics Concern   Not on file  Social History Narrative   Not on file   Social Determinants of Health   Financial Resource Strain: Not on file  Food Insecurity: Not on file  Transportation Needs: Not on file  Physical Activity: Not on file  Stress: Not on  file  Social Connections: Not on file  Intimate Partner Violence: Not on file    Past Surgical History:  Procedure Laterality Date   BIOPSY  07/04/2020   Procedure: BIOPSY;  Surgeon: Rush Landmark, Telford Nab., MD;  Location: [facility]COPY;  Service: Gastroenterology;;   BIOPSY THYROID     COLONOSCOPY W/ POLYPECTOMY     ESOPHAGOGASTRODUODENOSCOPY (EGD) WITH PROPOFOL N/A 07/04/2020   Procedure: ESOPHAGOGASTRODUODENOSCOPY (EGD) WITH PROPOFOL;  Surgeon: Irving Copas., MD;  Location: [place];  Service: Gastroenterology;  Laterality: N/A;   TUBAL LIGATION     UPPER ESOPHAGEAL ENDOSCOPIC ULTRASOUND (EUS) N/A 07/04/2020   Procedure: UPPER ESOPHAGEAL ENDOSCOPIC ULTRASOUND (EUS);  Surgeon: Irving Copas., MD;  Location: [place];  Service: Gastroenterology;  Laterality: N/A;     Family History  Problem Relation Age of Onset   Hypertension Mother    Heart disease Mother    Heart disease Father    Hypertension Brother    Colon cancer Neg Hx    Esophageal cancer Neg Hx    Pancreatic cancer Neg Hx    Stomach cancer Neg Hx    Liver disease Neg Hx    Inflammatory bowel disease Neg Hx    Rectal cancer Neg Hx     No Known Allergies  Current Outpatient Medications on File Prior to Visit  Medication Sig Dispense Refill   fenofibrate (TRICOR) 145 MG tablet Take 1 tablet (145 mg total) by mouth daily. 90 tablet 2   hydrochlorothiazide (HYDRODIURIL) 25 MG tablet Take 1 tablet (25 mg total) by mouth daily. 90 tablet 2   rosuvastatin (CRESTOR) 10 MG tablet Take 1 tablet (10 mg total) by mouth daily. 90 tablet 2   sucralfate (CARAFATE) 1 g tablet TAKE ONE TABLET FOUR TIMES PER DAY. IF UNABLE TO SWALLOW, YOU CAN CRUSH ONE TABLET AND DISSOLVE IN 10ML OF WARM WATER, MIX WELL TO CREATE SLURRY AND DRINK FOUR TIMES DAILY 120 tablet 0   traZODone (DESYREL) 50 MG tablet TAKE 1/2 TO 1 TABLET BY  MOUTH AT BEDTIME AS NEEDED  FOR SLEEP 30 tablet 11   acetaminophen (TYLENOL) 500 MG tablet Take 500-1,000 mg by mouth every 6 (six) hours as needed (for pain.).     bismuth subsalicylate (PEPTO BISMOL) 262 MG/15ML suspension Take 30 mLs by mouth every 6 (six) hours as needed for indigestion.     esomeprazole (NEXIUM) 20 MG packet Take 20 mg by mouth daily before breakfast. 30 each 3   esomeprazole (NEXIUM) 40 MG capsule Take 1 capsule (40 mg total) by mouth in the morning and at bedtime. 60 capsule 6   nitroGLYCERIN (NITROSTAT) 0.4 MG SL tablet Place 1 tablet (0.4 mg total) under the tongue every 5 (five) minutes as needed. 25 tablet 3   Current Facility-Administered Medications on File Prior to Visit  Medication Dose Route Frequency Provider Last Rate Last Admin   0.9 %  sodium chloride infusion  500 mL Intravenous Once Mansouraty, Telford Nab., MD       nitroGLYCERIN (NITROSTAT) SL  tablet 0.4 mg  0.4 mg Sublingual Q5 min PRN Donato Heinz, MD   0.4 mg at 09/27/20 1550    BP 139/89   Pulse 85   Temp 98.6 F (37 C)   Resp 18   Ht 5\' 1"  (1.549 m)   Wt 144 lb (65.3 kg)   LMP 07/06/2008   SpO2 100%   BMI 27.21 kg/m       Objective:   Physical Exam  General- No acute distress. Pleasant patient. Neck- Full range of motion, no jvd Lungs- Clear, even and unlabored. Heart- regular rate and rhythm. Neurologic- CNII- XII grossly intact.   Abdomen- mild epigastric, rt flank pain and moderate rt lower quadrant pain. No rebound or guarding. +bs. Not distended.  Back- faint rt cva tenderness.  Skin- no rash on abdomen or back.       Assessment & Plan:   Patient Instructions  History of recent intermittent epigastric pain, right flank pain and right lower quadrant pain.  These various areas have been painful over the past 2 months.  History of gastric ulcer in the past but EGD in the summer noted that the ulcer looked healed.  Since pain in various areas will do work-up to include CBC, CMP, lipase and H. pylori breath test.  Also since one of the areas of pain in his right lower quadrant we will place order for CT abdomen pelvis with contrast to evaluate appendix.  Other regions would be assessed as well.  Recommend continuing Nexium, Carafate and adding famotidine.  Recommend bland healthy diet.  If abdomen symptoms become severe/change in after hours and recommend ED evaluation.  Note also POCT urinalysis done.  If indicators of infection we will send urine for culture.  History of insomnia in the past.  Failed Ambien and trazodone in the past.  Agreed to prescribe Xanax as former prescribers gave in the past but will prescribe lower dose.  Prescribing Xanax 0.5 mg to use 1 to 2 tablets q hs for insomnia.  Rx advisement given.  Patient will sign contract today and give UDS.  Follow-up in 7 days or sooner if needed.    Mackie Pai, PA-C   Time  spent with patient today was 42  minutes which consisted of chart review, discussing diagnosis, work up treatment and documentation. Not abdomen visiti initialy requiring work up, ct and then controlled med visit as well.(Education on controlled med rules/regulations. Why not prescribing high 2 mg dose as she had been given in past.

## 2021-03-25 NOTE — Patient Instructions (Signed)
History of recent intermittent epigastric pain, right flank pain and right lower quadrant pain.  These various areas have been painful over the past 2 months.  History of gastric ulcer in the past but EGD in the summer noted that the ulcer looked healed.  Since pain in various areas will do work-up to include CBC, CMP, lipase and H. pylori breath test.  Also since one of the areas of pain in his right lower quadrant we will place order for CT abdomen pelvis with contrast to evaluate appendix.  Other regions would be assessed as well.  Recommend continuing Nexium, Carafate and adding famotidine.  Recommend bland healthy diet.  If abdomen symptoms become severe/change in after hours and recommend ED evaluation.  Note also POCT urinalysis done.  If indicators of infection we will send urine for culture.  History of insomnia in the past.  Failed Ambien and trazodone in the past.  Agreed to prescribe Xanax as former prescribers gave in the past but will prescribe lower dose.  Prescribing Xanax 0.5 mg to use 1 to 2 tablets q hs for insomnia.  Rx advisement given.  Patient will sign contract today and give UDS.  Follow-up in 7 days or sooner if needed.

## 2021-03-25 NOTE — Addendum Note (Signed)
Addended by: Kelle Darting A on: 03/25/2021 01:20 PM   Modules accepted: Orders

## 2021-03-25 NOTE — Addendum Note (Signed)
Addended by: Manuela Schwartz on: 03/25/2021 12:01 PM   Modules accepted: Orders

## 2021-03-26 LAB — CBC WITH DIFFERENTIAL/PLATELET
Basophils Absolute: 0.1 10*3/uL (ref 0.0–0.1)
Basophils Relative: 1.4 % (ref 0.0–3.0)
Eosinophils Absolute: 0.3 10*3/uL (ref 0.0–0.7)
Eosinophils Relative: 3.8 % (ref 0.0–5.0)
HCT: 39.3 % (ref 36.0–46.0)
Hemoglobin: 13.2 g/dL (ref 12.0–15.0)
Lymphocytes Relative: 35.1 % (ref 12.0–46.0)
Lymphs Abs: 2.3 10*3/uL (ref 0.7–4.0)
MCHC: 33.6 g/dL (ref 30.0–36.0)
MCV: 88.8 fl (ref 78.0–100.0)
Monocytes Absolute: 0.7 10*3/uL (ref 0.1–1.0)
Monocytes Relative: 9.9 % (ref 3.0–12.0)
Neutro Abs: 3.3 10*3/uL (ref 1.4–7.7)
Neutrophils Relative %: 49.8 % (ref 43.0–77.0)
Platelets: 309 10*3/uL (ref 150.0–400.0)
RBC: 4.43 Mil/uL (ref 3.87–5.11)
RDW: 12.8 % (ref 11.5–15.5)
WBC: 6.7 10*3/uL (ref 4.0–10.5)

## 2021-03-28 LAB — DRUG MONITORING PANEL 376104, URINE

## 2021-03-28 LAB — DM TEMPLATE

## 2021-04-03 ENCOUNTER — Telehealth (HOSPITAL_BASED_OUTPATIENT_CLINIC_OR_DEPARTMENT_OTHER): Payer: Self-pay

## 2021-04-04 ENCOUNTER — Ambulatory Visit (HOSPITAL_BASED_OUTPATIENT_CLINIC_OR_DEPARTMENT_OTHER)
Admission: RE | Admit: 2021-04-04 | Discharge: 2021-04-04 | Disposition: A | Discharge status: Home / Self Care | Payer: Medicare Other | Source: Ambulatory Visit | Attending: Medical | Admitting: Medical

## 2021-04-04 ENCOUNTER — Other Ambulatory Visit: Payer: Self-pay

## 2021-04-04 ENCOUNTER — Encounter (HOSPITAL_BASED_OUTPATIENT_CLINIC_OR_DEPARTMENT_OTHER): Payer: Self-pay

## 2021-04-04 DIAGNOSIS — R1031 Right lower quadrant pain: Secondary | ICD-10-CM | POA: Diagnosis not present

## 2021-04-04 LAB — DRUG MONITORING, PANEL 8 WITH CONFIRMATION, URINE
6 Acetylmorphine: NEGATIVE ng/mL (ref <10)
Alcohol Metabolites: NEGATIVE ng/mL (ref <500)
Alphahydroxyalprazolam: 66 ng/mL — ABNORMAL HIGH (ref <25)
Alphahydroxymidazolam: NEGATIVE ng/mL (ref <50)
Alphahydroxytriazolam: NEGATIVE ng/mL (ref <50)
Aminoclonazepam: NEGATIVE ng/mL (ref <25)
Amphetamines: NEGATIVE ng/mL (ref <500)
Benzodiazepines: POSITIVE ng/mL — AB (ref <100)
Buprenorphine, Urine: NEGATIVE ng/mL (ref <5)
Cocaine Metabolite: NEGATIVE ng/mL (ref <150)
Creatinine: 36 mg/dL (ref >=20.0)
Hydroxyethylflurazepam: NEGATIVE ng/mL (ref <50)
Lorazepam: NEGATIVE ng/mL (ref <50)
MDMA: NEGATIVE ng/mL (ref <500)
Marijuana Metabolite: NEGATIVE ng/mL (ref <20)
Nordiazepam: NEGATIVE ng/mL (ref <50)
Opiates: NEGATIVE ng/mL (ref <100)
Oxazepam: NEGATIVE ng/mL (ref <50)
Oxidant: NEGATIVE ug/mL (ref <200)
Oxycodone: NEGATIVE ng/mL (ref <100)
Temazepam: NEGATIVE ng/mL (ref <50)
pH: 7.2 (ref 4.5–9.0)

## 2021-04-04 LAB — H. PYLORI BREATH TEST: H. pylori Breath Test: NOT DETECTED

## 2021-04-04 LAB — DM TEMPLATE

## 2021-04-04 MED ORDER — IOHEXOL 350 MG/ML SOLN
100.0000 mL | Freq: Once | INTRAVENOUS | Status: AC | PRN
  Administered 2021-04-04: 85 mL via INTRAVENOUS

## 2021-04-07 ENCOUNTER — Other Ambulatory Visit: Payer: Self-pay | Admitting: Medical

## 2021-04-07 ENCOUNTER — Encounter: Payer: Self-pay | Admitting: Medical

## 2021-04-07 ENCOUNTER — Telehealth: Payer: Self-pay | Admitting: Medical

## 2021-04-07 MED ORDER — FAMOTIDINE 20 MG PO TABS: 20.0000 mg | ORAL_TABLET | Freq: Two times a day (BID) | ORAL | 0 refills | Status: DC

## 2021-04-07 NOTE — Telephone Encounter (Signed)
Chart opened to rx med, order lab, review chart, respond to my chart message or send message to staff member  

## 2021-04-18 ENCOUNTER — Other Ambulatory Visit: Payer: Self-pay | Admitting: Medical

## 2021-05-03 ENCOUNTER — Other Ambulatory Visit: Payer: Self-pay | Admitting: Medical

## 2021-06-11 ENCOUNTER — Other Ambulatory Visit: Payer: Self-pay | Admitting: Physician Assistant

## 2021-06-11 ENCOUNTER — Encounter: Payer: Self-pay | Admitting: Medical

## 2021-06-11 ENCOUNTER — Other Ambulatory Visit: Payer: Self-pay | Admitting: Medical

## 2021-06-11 ENCOUNTER — Telehealth: Payer: Self-pay | Admitting: Medical

## 2021-06-11 DIAGNOSIS — G4700 Insomnia, unspecified: Secondary | ICD-10-CM

## 2021-06-11 DIAGNOSIS — Z79899 Other long term (current) drug therapy: Secondary | ICD-10-CM

## 2021-06-11 MED ORDER — ALPRAZOLAM 0.5 MG PO TABS: ORAL_TABLET | ORAL | 0 refills | Status: DC

## 2021-06-11 NOTE — Telephone Encounter (Signed)
Rx refill xanax.  Mackie Pai, PA-C

## 2021-06-20 ENCOUNTER — Other Ambulatory Visit: Payer: Self-pay | Admitting: Physician Assistant

## 2021-06-20 DIAGNOSIS — Z79899 Other long term (current) drug therapy: Secondary | ICD-10-CM

## 2021-06-20 DIAGNOSIS — G4700 Insomnia, unspecified: Secondary | ICD-10-CM

## 2021-06-26 ENCOUNTER — Encounter: Payer: Self-pay | Admitting: Medical

## 2021-06-26 DIAGNOSIS — E781 Pure hyperglyceridemia: Secondary | ICD-10-CM

## 2021-06-26 MED ORDER — FENOFIBRATE 145 MG PO TABS: 145.0000 mg | ORAL_TABLET | Freq: Every day | ORAL | 2 refills | Status: DC

## 2021-07-14 MED ORDER — ROSUVASTATIN CALCIUM 10 MG PO TABS: 10.0000 mg | ORAL_TABLET | Freq: Every day | ORAL | 2 refills | Status: DC

## 2021-07-14 MED ORDER — HYDROCHLOROTHIAZIDE 25 MG PO TABS: 25.0000 mg | ORAL_TABLET | Freq: Every day | ORAL | 2 refills | Status: DC

## 2021-07-14 MED ORDER — FENOFIBRATE 145 MG PO TABS
145.0000 mg | ORAL_TABLET | Freq: Every day | ORAL | 2 refills | Status: DC
Start: 2021-07-14 — End: 2022-02-11

## 2021-07-14 NOTE — Addendum Note (Signed)
Addended by: Jeronimo Greaves on: 07/14/2021 01:40 PM   Modules accepted: Orders

## 2021-07-14 NOTE — Telephone Encounter (Signed)
Is it ok to refill patient's rosuvastatin 10 MG tablet  And hydrochlorothiazide 25 MG tablet , it was prescribed by another provider

## 2021-07-23 ENCOUNTER — Ambulatory Visit (HOSPITAL_BASED_OUTPATIENT_CLINIC_OR_DEPARTMENT_OTHER)
Admission: RE | Admit: 2021-07-23 | Discharge: 2021-07-23 | Disposition: A | Discharge status: Home / Self Care | Payer: Medicare Other | Source: Ambulatory Visit | Attending: Medical | Admitting: Medical

## 2021-07-23 ENCOUNTER — Other Ambulatory Visit: Payer: Self-pay

## 2021-07-23 ENCOUNTER — Ambulatory Visit (INDEPENDENT_AMBULATORY_CARE_PROVIDER_SITE_OTHER): Payer: Medicare Other | Admitting: Medical

## 2021-07-23 VITALS — BP 129/78 | HR 90 | Resp 18 | Ht 61.0 in | Wt 146.0 lb

## 2021-07-23 DIAGNOSIS — M545 Low back pain, unspecified: Secondary | ICD-10-CM

## 2021-07-23 MED ORDER — MELOXICAM 7.5 MG PO TABS: 7.5000 mg | ORAL_TABLET | Freq: Every day | ORAL | 0 refills | Status: DC

## 2021-07-23 NOTE — Patient Instructions (Addendum)
Chronic intermittent low-level back pain over the years.  Recent 1 month of more severe type pain.  We will get lumbar spine x-ray.  Advise discontinue ibuprofen and Aleve.  Will prescribe meloxicam 7.5 mg to use 1-2 tablet daily.  Also can use lidocaine over-the-counter salon pas patch to mid lower back.  Can try back stretching exercises.  If your pain is persisting into next week despite these measures then would refer to sports medicine.  Blood pressure well controlled.  Continue HCTZ 25 mg.  Insomnia and has been on Xanax 1 to 2 tablets at night.  You would mention that you tried something in [place] that your sister gave you.  You note that it seemed to work better and have less side effects.  If you could send me the name of that medication I can check and see if it is available in the [place].  Evaluate if it is a safer/better option than Xanax.  Hyperlipidemia-continue Crestor.  Follow-up date to be determined after imaging review and update on how you are doing early next week.  Back Exercises These exercises help to make your trunk and back strong. They also help to keep the lower back flexible. Doing these exercises can help to prevent or lessen pain in your lower back. If you have back pain, try to do these exercises 2-3 times each day or as told by your doctor. As you get better, do the exercises once each day. Repeat the exercises more often as told by your doctor. To stop back pain from coming back, do the exercises once each day, or as told by your doctor. Do exercises exactly as told by your doctor. Stop right away if you feel sudden pain or your pain gets worse. Exercises Single knee to chest Do these steps 3-5 times in a row for each leg: Lie on your back on a firm bed or the floor with your legs stretched out. Bring one knee to your chest. Grab your knee or thigh with both hands and hold it in place. Pull on your knee until you feel a gentle stretch in your lower back or  butt. Keep doing the stretch for 10-30 seconds. Slowly let go of your leg and straighten it. Pelvic tilt Do these steps 5-10 times in a row: Lie on your back on a firm bed or the floor with your legs stretched out. Bend your knees so they point up to the ceiling. Your feet should be flat on the floor. Tighten your lower belly (abdomen) muscles to press your lower back against the floor. This will make your tailbone point up to the ceiling instead of pointing down to your feet or the floor. Stay in this position for 5-10 seconds while you gently tighten your muscles and breathe evenly. Cat-cow Do these steps until your lower back bends more easily: Get on your hands and knees on a firm bed or the floor. Keep your hands under your shoulders, and keep your knees under your hips. You may put padding under your knees. Let your head hang down toward your chest. Tighten (contract) the muscles in your belly. Point your tailbone toward the floor so your lower back becomes rounded like the back of a cat. Stay in this position for 5 seconds. Slowly lift your head. Let the muscles of your belly relax. Point your tailbone up toward the ceiling so your back forms a sagging arch like the back of a cow. Stay in this position for 5  seconds.  Press-ups Do these steps 5-10 times in a row: Lie on your belly (face-down) on a firm bed or the floor. Place your hands near your head, about shoulder-width apart. While you keep your back relaxed and keep your hips on the floor, slowly straighten your arms to raise the top half of your body and lift your shoulders. Do not use your back muscles. You may change where you place your hands to make yourself more comfortable. Stay in this position for 5 seconds. Keep your back relaxed. Slowly return to lying flat on the floor.  Bridges Do these steps 10 times in a row: Lie on your back on a firm bed or the floor. Bend your knees so they point up to the ceiling. Your feet  should be flat on the floor. Your arms should be flat at your sides, next to your body. Tighten your butt muscles and lift your butt off the floor until your waist is almost as high as your knees. If you do not feel the muscles working in your butt and the back of your thighs, slide your feet 1-2 inches (2.5-5 cm) farther away from your butt. Stay in this position for 3-5 seconds. Slowly lower your butt to the floor, and let your butt muscles relax. If this exercise is too easy, try doing it with your arms crossed over your chest. Belly crunches Do these steps 5-10 times in a row: Lie on your back on a firm bed or the floor with your legs stretched out. Bend your knees so they point up to the ceiling. Your feet should be flat on the floor. Cross your arms over your chest. Tip your chin a little bit toward your chest, but do not bend your neck. Tighten your belly muscles and slowly raise your chest just enough to lift your shoulder blades a tiny bit off the floor. Avoid raising your body higher than that because it can put too much stress on your lower back. Slowly lower your chest and your head to the floor. Back lifts Do these steps 5-10 times in a row: Lie on your belly (face-down) with your arms at your sides, and rest your forehead on the floor. Tighten the muscles in your legs and your butt. Slowly lift your chest off the floor while you keep your hips on the floor. Keep the back of your head in line with the curve in your back. Look at the floor while you do this. Stay in this position for 3-5 seconds. Slowly lower your chest and your face to the floor. Contact a doctor if: Your back pain gets a lot worse when you do an exercise. Your back pain does not get better within 2 hours after you exercise. If you have any of these problems, stop doing the exercises. Do not do them again unless your doctor says it is okay. Get help right away if: You have sudden, very bad back pain. If this  happens, stop doing the exercises. Do not do them again unless your doctor says it is okay. This information is not intended to replace advice given to you by your health care provider. Make sure you discuss any questions you have with your health care provider. Document Revised: 09/04/2020 Document Reviewed: 09/04/2020 Elsevier Patient Education  [employer].

## 2021-07-23 NOTE — Progress Notes (Signed)
Subjective:    Patient ID: Patty [name], female    DOB: [birth year], 66 y.o.   MRN: [id number]  HPI  Pt has low back pain for more than a month. In past had chronic low level back pain for years but this time/over past month daily moderate to severe pain. Pt states hurts to sit mostly. Less pain standing. No pain radiating to her legs. Pt is taking aleve or ibuprofen. But not both together.  No leg weakness or incontinence.    Hx of insomnia. Pt takes  xanax 1-2 tab at night.  Htn- blood pressure well controlled on current bp med regimen. On hctz 25 mg daily.  Hyperlipidemia- on crestor.   Review of Systems  Constitutional:  Negative for chills, fatigue and fever.  Respiratory:  Negative for chest tightness, shortness of breath and wheezing.   Cardiovascular:  Negative for chest pain and palpitations.  Gastrointestinal:  Negative for abdominal pain, constipation and diarrhea.  Musculoskeletal:  Positive for back pain.  Skin:  Negative for rash.  Neurological:  Negative for dizziness and headaches.  Hematological:  Negative for adenopathy. Does not bruise/bleed easily.  Psychiatric/Behavioral:  Negative for behavioral problems and confusion. The patient is not nervous/anxious.     Past Medical History:  Diagnosis Date   Anxiety    Barrett's esophagus    Cardiomegaly    Dyspareunia    Fatty pancreas    Gastric ulcer    GERD (gastroesophageal reflux disease)    Hx of colonic polyps    Hyperlipidemia    Hypertension    Osteopenia    Osteoporosis    Shingles    Sleep apnea    Thyroid nodule    Uterine leiomyoma      Social History   Socioeconomic History   Marital status: Married    Spouse name: Not on file   Number of children: Not on file   Years of education: Not on file   Highest education level: Not on file  Occupational History   Not on file  Tobacco Use   Smoking status: Never   Smokeless tobacco: Never  Vaping Use   Vaping Use: Never used   Substance and Sexual Activity   Alcohol use: No    Alcohol/week: 0.0 standard drinks   Drug use: No   Sexual activity: Yes    Partners: Male    Birth control/protection: Surgical    Comment: BTL  Other Topics Concern   Not on file  Social History Narrative   Not on file   Social Determinants of Health   Financial Resource Strain: Not on file  Food Insecurity: Not on file  Transportation Needs: Not on file  Physical Activity: Not on file  Stress: Not on file  Social Connections: Not on file  Intimate Partner Violence: Not on file    Past Surgical History:  Procedure Laterality Date   BIOPSY  07/04/2020   Procedure: BIOPSY;  Surgeon: Rush Landmark, Patty Nab., MD;  Location: [facility] ENDOSCOPY;  Service: Gastroenterology;;   BIOPSY THYROID     COLONOSCOPY W/ POLYPECTOMY     ESOPHAGOGASTRODUODENOSCOPY (EGD) WITH PROPOFOL N/A 07/04/2020   Procedure: ESOPHAGOGASTRODUODENOSCOPY (EGD) WITH PROPOFOL;  Surgeon: Patty [name]., MD;  Location: [place];  Service: Gastroenterology;  Laterality: N/A;   TUBAL LIGATION     UPPER ESOPHAGEAL ENDOSCOPIC ULTRASOUND (EUS) N/A 07/04/2020   Procedure: UPPER ESOPHAGEAL ENDOSCOPIC ULTRASOUND (EUS);  Surgeon: Patty [name]., MD;  Location: [place];  Service: Gastroenterology;  Laterality:  N/A;    Family History  Problem Relation Age of Onset   Hypertension Mother    Heart disease Mother    Heart disease Father    Hypertension Brother    Colon cancer Neg Hx    Esophageal cancer Neg Hx    Pancreatic cancer Neg Hx    Stomach cancer Neg Hx    Liver disease Neg Hx    Inflammatory bowel disease Neg Hx    Rectal cancer Neg Hx     No Known Allergies  Current Outpatient Medications on File Prior to Visit  Medication Sig Dispense Refill   ALPRAZolam (XANAX) 0.5 MG tablet 1-2 tab po q hs as needed insomnia 60 tablet 0   famotidine (PEPCID) 20 MG tablet TAKE 1 TABLET BY MOUTH TWICE A DAY 180 tablet 0   fenofibrate (TRICOR)  145 MG tablet Take 1 tablet (145 mg total) by mouth daily. 90 tablet 2   hydrochlorothiazide (HYDRODIURIL) 25 MG tablet Take 1 tablet (25 mg total) by mouth daily. 90 tablet 2   rosuvastatin (CRESTOR) 10 MG tablet Take 1 tablet (10 mg total) by mouth daily. 90 tablet 2   sucralfate (CARAFATE) 1 g tablet TAKE ONE TABLET FOUR TIMES PER DAY. IF UNABLE TO SWALLOW, YOU CAN CRUSH ONE TABLET AND DISSOLVE IN 10ML OF WARM WATER, MIX WELL TO CREATE SLURRY AND DRINK FOUR TIMES DAILY 120 tablet 0   traZODone (DESYREL) 50 MG tablet TAKE 1/2 TO 1 TABLET BY  MOUTH AT BEDTIME AS NEEDED  FOR SLEEP 30 tablet 11   acetaminophen (TYLENOL) 500 MG tablet Take 500-1,000 mg by mouth every 6 (six) hours as needed (for pain.).     bismuth subsalicylate (PEPTO BISMOL) 262 MG/15ML suspension Take 30 mLs by mouth every 6 (six) hours as needed for indigestion.     esomeprazole (NEXIUM) 20 MG packet Take 20 mg by mouth daily before breakfast. 30 each 3   esomeprazole (NEXIUM) 40 MG capsule Take 1 capsule (40 mg total) by mouth in the morning and at bedtime. 60 capsule 6   nitroGLYCERIN (NITROSTAT) 0.4 MG SL tablet Place 1 tablet (0.4 mg total) under the tongue every 5 (five) minutes as needed. 25 tablet 3   Current Facility-Administered Medications on File Prior to Visit  Medication Dose Route Frequency Provider Last Rate Last Admin   0.9 %  sodium chloride infusion  500 mL Intravenous Once [name], Patty Nab., MD       nitroGLYCERIN (NITROSTAT) SL tablet 0.4 mg  0.4 mg Sublingual Q5 min PRN Patty Heinz, MD   0.4 mg at 09/27/20 1550    BP 129/78    Pulse 90    Resp 18    Ht 5\' 1"  (1.549 m)    Wt 146 lb (66.2 kg)    LMP 07/06/2008    SpO2 96%    BMI 27.59 kg/m       Objective:   Physical Exam  General Appearance- Not in acute distress.    Chest and Lung Exam Auscultation: Breath sounds:-Normal. Clear even and unlabored. Adventitious sounds:- No Adventitious  sounds.  Cardiovascular Auscultation:Rythm - Regular, rate and rythm. Heart Sounds -Normal heart sounds.  Abdomen Inspection:-Inspection Normal.  Palpation/Perucssion: Palpation and Percussion of the abdomen reveal- Non Tender, No Rebound tenderness, No rigidity(Guarding) and No Palpable abdominal masses.  Liver:-Normal.  Spleen:- Normal.   Back Mid lumbar spine tenderness to palpation. Pain on straight leg lift. Pain on lateral movements and flexion/extension of the spine.  Lower  ext neurologic  L5-S1 sensation intact bilaterally. Normal patellar reflexes bilaterally. No foot drop bilaterally.       Assessment & Plan:   Patient Instructions  Chronic intermittent low-level back pain over the years.  Recent 1 month of more severe type pain.  We will get lumbar spine x-ray.  Advise discontinue ibuprofen and Aleve.  Will prescribe meloxicam 7.5 mg to use 1-2 tablet daily.  Also can use lidocaine over-the-counter salon pas patch to mid lower back.  Can try back stretching exercises.  If your pain is persisting into next week despite these measures then would refer to sports medicine.  Blood pressure well controlled.  Continue HCTZ 25 mg.  Insomnia and has been on Xanax 1 to 2 tablets at night.  You would mention that you tried something in [place] that your sister gave you.  You note that it seemed to work better and have less side effects.  If you could send me the name of that medication I can check and see if it is available in the [place].  Evaluate if it is a safer/better option than Xanax.  Hyperlipidemia-continue Crestor.  Follow-up date to be determined after imaging review and update on how you are doing early next week.  Mackie Pai, PA-C

## 2021-07-24 ENCOUNTER — Encounter: Payer: Self-pay | Admitting: Medical

## 2021-08-11 ENCOUNTER — Encounter: Payer: Self-pay | Admitting: Medical

## 2021-08-11 NOTE — Addendum Note (Signed)
Addended by: Anabel Halon on: 08/11/2021 04:59 PM   Modules accepted: Orders

## 2021-08-19 ENCOUNTER — Ambulatory Visit: Payer: Medicare Other | Admitting: Family Medicine

## 2021-08-19 ENCOUNTER — Other Ambulatory Visit: Payer: Self-pay | Admitting: Medical

## 2021-08-19 ENCOUNTER — Encounter: Payer: Self-pay | Admitting: Family Medicine

## 2021-08-19 VITALS — BP 110/78 | Ht 61.0 in | Wt 146.0 lb

## 2021-08-19 DIAGNOSIS — M47816 Spondylosis without myelopathy or radiculopathy, lumbar region: Secondary | ICD-10-CM | POA: Insufficient documentation

## 2021-08-19 DIAGNOSIS — M4316 Spondylolisthesis, lumbar region: Secondary | ICD-10-CM | POA: Diagnosis not present

## 2021-08-19 NOTE — Patient Instructions (Signed)
Nice to meet you Please continue heat  Please try the exercises  Please try physical therapy   Please send me a message in MyChart with any questions or updates.  Please see me back in 4 weeks.   --Dr. Raeford Razor

## 2021-08-19 NOTE — Assessment & Plan Note (Signed)
Acutely inflamed.  Has degenerative changes of the facet joints. -Counseled on home exercise therapy and supportive care. -Referral to physical therapy. -Could consider further imaging.

## 2021-08-19 NOTE — Assessment & Plan Note (Signed)
Acutely occurring.  Occurring at a couple of levels of the lumbar spine.  Stable from previous CT scans and newest x-ray. -Counseled on home exercise therapy and supportive care. -Counseled on compression. -Referral to physical therapy.

## 2021-08-19 NOTE — Progress Notes (Signed)
°  Patty [name] - 66 y.o. female MRN [id number]  Date of birth: 07/07/1955  SUBJECTIVE:  Including CC & ROS.  No chief complaint on file.   Patty [name] is a 66 y.o. female that is  Presenting with acute on chronic low back pain.  His symptoms are localized to the lower back.  Has been ongoing for pain over the months.  Has tried massage and medications.  Review of the note on 1/18 shows she was provided meloxicam and counseled on supportive care. Independent review of the lumbar spine x-ray from 1/18 shows mild degenerative facet changes.  Review of Systems See HPI   HISTORY: Past Medical, Surgical, Social, and Family History Reviewed & Updated per EMR.   Pertinent Historical Findings include:  Past Medical History:  Diagnosis Date   Anxiety    Barrett's esophagus    Cardiomegaly    Dyspareunia    Fatty pancreas    Gastric ulcer    GERD (gastroesophageal reflux disease)    Hx of colonic polyps    Hyperlipidemia    Hypertension    Osteopenia    Osteoporosis    Shingles    Sleep apnea    Thyroid nodule    Uterine leiomyoma     Past Surgical History:  Procedure Laterality Date   BIOPSY  07/04/2020   Procedure: BIOPSY;  Surgeon: Irving Copas., MD;  Location: [facility] ENDOSCOPY;  Service: Gastroenterology;;   BIOPSY THYROID     COLONOSCOPY W/ POLYPECTOMY     ESOPHAGOGASTRODUODENOSCOPY (EGD) WITH PROPOFOL N/A 07/04/2020   Procedure: ESOPHAGOGASTRODUODENOSCOPY (EGD) WITH PROPOFOL;  Surgeon: Irving Copas., MD;  Location: [place];  Service: Gastroenterology;  Laterality: N/A;   TUBAL LIGATION     UPPER ESOPHAGEAL ENDOSCOPIC ULTRASOUND (EUS) N/A 07/04/2020   Procedure: UPPER ESOPHAGEAL ENDOSCOPIC ULTRASOUND (EUS);  Surgeon: Irving Copas., MD;  Location: [place];  Service: Gastroenterology;  Laterality: N/A;     PHYSICAL EXAM:  VS: BP 110/78 (BP Location: Left Arm, Patient Position: Sitting)    Ht 5\' 1"  (1.549 m)    Wt 146 lb (66.2 kg)    LMP  07/06/2008    BMI 27.59 kg/m  Physical Exam Gen: NAD, alert, cooperative with exam, well-appearing MSK:  Neurovascularly intact       ASSESSMENT & PLAN:   Facet arthritis of lumbar region Acutely inflamed.  Has degenerative changes of the facet joints. -Counseled on home exercise therapy and supportive care. -Referral to physical therapy. -Could consider further imaging.  Anterolisthesis of lumbar spine Acutely occurring.  Occurring at a couple of levels of the lumbar spine.  Stable from previous CT scans and newest x-ray. -Counseled on home exercise therapy and supportive care. -Counseled on compression. -Referral to physical therapy.

## 2021-08-21 ENCOUNTER — Other Ambulatory Visit (HOSPITAL_COMMUNITY): Payer: Self-pay

## 2021-08-21 ENCOUNTER — Other Ambulatory Visit: Payer: Self-pay | Admitting: Medical

## 2021-08-21 DIAGNOSIS — G4700 Insomnia, unspecified: Secondary | ICD-10-CM

## 2021-08-21 DIAGNOSIS — Z79899 Other long term (current) drug therapy: Secondary | ICD-10-CM

## 2021-08-21 MED ORDER — ALPRAZOLAM 0.5 MG PO TABS
ORAL_TABLET | ORAL | 0 refills | Status: DC
Start: 2021-08-21 — End: 2021-09-13
  Filled 2021-08-21: qty 60, 60d supply, fill #0

## 2021-08-21 NOTE — Telephone Encounter (Addendum)
Requesting: Xanax 0.5MG  Contract: 03/25/21 UDS: 03/25/21 Last Visit: 07/23/2021 Next Visit: Not scheduled Last Refill: 06/11/21, #60, 0 refills  Please Advise   Rx sent in. Ask pt to follow up next month.  Mackie Pai, PA-C

## 2021-08-22 ENCOUNTER — Other Ambulatory Visit (HOSPITAL_COMMUNITY): Payer: Self-pay

## 2021-09-08 ENCOUNTER — Ambulatory Visit: Payer: Medicare Other | Attending: Family Medicine | Admitting: Physical Therapy

## 2021-09-08 ENCOUNTER — Other Ambulatory Visit: Payer: Self-pay

## 2021-09-08 DIAGNOSIS — M4316 Spondylolisthesis, lumbar region: Secondary | ICD-10-CM | POA: Diagnosis not present

## 2021-09-08 DIAGNOSIS — M6281 Muscle weakness (generalized): Secondary | ICD-10-CM | POA: Insufficient documentation

## 2021-09-08 DIAGNOSIS — R293 Abnormal posture: Secondary | ICD-10-CM | POA: Insufficient documentation

## 2021-09-08 DIAGNOSIS — G8929 Other chronic pain: Secondary | ICD-10-CM | POA: Insufficient documentation

## 2021-09-08 DIAGNOSIS — R252 Cramp and spasm: Secondary | ICD-10-CM | POA: Insufficient documentation

## 2021-09-08 DIAGNOSIS — M545 Low back pain, unspecified: Secondary | ICD-10-CM | POA: Insufficient documentation

## 2021-09-08 DIAGNOSIS — M47816 Spondylosis without myelopathy or radiculopathy, lumbar region: Secondary | ICD-10-CM | POA: Diagnosis not present

## 2021-09-08 NOTE — Therapy (Signed)
[facility] [address]  [place], [place], [address] Phone: [phone]   Fax:  [phone]  Physical Therapy Evaluation  Patient Details  Name: Patty [name] MRN: [id number] Date of Birth: 15-Jul-1955 Referring Provider (PT): Clearance Coots   Encounter Date: 09/08/2021   PT End of Session - 09/08/21 1403     Visit Number 1    Number of Visits 12    Date for PT Re-Evaluation 10/20/21    Authorization Type [employer] Medicare    Progress Note Due on Visit 10    PT Start Time 1403    PT Stop Time 1445    PT Time Calculation (min) 42 min    Activity Tolerance Patient tolerated treatment well    Behavior During Therapy [facility] for tasks assessed/performed             Past Medical History:  Diagnosis Date   Anxiety    Barrett's esophagus    Cardiomegaly    Dyspareunia    Fatty pancreas    Gastric ulcer    GERD (gastroesophageal reflux disease)    Hx of colonic polyps    Hyperlipidemia    Hypertension    Osteopenia    Osteoporosis    Shingles    Sleep apnea    Thyroid nodule    Uterine leiomyoma     Past Surgical History:  Procedure Laterality Date   BIOPSY  07/04/2020   Procedure: BIOPSY;  Surgeon: Irving Copas., MD;  Location: [facility] ENDOSCOPY;  Service: Gastroenterology;;   BIOPSY THYROID     COLONOSCOPY W/ POLYPECTOMY     ESOPHAGOGASTRODUODENOSCOPY (EGD) WITH PROPOFOL N/A 07/04/2020   Procedure: ESOPHAGOGASTRODUODENOSCOPY (EGD) WITH PROPOFOL;  Surgeon: Irving Copas., MD;  Location: [facility] ENDOSCOPY;  Service: Gastroenterology;  Laterality: N/A;   TUBAL LIGATION     UPPER ESOPHAGEAL ENDOSCOPIC ULTRASOUND (EUS) N/A 07/04/2020   Procedure: UPPER ESOPHAGEAL ENDOSCOPIC ULTRASOUND (EUS);  Surgeon: Irving Copas., MD;  Location: [place];  Service: Gastroenterology;  Laterality: N/A;    There were no vitals filed for this visit.    Subjective Assessment - 09/08/21 1417      Subjective Pt. reports history of chronic low back pain that worsened last 3 months, was given medication by PCP which didn't help, referred to    How long can you sit comfortably? 1-2 hours    How long can you stand comfortably? 1 hour    How long can you walk comfortably? 20 minutes    Diagnostic tests DG lumbar spine 07/23/21  1. Grade 1 retrolisthesis of L2 on L3 and L3 on L4, and grade 1  anterolisthesis of L5 on S1 with associated bilateral L5-S1 pars  defects, unchanged compared to the prior CT abdomen/pelvis from  04/04/2021.  2. Disc space narrowing and degenerative endplate change most  advanced at L2-L3.    Patient Stated Goals decrease pain    Currently in Pain? Yes    Pain Score 6     Pain Location Back    Pain Orientation Lower    Pain Descriptors / Indicators Constant;Aching;Sore    Pain Type Chronic pain;Acute pain    Pain Radiating Towards localized to low back    Pain Onset Other (comment)    Pain Frequency Constant    Aggravating Factors  bending foward to put on shoes, sit too long    Pain Relieving Factors salon pas patch, heat patch, cupping, massage  [facility] PT Assessment - 09/08/21 0001       Assessment   Medical Diagnosis M47.816 (ICD-10-CM) - Facet arthritis of lumbar region  M43.16 (ICD-10-CM) - Anterolisthesis of lumbar spine    Referring Provider (PT) Clearance Coots    Hand Dominance Right    Next MD Visit none scheduled    Prior Therapy no      Precautions   Precautions None      Restrictions   Weight Bearing Restrictions No      Balance Screen   Has the patient fallen in the past 6 months No    Has the patient had a decrease in activity level because of a fear of falling?  No    Is the patient reluctant to leave their home because of a fear of falling?  No      Home Ecologist residence    Living Arrangements Spouse/significant other    Type of [place] to enter     Entrance Stairs-Number of Steps 3    [place] Two level    Alternate Level Stairs-Number of Steps 1 flight    Alternate Level Stairs-Rails Can reach both      Prior Function   Level of Independence Independent    Vocation Full time employment    Haematologist, Secondary school teacher    Leisure walk, watch TV      Cognition   Overall Cognitive Status Within Functional Limits for tasks assessed      Observation/Other Assessments   Observations enters independently, no apparent distress    Focus on Therapeutic Outcomes (FOTO)  lumbar: 56%, predicted outcome 66% after 10 visits      Posture/Postural Control   Posture/Postural Control Postural limitations    Postural Limitations Rounded Shoulders;Forward head      ROM / Strength   AROM / PROM / Strength AROM;Strength      AROM   Overall AROM  Within functional limits for tasks performed    Overall AROM Comments full lumbar ROM all direction, reports constant pain and stretch with movements, but no provocative pain, even with overpressure      Strength   Overall Strength Within functional limits for tasks performed    Overall Strength Comments tested in sitting    Strength Assessment Site Hip;Ankle;Knee    Right/Left Hip Right;Left    Right Hip Flexion 4+/5   increased LBP   Right Hip ABduction 5/5    Right Hip ADduction 5/5    Left Hip Flexion 4+/5   increased LBP   Left Hip ABduction 5/5    Left Hip ADduction 5/5    Right/Left Knee Right;Left    Right Knee Flexion 5/5    Right Knee Extension 5/5    Left Knee Flexion 5/5    Left Knee Extension 5/5    Right/Left Ankle Right;Left    Right Ankle Dorsiflexion 5/5    Left Ankle Dorsiflexion 5/5      Flexibility   Soft Tissue Assessment /Muscle Length yes    Hamstrings tightness bil, reports increased LBP      Palpation   Spinal mobility normal mobility.  Tenderness with PA mob L2.  Good hip mobility for IR and ER.    SI assessment  no tenderness     Palpation comment noted tightness in lumbar paraspinals, glutes but no tenderness      Special Tests   Other special tests  reports increased LBP with SLR bilaterally, increased LBP with FABER bil.  Negative scouur test bil.      Ambulation/Gait   Gait Comments Independent, no deviation or device.                        Objective measurements completed on examination: See above findings.       [place] Adult PT Treatment/Exercise - 09/08/21 0001       Self-Care   Self-Care Other Self-Care Comments    Other Self-Care Comments  see patient education      Exercises   Exercises Lumbar      Lumbar Exercises: Stretches   Passive Hamstring Stretch Right;Left;2 reps;20 seconds    Passive Hamstring Stretch Limitations seated HS stretch, demo and VC for technique.    Prone on Elbows Stretch 1 rep;60 seconds    Prone on Elbows Stretch Limitations no increase in pain, tolerated well    Press Ups 10 reps    Press Ups Limitations cues to perform to tolerance, no increase in pain                     PT Education - 09/08/21 1557     Education Details education on findings, plan of care including DN (very interested), initial HEP, also discussed the "no pain no gain" view of stretching/exercises is not necessary, focus on feeling stretch but not to point of pain.    Person(s) Educated Patient    Methods Explanation;Demonstration;Handout;Verbal cues    Comprehension Verbalized understanding;Returned demonstration              PT Short Term Goals - 09/08/21 1559       PT SHORT TERM GOAL #1   Title Ind with initial HEP.    Time 2    Period Weeks    Status New    Target Date 09/22/21               PT Long Term Goals - 09/08/21 1559       PT LONG TERM GOAL #1   Title Independent with progressed HEP for core strengthening to improve outcomes and carryover    Time 6    Period Weeks    Status New    Target Date 10/20/21      PT LONG TERM GOAL  #2   Title Pt. will report 75% improvement in low back pain.    Baseline constant 6/10    Time 6    Period Weeks    Status New    Target Date 10/20/21      PT LONG TERM GOAL #3   Title Pt. will demonstrate improved functional ability by scoring at least 66% on FOTO    Baseline 56% - severe limitations due to LBP    Time 6    Period Weeks    Status New    Target Date 10/20/21      PT LONG TERM GOAL #4   Title Pt. will be able to walk for 30 min without increased LBP for exercise.    Baseline enjoys walking but back pain limits    Time 6    Period Weeks    Status New    Target Date 10/20/21                    Plan - 09/08/21 1547     Clinical Impression Statement "Collie Siad" Starzyk is a 66 year old  female referred for acute on chronic LBP.  She reports several years of LBP that worsened over last 3 months.  FOTO today was only 56% functional ability due to LBP.  She has tried massage, cupping, and stretches given by MD without relief.  She demonstrated good lumbar ROM today, but tightness in hamstrings.  Reviewed her stretches as exercises, she believed that the interventions had to hurt to be effective, educated htat in PT we would be focusing on movements to relieve pain and stretch without increased pain, as to not aggravate and increase pain further.  Started today with prone press-ups and seated hamstring stretch, after form corrected, she was able to do this stretch without pain in her LB.  She would benefit from skilled physical therapy to decrease LBP, improve activity tolerance, and improve core strength.    Personal Factors and Comorbidities Age;Behavior Pattern;Time since onset of injury/illness/exacerbation;Comorbidity 2    Comorbidities HTN, arthritis, GERD    Examination-Activity Limitations Reach Overhead;Stairs;Stand;Bend;Hygiene/Grooming;Sit;Lift;Sleep;Carry;Locomotion Level    Examination-Participation Restrictions Cleaning;Meal Prep;Yard Work;Community  Activity;Occupation;Laundry    Stability/Clinical Decision Making Stable/Uncomplicated    Clinical Decision Making Low    Rehab Potential Good    PT Frequency 2x / week    PT Duration 6 weeks    PT Treatment/Interventions ADLs/Self Care Home Management;Cryotherapy;Electrical Stimulation;Moist Heat;Traction;Ultrasound;Therapeutic activities;Therapeutic exercise;Neuromuscular re-education;Patient/family education;Manual techniques;Dry needling;Taping;Spinal Manipulations;Joint Manipulations    PT Next Visit Plan start core stabilization exercises, manual therapy/DN to gluts/lumbar multifidi, modalities PRN    PT Home Exercise Plan Access Code: [email or web address]    Consulted and Agree with Plan of Care Patient             Patient will benefit from skilled therapeutic intervention in order to improve the following deficits and impairments:  Decreased range of motion, Decreased activity tolerance, Decreased strength, Increased fascial restricitons, Pain, Improper body mechanics, Increased muscle spasms, Impaired flexibility  Visit Diagnosis: Chronic bilateral low back pain without sciatica  Cramp and spasm  Muscle weakness (generalized)  Abnormal posture     Problem List Patient Active Problem List   Diagnosis Date Noted   Facet arthritis of lumbar region 08/19/2021   Anterolisthesis of lumbar spine 08/19/2021   Gastric ulcer without hemorrhage or perforation 10/14/2020   Elevated random blood glucose level 09/18/2020   Myalgia due to statin 07/24/2020   Hypertriglyceridemia 07/23/2020   Erosive gastritis 07/23/2020   Elevated liver enzymes 06/17/2020   Elevated fasting glucose 06/17/2020   Needle exposure, initial encounter 06/14/2020   Need for hepatitis C screening test 06/14/2020   Need for hepatitis B screening test 06/14/2020   Encounter for screening for human immunodeficiency virus (HIV) 06/14/2020   Dysuria 05/29/2020   Primary insomnia 05/29/2020   History of COVID-19  04/23/2020   Serum albumin decreased 04/23/2020   Neck pain 04/23/2020   Pancreatic mass 04/03/2020   Aortic atherosclerosis ([place]) 04/03/2020   History of acute respiratory failure 03/18/2020   Pneumonia due to COVID-19 virus 03/06/2020   Primary hypertension 03/06/2020   Dyslipidemia (high LDL; low HDL) 03/06/2020   Thrombocytopenia ([place]) 03/06/2020   Acute respiratory failure due to COVID-19 ([place]) 03/05/2020   NONTOXIC MULTINODULAR GOITER 04/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

## 2021-09-08 NOTE — Patient Instructions (Addendum)
Access Code: [email or web address] ?URL: [email or web address] ?Date: 09/08/2021 ?Prepared by: Glenetta Hew ? ?Exercises ?Prone Press Up On Elbows - 3 x daily - 7 x weekly - 1 sets - 10 reps ?Prone Press Up - 3 x daily - 7 x weekly - 1 sets - 10 reps ?Seated Hamstring Stretch - 2 x daily - 7 x weekly - 1 sets - 3 reps - 15-30 sec hold ? ? ?Trigger Point Dry Needling ? ?What is Trigger Point Dry Needling (DN)? ?DN is a physical therapy technique used to treat muscle pain and dysfunction. Specifically, DN helps deactivate muscle trigger points (muscle knots).  ?A thin filiform needle is used to penetrate the skin and stimulate the underlying trigger point. The goal is for a local twitch response (LTR) to occur and for the trigger point to relax. No medication of any kind is injected during the procedure.  ? ?What Does Trigger Point Dry Needling Feel Like?  ?The procedure feels different for each individual patient. Some patients report that they do not actually feel the needle enter the skin and overall the process is not painful. Very mild bleeding may occur. However, many patients feel a deep cramping in the muscle in which the needle was inserted. This is the local twitch response.  ? ?How Will I feel after the treatment? ?Soreness is normal, and the onset of soreness may not occur for a few hours. Typically this soreness does not last longer than two days.  ?Bruising is uncommon, however; ice can be used to decrease any possible bruising.  ?In rare cases feeling tired or nauseous after the treatment is normal. In addition, your symptoms may get worse before they get better, this period will typically not last longer than 24 hours.  ? ?What Can I do After My Treatment? ?Increase your hydration by drinking more water for the next 24 hours. ?You may place ice or heat on the areas treated that have become sore, however, do not use heat on inflamed or bruised areas. Heat often brings more relief post  needling. ?You can continue your regular activities, but vigorous activity is not recommended initially after the treatment for 24 hours. ?DN is best combined with other physical therapy such as strengthening, stretching, and other therapies.  ? ?

## 2021-09-10 ENCOUNTER — Ambulatory Visit: Payer: Medicare Other

## 2021-09-13 ENCOUNTER — Other Ambulatory Visit: Payer: Self-pay | Admitting: Medical

## 2021-09-13 DIAGNOSIS — G4700 Insomnia, unspecified: Secondary | ICD-10-CM

## 2021-09-13 DIAGNOSIS — Z79899 Other long term (current) drug therapy: Secondary | ICD-10-CM

## 2021-09-15 ENCOUNTER — Encounter: Payer: Self-pay | Admitting: Physical Therapy

## 2021-09-15 ENCOUNTER — Other Ambulatory Visit: Payer: Self-pay | Admitting: Medical

## 2021-09-15 ENCOUNTER — Ambulatory Visit: Payer: Medicare Other | Admitting: Physical Therapy

## 2021-09-15 ENCOUNTER — Other Ambulatory Visit: Payer: Self-pay

## 2021-09-15 ENCOUNTER — Other Ambulatory Visit (HOSPITAL_COMMUNITY): Payer: Self-pay

## 2021-09-15 DIAGNOSIS — M6281 Muscle weakness (generalized): Secondary | ICD-10-CM

## 2021-09-15 DIAGNOSIS — R252 Cramp and spasm: Secondary | ICD-10-CM

## 2021-09-15 DIAGNOSIS — R293 Abnormal posture: Secondary | ICD-10-CM

## 2021-09-15 DIAGNOSIS — M545 Low back pain, unspecified: Secondary | ICD-10-CM | POA: Diagnosis not present

## 2021-09-15 DIAGNOSIS — G8929 Other chronic pain: Secondary | ICD-10-CM

## 2021-09-15 MED ORDER — FAMOTIDINE 20 MG PO TABS
20.0000 mg | ORAL_TABLET | Freq: Two times a day (BID) | ORAL | 0 refills | Status: DC
  Filled 2021-09-15 – 2021-12-06 (×3): qty 180, 90d supply, fill #0
  Filled 2021-12-15: qty 120, 60d supply, fill #0

## 2021-09-15 MED ORDER — ALPRAZOLAM 0.5 MG PO TABS
ORAL_TABLET | ORAL | 0 refills | Status: DC
  Filled 2021-09-15: qty 60, fill #0
  Filled 2021-10-15: qty 60, 30d supply, fill #0

## 2021-09-15 NOTE — Telephone Encounter (Addendum)
Requesting: xanax ?Contract:04/07/2021 ?UDS:03/25/2021 ?Last Visit:07/23/21 ?Next Visit:N/A ?Last Refill:08/21/21 ? ?Please Advise  ?Rx refill sent to pharmacy. But early so advise pt to wait 30 days from prior refill to fill. ?Thanks, ? ?Mackie Pai, PA-C  ?

## 2021-09-15 NOTE — Patient Instructions (Addendum)
Access Code: [email or web address] ?URL: [email or web address] ?Date: 09/15/2021 ?Prepared by: Glenetta Hew ? ?Exercises ?Prone Press Up On Elbows - 3 x daily - 7 x weekly - 1 sets - 10 reps ?Prone Press Up - 3 x daily - 7 x weekly - 1 sets - 10 reps ?Seated Hamstring Stretch - 2 x daily - 7 x weekly - 1 sets - 3 reps - 15-30 sec hold ?Supine Lower Trunk Rotation - 2 x daily - 7 x weekly - 2 sets - 10 reps ?Supine Posterior Pelvic Tilt - 2 x daily - 7 x weekly - 2 sets - 10 reps - 5-10 sec hold ?Supine Bridge - 2 x daily - 7 x weekly - 2 sets - 10 reps ?Supine Hamstring Stretch - 2 x daily - 7 x weekly - 1 sets - 10 reps ?Supine Figure 4 Piriformis Stretch - 1 x daily - 7 x weekly - 1 sets - 3 reps - 15 sec hold ?Cat Cow - 2 x daily - 7 x weekly - 1 sets - 10 reps ?Child's Pose Stretch - 1 x daily - 7 x weekly - 1 sets - 3 reps - 15 sec hold ? ? ?Trigger Point Dry Needling ? ?What is Trigger Point Dry Needling (DN)? ?DN is a physical therapy technique used to treat muscle pain and dysfunction. Specifically, DN helps deactivate muscle trigger points (muscle knots).  ?A thin filiform needle is used to penetrate the skin and stimulate the underlying trigger point. The goal is for a local twitch response (LTR) to occur and for the trigger point to relax. No medication of any kind is injected during the procedure.  ? ?What Does Trigger Point Dry Needling Feel Like?  ?The procedure feels different for each individual patient. Some patients report that they do not actually feel the needle enter the skin and overall the process is not painful. Very mild bleeding may occur. However, many patients feel a deep cramping in the muscle in which the needle was inserted. This is the local twitch response.  ? ?How Will I feel after the treatment? ?Soreness is normal, and the onset of soreness may not occur for a few hours. Typically this soreness does not last longer than two days.  ?Bruising is uncommon, however; ice can be  used to decrease any possible bruising.  ?In rare cases feeling tired or nauseous after the treatment is normal. In addition, your symptoms may get worse before they get better, this period will typically not last longer than 24 hours.  ? ?What Can I do After My Treatment? ?Increase your hydration by drinking more water for the next 24 hours. ?You may place ice or heat on the areas treated that have become sore, however, do not use heat on inflamed or bruised areas. Heat often brings more relief post needling. ?You can continue your regular activities, but vigorous activity is not recommended initially after the treatment for 24 hours. ?DN is best combined with other physical therapy such as strengthening, stretching, and other therapies.  ? ?

## 2021-09-15 NOTE — Therapy (Addendum)
PHYSICAL THERAPY DISCHARGE SUMMARY  Visits from Start of Care: 2  Current functional level related to goals / functional outcomes: See note below   Remaining deficits: See note below   Education / Equipment: HEP  Plan: Patient agrees to discharge.  Patient is being discharged due to patient request.    [name], PT, DPT 10/06/2021, 9:10AM    [facility] [address]  [address] [place], [place], [address] Phone: [phone]   Fax:  [phone]  Physical Therapy Treatment  Patient Details  Name: Patty [name] MRN: [id number] Date of Birth: 10/29/1955 Referring Provider (PT): Clare Gandy   Encounter Date: 09/15/2021   PT End of Session - 09/15/21 1536     Visit Number 2    Number of Visits 12    Date for PT Re-Evaluation 10/20/21    Authorization Type [employer] Medicare    Progress Note Due on Visit 10    PT Start Time 1534    PT Stop Time 1615    PT Time Calculation (min) 41 min    Activity Tolerance Patient tolerated treatment well    Behavior During Therapy [facility] for tasks assessed/performed             Past Medical History:  Diagnosis Date   Anxiety    Barrett's esophagus    Cardiomegaly    Dyspareunia    Fatty pancreas    Gastric ulcer    GERD (gastroesophageal reflux disease)    Hx of colonic polyps    Hyperlipidemia    Hypertension    Osteopenia    Osteoporosis    Shingles    Sleep apnea    Thyroid nodule    Uterine leiomyoma     Past Surgical History:  Procedure Laterality Date   BIOPSY  07/04/2020   Procedure: BIOPSY;  Surgeon: Lemar Lofty., MD;  Location: [facility] ENDOSCOPY;  Service: Gastroenterology;;   BIOPSY THYROID     COLONOSCOPY W/ POLYPECTOMY     ESOPHAGOGASTRODUODENOSCOPY (EGD) WITH PROPOFOL N/A 07/04/2020   Procedure: ESOPHAGOGASTRODUODENOSCOPY (EGD) WITH PROPOFOL;  Surgeon: Lemar Lofty., MD;  Location: [facility] ENDOSCOPY;  Service: Gastroenterology;   Laterality: N/A;   TUBAL LIGATION     UPPER ESOPHAGEAL ENDOSCOPIC ULTRASOUND (EUS) N/A 07/04/2020   Procedure: UPPER ESOPHAGEAL ENDOSCOPIC ULTRASOUND (EUS);  Surgeon: Lemar Lofty., MD;  Location: [facility] ENDOSCOPY;  Service: Gastroenterology;  Laterality: N/A;    There were no vitals filed for this visit.   Subjective Assessment - 09/15/21 1536     Subjective Pt. reports had to take medicine this morning.  Thinks its all the exercise she is doing.  Arrived wearing back brace recommended by Dr. Jordan Likes.    How long can you sit comfortably? 1-2 hours    How long can you stand comfortably? 1 hour    How long can you walk comfortably? 20 minutes    Diagnostic tests DG lumbar spine 07/23/21  1. Grade 1 retrolisthesis of L2 on L3 and L3 on L4, and grade 1  anterolisthesis of L5 on S1 with associated bilateral L5-S1 pars  defects, unchanged compared to the prior CT abdomen/pelvis from  04/04/2021.  2. Disc space narrowing and degenerative endplate change most  advanced at L2-L3.    Patient Stated Goals decrease pain    Currently in Pain? Yes    Pain Score 7     Pain Location Back    Pain Orientation Lower    Pain Onset  Other (comment)                               [facility] Adult PT Treatment/Exercise - 09/15/21 0001       Lumbar Exercises: Stretches   Passive Hamstring Stretch Right;Left;2 reps;20 seconds    Passive Hamstring Stretch Limitations seated HS stretch, demo and VC for technique.    Lower Trunk Rotation 5 reps    Lower Trunk Rotation Limitations no pain    Pelvic Tilt 20 reps    Pelvic Tilt Limitations 1st set just AROM, second set increased hold time x 5 sec    Press Ups 10 reps    Press Ups Limitations cues to perform to tolerance, no increase in pain      Lumbar Exercises: Aerobic   Nustep L4 x 4 min   complained of increased knee discomfort     Lumbar Exercises: Supine   Bridge 20 reps    Bridge Limitations with TrA contraction      Lumbar  Exercises: Prone   Straight Leg Raise 10 reps    Straight Leg Raises Limitations bil      Lumbar Exercises: Quadruped   Madcat/Old Horse 10 reps    Straight Leg Raise 10 reps    Straight Leg Raises Limitations reported increased wrist pain, did not tolerate well for this reason    Other Quadruped Lumbar Exercises child pose stretch      Manual Therapy   Manual Therapy Joint mobilization;Soft tissue mobilization;Other (comment)    Manual therapy comments to decrease pain and improve vertebral mobility    Joint Mobilization PA/UPA mobs lumbar spine grade 2-3    Soft tissue mobilization STM to lumbar paraspinals    Other Manual Therapy skilled palpation and monitoring with dry needling.              Trigger Point Dry Needling - 09/15/21 0001     Consent Given? Yes    Education Handout Provided Yes    Muscles Treated Back/Hip Lumbar multifidi    Dry Needling Comments bil L2-L5    Lumbar multifidi Response Twitch response elicited;Palpable increased muscle length                   PT Education - 09/15/21 1811     Education Details review and progression of HEP, education on goals of exercises and to not push into pain, Access Code: [email or web address], answered questions on DN.    Person(s) Educated Patient    Methods Explanation;Demonstration;Handout;Verbal cues    Comprehension Verbalized understanding;Returned demonstration              PT Short Term Goals - 09/15/21 1816       PT SHORT TERM GOAL #1   Title Ind with initial HEP.    Time 2    Period Weeks    Status On-going    Target Date 09/22/21               PT Long Term Goals - 09/15/21 1538       PT LONG TERM GOAL #1   Title Independent with progressed HEP for core strengthening to improve outcomes and carryover    Time 6    Period Weeks    Status On-going    Target Date 10/20/21      PT LONG TERM GOAL #2   Title Pt. will report 75% improvement in low back pain.    Baseline  constant 6/10     Time 6    Period Weeks    Status On-going    Target Date 10/20/21      PT LONG TERM GOAL #3   Title Pt. will demonstrate improved functional ability by scoring at least 66% on FOTO    Baseline 56% - severe limitations due to LBP    Time 6    Period Weeks    Status On-going    Target Date 10/20/21      PT LONG TERM GOAL #4   Title Pt. will be able to walk for 30 min without increased LBP for exercise.    Baseline enjoys walking but back pain limits    Time 6    Period Weeks    Status On-going    Target Date 10/20/21                   Plan - 09/15/21 1813     Clinical Impression Statement Pt. needed a lot of cuing with exercises today to both slow exercises down, and not go into positions that are painful.  Continued to educate that "no pain no gain" can be counterproductive with back pain, and that strengthening core is important, which was focus of todays exercises.  At end of session performed manual therapy to low back, including dry needling to lumbar multifidi, which she found very helpful and reported decreased pain afterwards.    Personal Factors and Comorbidities Age;Behavior Pattern;Time since onset of injury/illness/exacerbation;Comorbidity 2    Comorbidities HTN, arthritis, GERD    Examination-Activity Limitations Reach Overhead;Stairs;Stand;Bend;Hygiene/Grooming;Sit;Lift;Sleep;Carry;Locomotion Level    Examination-Participation Restrictions Cleaning;Meal Prep;Yard Work;Community Activity;Occupation;Laundry    Stability/Clinical Decision Making Stable/Uncomplicated    Rehab Potential Good    PT Frequency 2x / week    PT Duration 6 weeks    PT Treatment/Interventions ADLs/Self Care Home Management;Cryotherapy;Electrical Stimulation;Moist Heat;Traction;Ultrasound;Therapeutic activities;Therapeutic exercise;Neuromuscular re-education;Patient/family education;Manual techniques;Dry needling;Taping;Spinal Manipulations;Joint Manipulations    PT Next Visit Plan start  core stabilization exercises, manual therapy/DN to gluts/lumbar multifidi, modalities PRN    PT Home Exercise Plan Access Code: [email or web address]    Consulted and Agree with Plan of Care Patient             Patient will benefit from skilled therapeutic intervention in order to improve the following deficits and impairments:  Decreased range of motion, Decreased activity tolerance, Decreased strength, Increased fascial restricitons, Pain, Improper body mechanics, Increased muscle spasms, Impaired flexibility  Visit Diagnosis: Chronic bilateral low back pain without sciatica  Cramp and spasm  Muscle weakness (generalized)  Abnormal posture     Problem List Patient Active Problem List   Diagnosis Date Noted   Facet arthritis of lumbar region 08/19/2021   Anterolisthesis of lumbar spine 08/19/2021   Gastric ulcer without hemorrhage or perforation 10/14/2020   Elevated random blood glucose level 09/18/2020   Myalgia due to statin 07/24/2020   Hypertriglyceridemia 07/23/2020   Erosive gastritis 07/23/2020   Elevated liver enzymes 06/17/2020   Elevated fasting glucose 06/17/2020   Needle exposure, initial encounter 06/14/2020   Need for hepatitis C screening test 06/14/2020   Need for hepatitis B screening test 06/14/2020   Encounter for screening for human immunodeficiency virus (HIV) 06/14/2020   Dysuria 05/29/2020   Primary insomnia 05/29/2020   History of COVID-19 04/23/2020   Serum albumin decreased 04/23/2020   Neck pain 04/23/2020   Pancreatic mass 04/03/2020   Aortic atherosclerosis (HCC) 04/03/2020   History of acute respiratory failure 03/18/2020   Pneumonia  due to COVID-19 virus 03/06/2020   Primary hypertension 03/06/2020   Dyslipidemia (high LDL; low HDL) 03/06/2020   Thrombocytopenia (HCC) 03/06/2020   Acute respiratory failure due to COVID-19 (HCC) 03/05/2020   NONTOXIC MULTINODULAR GOITER 04/18/2009   SOMATIZATION DISORDER 04/18/2009   ARTHRITIS 04/18/2009    ABDOMINAL PAIN, GENERALIZED 04/18/2009   GERD 04/11/2009   ABDOMINAL PAIN, EPIGASTRIC 12/19/2008   ABDOMINAL PAIN, SUPRAPUBIC 12/19/2008   COLD SORE 05/24/2007   FIBROIDS, UTERUS 05/24/2007   CHEST PAIN, ATYPICAL 05/24/2007    [name], PT, DPT  09/15/2021, 6:21 PM  [facility] [address]  [address] [place], [place], [address] Phone: [phone]   Fax:  [phone]  Name: SEINI VOLLMAN MRN: [id number] Date of Birth: January 19, 1956

## 2021-09-22 ENCOUNTER — Ambulatory Visit: Payer: Medicare Other

## 2021-09-24 ENCOUNTER — Other Ambulatory Visit (HOSPITAL_COMMUNITY): Payer: Self-pay

## 2021-09-24 ENCOUNTER — Encounter: Payer: Medicare Other | Admitting: Physical Therapy

## 2021-09-29 ENCOUNTER — Encounter: Payer: Medicare Other | Admitting: Physical Therapy

## 2021-10-06 ENCOUNTER — Encounter: Payer: Medicare Other | Admitting: Physical Therapy

## 2021-10-15 ENCOUNTER — Other Ambulatory Visit: Payer: Self-pay | Admitting: Medical

## 2021-10-15 ENCOUNTER — Other Ambulatory Visit (HOSPITAL_COMMUNITY): Payer: Self-pay

## 2021-10-15 ENCOUNTER — Encounter: Payer: Self-pay | Admitting: Medical

## 2021-10-15 MED ORDER — MELOXICAM 7.5 MG PO TABS
7.5000 mg | ORAL_TABLET | Freq: Every day | ORAL | 0 refills | Status: DC
  Filled 2021-10-15 – 2021-12-15 (×3): qty 30, 30d supply, fill #0

## 2021-10-16 ENCOUNTER — Telehealth: Payer: Self-pay | Admitting: Medical

## 2021-10-16 DIAGNOSIS — G4700 Insomnia, unspecified: Secondary | ICD-10-CM

## 2021-10-16 DIAGNOSIS — Z79899 Other long term (current) drug therapy: Secondary | ICD-10-CM

## 2021-10-16 MED ORDER — ALPRAZOLAM 0.5 MG PO TABS: ORAL_TABLET | ORAL | 0 refills | Status: DC

## 2021-10-16 NOTE — Telephone Encounter (Signed)
Rx refill xanax sent to pharmacy. ?

## 2021-10-16 NOTE — Telephone Encounter (Signed)
Contract - 03/2021 ?

## 2021-10-18 ENCOUNTER — Other Ambulatory Visit (HOSPITAL_COMMUNITY): Payer: Self-pay

## 2021-10-20 ENCOUNTER — Other Ambulatory Visit (HOSPITAL_COMMUNITY): Payer: Self-pay

## 2021-10-23 ENCOUNTER — Other Ambulatory Visit (HOSPITAL_COMMUNITY): Payer: Self-pay

## 2021-10-29 ENCOUNTER — Ambulatory Visit: Payer: Medicare Other | Admitting: Medical

## 2021-10-29 VITALS — BP 130/80 | HR 83 | Resp 18 | Ht 61.0 in | Wt 144.6 lb

## 2021-10-29 DIAGNOSIS — G4700 Insomnia, unspecified: Secondary | ICD-10-CM

## 2021-10-29 DIAGNOSIS — R5383 Other fatigue: Secondary | ICD-10-CM | POA: Diagnosis not present

## 2021-10-29 DIAGNOSIS — R739 Hyperglycemia, unspecified: Secondary | ICD-10-CM

## 2021-10-29 LAB — COMPREHENSIVE METABOLIC PANEL
ALT: 20 U/L (ref 0–35)
AST: 17 U/L (ref 0–37)
Albumin: 4.9 g/dL (ref 3.5–5.2)
Alkaline Phosphatase: 77 U/L (ref 39–117)
BUN: 21 mg/dL (ref 6–23)
CO2: 30 mEq/L (ref 19–32)
Calcium: 10.3 mg/dL (ref 8.4–10.5)
Chloride: 102 mEq/L (ref 96–112)
Creatinine, Ser: 0.85 mg/dL (ref 0.40–1.20)
GFR: 71.28 mL/min (ref >60.00)
Glucose, Bld: 120 mg/dL — ABNORMAL HIGH (ref 70–99)
Potassium: 4.1 mEq/L (ref 3.5–5.1)
Sodium: 141 mEq/L (ref 135–145)
Total Bilirubin: 0.6 mg/dL (ref 0.2–1.2)
Total Protein: 7.3 g/dL (ref 6.0–8.3)

## 2021-10-29 LAB — CBC WITH DIFFERENTIAL/PLATELET
Basophils Absolute: 0.1 10*3/uL (ref 0.0–0.1)
Basophils Relative: 1.2 % (ref 0.0–3.0)
Eosinophils Absolute: 0.2 10*3/uL (ref 0.0–0.7)
Eosinophils Relative: 2.9 % (ref 0.0–5.0)
HCT: 41.6 % (ref 36.0–46.0)
Hemoglobin: 13.6 g/dL (ref 12.0–15.0)
Lymphocytes Relative: 32.9 % (ref 12.0–46.0)
Lymphs Abs: 2.1 10*3/uL (ref 0.7–4.0)
MCHC: 32.8 g/dL (ref 30.0–36.0)
MCV: 90.1 fl (ref 78.0–100.0)
Monocytes Absolute: 0.7 10*3/uL (ref 0.1–1.0)
Monocytes Relative: 11 % (ref 3.0–12.0)
Neutro Abs: 3.3 10*3/uL (ref 1.4–7.7)
Neutrophils Relative %: 52 % (ref 43.0–77.0)
Platelets: 299 10*3/uL (ref 150.0–400.0)
RBC: 4.61 Mil/uL (ref 3.87–5.11)
RDW: 12.9 % (ref 11.5–15.5)
WBC: 6.3 10*3/uL (ref 4.0–10.5)

## 2021-10-29 LAB — TSH: TSH: 2.23 u[IU]/mL (ref 0.35–5.50)

## 2021-10-29 LAB — VITAMIN B12: Vitamin B-12: 822 pg/mL (ref 211–911)

## 2021-10-29 LAB — T4, FREE: Free T4: 1.89 ng/dL — ABNORMAL HIGH (ref 0.60–1.60)

## 2021-10-29 LAB — HEMOGLOBIN A1C: Hgb A1c MFr Bld: 5.9 % (ref 4.6–6.5)

## 2021-10-29 MED ORDER — ESZOPICLONE 2 MG PO TABS: 2.0000 mg | ORAL_TABLET | Freq: Every evening | ORAL | 0 refills | Status: DC | PRN

## 2021-10-29 NOTE — Progress Notes (Addendum)
? ?Subjective:  ? ? Patient ID: CAYLEA FORONDA, female    DOB: May 28, 1956, 66 y.o.   MRN: [id number] ? ?HPI ? ?Pt in for follow up. ? ?Pt states she can't sleep recently despite using her xanax. She had been on xanax for 20 years. Describes that she used xanax for insominia and for some stress/anxiety. States mind overactive. ? ?Pt thinks needs to be on lunesta. Her sister is on this.  ? ?I had prescribed trazadone and she states did not help. If sleeps she would feel groggy next day. ? ?Pt got lunesta 2 mg prescription in [place]. She states slept better. ? ?Pt states when she tried [place] she did not take xanax.  ? ?Review of Systems  ?Constitutional:  Positive for fatigue. Negative for chills and fever.  ?Respiratory:  Negative for choking, shortness of breath and wheezing.   ?Cardiovascular:  Negative for chest pain and palpitations.  ?Gastrointestinal:  Negative for abdominal pain.  ?Endocrine: Positive for polydipsia and polyphagia.  ?Musculoskeletal:  Negative for back pain and joint swelling.  ?Neurological:  Negative for dizziness and weakness.  ?Hematological:  Negative for adenopathy. Does not bruise/bleed easily.  ?Psychiatric/Behavioral:  Positive for sleep disturbance. Negative for behavioral problems and dysphoric mood.   ?     Probable stress/anxiety component.  ? ? ?Past Medical History:  ?Diagnosis Date  ? Anxiety   ? Barrett's esophagus   ? Cardiomegaly   ? Dyspareunia   ? Fatty pancreas   ? Gastric ulcer   ? GERD (gastroesophageal reflux disease)   ? Hx of colonic polyps   ? Hyperlipidemia   ? Hypertension   ? Osteopenia   ? Osteoporosis   ? Shingles   ? Sleep apnea   ? Thyroid nodule   ? Uterine leiomyoma   ? ?  ?Social History  ? ?Socioeconomic History  ? Marital status: Married  ?  Spouse name: Not on file  ? Number of children: Not on file  ? Years of education: Not on file  ? Highest education level: Not on file  ?Occupational History  ? Not on file  ?Tobacco Use  ? Smoking status: Never  ?  Smokeless tobacco: Never  ?Vaping Use  ? Vaping Use: Never used  ?Substance and Sexual Activity  ? Alcohol use: No  ?  Alcohol/week: 0.0 standard drinks  ? Drug use: No  ? Sexual activity: Yes  ?  Partners: Male  ?  Birth control/protection: Surgical  ?  Comment: BTL  ?Other Topics Concern  ? Not on file  ?Social History Narrative  ? Not on file  ? ?Social Determinants of Health  ? ?Financial Resource Strain: Not on file  ?Food Insecurity: Not on file  ?Transportation Needs: Not on file  ?Physical Activity: Not on file  ?Stress: Not on file  ?Social Connections: Not on file  ?Intimate Partner Violence: Not on file  ? ? ?Past Surgical History:  ?Procedure Laterality Date  ? BIOPSY  07/04/2020  ? Procedure: BIOPSY;  Surgeon: Irving Copas., MD;  Location: [place];  Service: Gastroenterology;;  ? BIOPSY THYROID    ? COLONOSCOPY W/ POLYPECTOMY    ? ESOPHAGOGASTRODUODENOSCOPY (EGD) WITH PROPOFOL N/A 07/04/2020  ? Procedure: ESOPHAGOGASTRODUODENOSCOPY (EGD) WITH PROPOFOL;  Surgeon: Rush Landmark Telford Nab., MD;  Location: [place];  Service: Gastroenterology;  Laterality: N/A;  ? TUBAL LIGATION    ? UPPER ESOPHAGEAL ENDOSCOPIC ULTRASOUND (EUS) N/A 07/04/2020  ? Procedure: UPPER ESOPHAGEAL ENDOSCOPIC ULTRASOUND (EUS);  Surgeon: Rush Landmark,  Telford Nab., MD;  Location: [facility] ENDOSCOPY;  Service: Gastroenterology;  Laterality: N/A;  ? ? ?Family History  ?Problem Relation Age of Onset  ? Hypertension Mother   ? Heart disease Mother   ? Heart disease Father   ? Hypertension Brother   ? Colon cancer Neg Hx   ? Esophageal cancer Neg Hx   ? Pancreatic cancer Neg Hx   ? Stomach cancer Neg Hx   ? Liver disease Neg Hx   ? Inflammatory bowel disease Neg Hx   ? Rectal cancer Neg Hx   ? ? ?No Known Allergies ? ?Current Outpatient Medications on File Prior to Visit  ?Medication Sig Dispense Refill  ? ALPRAZolam (XANAX) 0.5 MG tablet Take 1 - 2 tablets by mouth every night as needed for insomnia. 60 tablet 0  ? famotidine  (PEPCID) 20 MG tablet Take 1 tablet (20 mg total) by mouth 2 (two) times daily. 180 tablet 0  ? fenofibrate (TRICOR) 145 MG tablet Take 1 tablet (145 mg total) by mouth daily. 90 tablet 2  ? hydrochlorothiazide (HYDRODIURIL) 25 MG tablet Take 1 tablet (25 mg total) by mouth daily. 90 tablet 2  ? meloxicam (MOBIC) 7.5 MG tablet Take 1 tablet (7.5 mg total) by mouth daily. 30 tablet 0  ? rosuvastatin (CRESTOR) 10 MG tablet Take 1 tablet (10 mg total) by mouth daily. 90 tablet 2  ? sucralfate (CARAFATE) 1 g tablet TAKE ONE TABLET FOUR TIMES PER DAY. IF UNABLE TO SWALLOW, YOU CAN CRUSH ONE TABLET AND DISSOLVE IN 10ML OF WARM WATER, MIX WELL TO CREATE SLURRY AND DRINK FOUR TIMES DAILY 120 tablet 0  ? traZODone (DESYREL) 50 MG tablet TAKE 1/2 TO 1 TABLET BY  MOUTH AT BEDTIME AS NEEDED  FOR SLEEP 30 tablet 11  ? nitroGLYCERIN (NITROSTAT) 0.4 MG SL tablet Place 1 tablet (0.4 mg total) under the tongue every 5 (five) minutes as needed. 25 tablet 3  ? ?Current Facility-Administered Medications on File Prior to Visit  ?Medication Dose Route Frequency Provider Last Rate Last Admin  ? 0.9 %  sodium chloride infusion  500 mL Intravenous Once Mansouraty, Telford Nab., MD      ? nitroGLYCERIN (NITROSTAT) SL tablet 0.4 mg  0.4 mg Sublingual Q5 min PRN Donato Heinz, MD   0.4 mg at 09/27/20 1550  ? ? ?BP 130/80   Pulse 83   Resp 18   Ht '5\' 1"'$  (1.549 m)   Wt 144 lb 9.6 oz (65.6 kg)   LMP 07/06/2008   SpO2 97%   BMI 27.32 kg/m?  ?  ?   ?Objective:  ? Physical Exam ? ?General- No acute distress. Pleasant patient. ?Neck- Full range of motion, no jvd ?Lungs- Clear, even and unlabored. ?Heart- regular rate and rhythm. ?Neurologic- CNII- XII grossly intact.  ? ? ?   ?Assessment & Plan:  ? ?Patient Instructions  ?For insomnia start lunesta 2 mg tab at night. Stop xanax as don't want you to be on 2 controlled meds for insomnia. Very important not to use both. Xanax prescription cancelled today. ? ?If Johnnye Sima does work then  will need to have you on contract and get urine drug screen. ? ?At end of exam you noted fatigue, thirsty and hungry. Also mild elevated sugar in the past. Will get cbc, cmp, tsh, t4, b12 , a1c and b1 level. ? ?Follow up in 2 weeks or sooner if needed. ? ? ?  ? ?Mackie Pai, PA-C  ?

## 2021-10-29 NOTE — Patient Instructions (Addendum)
For insomnia start lunesta 2 mg tab at night. Stop xanax as don't want you to be on 2 controlled meds for insomnia. Very important not to use both. Xanax prescription cancelled today. ? ?If Johnnye Sima does work then will need to have you on contract and get urine drug screen. ? ?At end of exam you noted fatigue, thirsty and hungry. Also mild elevated sugar in the past. Will get cbc, cmp, tsh, t4, b12 , a1c and b1 level. ? ?Follow up in 2 weeks or sooner if needed. ? ? ? ?

## 2021-10-29 NOTE — Addendum Note (Signed)
Addended by: Anabel Halon on: 10/29/2021 11:55 AM ? ? Modules accepted: Orders, Level of Service ? ?

## 2021-11-05 LAB — VITAMIN B1: Vitamin B1 (Thiamine): 506 nmol/L — ABNORMAL HIGH (ref 8–30)

## 2021-11-07 ENCOUNTER — Other Ambulatory Visit (HOSPITAL_BASED_OUTPATIENT_CLINIC_OR_DEPARTMENT_OTHER): Payer: Self-pay

## 2021-11-07 ENCOUNTER — Ambulatory Visit: Payer: Medicare Other | Admitting: Medical

## 2021-11-07 VITALS — BP 130/80 | HR 85 | Temp 98.2°F | Resp 18 | Ht 61.0 in | Wt 143.6 lb

## 2021-11-07 DIAGNOSIS — Z79899 Other long term (current) drug therapy: Secondary | ICD-10-CM

## 2021-11-07 DIAGNOSIS — G4700 Insomnia, unspecified: Secondary | ICD-10-CM | POA: Diagnosis not present

## 2021-11-07 DIAGNOSIS — F419 Anxiety disorder, unspecified: Secondary | ICD-10-CM | POA: Diagnosis not present

## 2021-11-07 MED ORDER — ALPRAZOLAM 0.5 MG PO TABS
ORAL_TABLET | ORAL | 3 refills | Status: DC
Start: 2021-11-07 — End: 2022-05-13
  Filled 2021-11-07: qty 60, 30d supply, fill #0
  Filled 2021-12-06 – 2021-12-15 (×2): qty 60, 30d supply, fill #1
  Filled 2022-02-06: qty 60, 30d supply, fill #2
  Filled 2022-03-04: qty 60, 30d supply, fill #3

## 2021-11-07 NOTE — Progress Notes (Signed)
? ?Subjective:  ? ? Patient ID: Patty [name], female    DOB: 07/24/1955, 66 y.o.   MRN: [id number] ? ?HPI ? ?I had written lunesta for pt as trial for insomnia. ? ?Pt states did not help and her skin itched little. She wants to get back on xanax.  She thinks this worked better.  ? ?We discussed possible use of estazolam which she asked for but I typically don't write that and she is ok with going back to xanax. ? ? ? ?Review of Systems  ?Constitutional:  Negative for chills, fatigue and fever.  ?Respiratory:  Negative for chest tightness and shortness of breath.   ?Cardiovascular:  Negative for chest pain and palpitations.  ?Gastrointestinal:  Negative for abdominal pain.  ?Neurological:  Negative for dizziness, seizures and headaches.  ?Hematological:  Negative for adenopathy. Does not bruise/bleed easily.  ?Psychiatric/Behavioral:  Positive for sleep disturbance. Negative for behavioral problems. The patient is nervous/anxious.   ? ? ?Past Medical History:  ?Diagnosis Date  ? Anxiety   ? Barrett's esophagus   ? Cardiomegaly   ? Dyspareunia   ? Fatty pancreas   ? Gastric ulcer   ? GERD (gastroesophageal reflux disease)   ? Hx of colonic polyps   ? Hyperlipidemia   ? Hypertension   ? Osteopenia   ? Osteoporosis   ? Shingles   ? Sleep apnea   ? Thyroid nodule   ? Uterine leiomyoma   ? ?  ?Social History  ? ?Socioeconomic History  ? Marital status: Married  ?  Spouse name: Not on file  ? Number of children: Not on file  ? Years of education: Not on file  ? Highest education level: Not on file  ?Occupational History  ? Not on file  ?Tobacco Use  ? Smoking status: Never  ? Smokeless tobacco: Never  ?Vaping Use  ? Vaping Use: Never used  ?Substance and Sexual Activity  ? Alcohol use: No  ?  Alcohol/week: 0.0 standard drinks  ? Drug use: No  ? Sexual activity: Yes  ?  Partners: Male  ?  Birth control/protection: Surgical  ?  Comment: BTL  ?Other Topics Concern  ? Not on file  ?Social History Narrative  ? Not on file   ? ?Social Determinants of Health  ? ?Financial Resource Strain: Not on file  ?Food Insecurity: Not on file  ?Transportation Needs: Not on file  ?Physical Activity: Not on file  ?Stress: Not on file  ?Social Connections: Not on file  ?Intimate Partner Violence: Not on file  ? ? ?Past Surgical History:  ?Procedure Laterality Date  ? BIOPSY  07/04/2020  ? Procedure: BIOPSY;  Surgeon: Irving Copas., MD;  Location: [place];  Service: Gastroenterology;;  ? BIOPSY THYROID    ? COLONOSCOPY W/ POLYPECTOMY    ? ESOPHAGOGASTRODUODENOSCOPY (EGD) WITH PROPOFOL N/A 07/04/2020  ? Procedure: ESOPHAGOGASTRODUODENOSCOPY (EGD) WITH PROPOFOL;  Surgeon: Rush Landmark Telford Nab., MD;  Location: [place];  Service: Gastroenterology;  Laterality: N/A;  ? TUBAL LIGATION    ? UPPER ESOPHAGEAL ENDOSCOPIC ULTRASOUND (EUS) N/A 07/04/2020  ? Procedure: UPPER ESOPHAGEAL ENDOSCOPIC ULTRASOUND (EUS);  Surgeon: Irving Copas., MD;  Location: [place];  Service: Gastroenterology;  Laterality: N/A;  ? ? ?Family History  ?Problem Relation Age of Onset  ? Hypertension Mother   ? Heart disease Mother   ? Heart disease Father   ? Hypertension Brother   ? Colon cancer Neg Hx   ? Esophageal cancer Neg Hx   ?  Pancreatic cancer Neg Hx   ? Stomach cancer Neg Hx   ? Liver disease Neg Hx   ? Inflammatory bowel disease Neg Hx   ? Rectal cancer Neg Hx   ? ? ?No Known Allergies ? ?Current Outpatient Medications on File Prior to Visit  ?Medication Sig Dispense Refill  ? ALPRAZolam (XANAX) 0.5 MG tablet Take 1 - 2 tablets by mouth every night as needed for insomnia. 60 tablet 0  ? eszopiclone (LUNESTA) 2 MG TABS tablet Take 1 tablet (2 mg total) by mouth at bedtime as needed for sleep. Take immediately before bedtime 14 tablet 0  ? famotidine (PEPCID) 20 MG tablet Take 1 tablet (20 mg total) by mouth 2 (two) times daily. 180 tablet 0  ? fenofibrate (TRICOR) 145 MG tablet Take 1 tablet (145 mg total) by mouth daily. 90 tablet 2  ?  hydrochlorothiazide (HYDRODIURIL) 25 MG tablet Take 1 tablet (25 mg total) by mouth daily. 90 tablet 2  ? meloxicam (MOBIC) 7.5 MG tablet Take 1 tablet (7.5 mg total) by mouth daily. 30 tablet 0  ? rosuvastatin (CRESTOR) 10 MG tablet Take 1 tablet (10 mg total) by mouth daily. 90 tablet 2  ? sucralfate (CARAFATE) 1 g tablet TAKE ONE TABLET FOUR TIMES PER DAY. IF UNABLE TO SWALLOW, YOU CAN CRUSH ONE TABLET AND DISSOLVE IN 10ML OF WARM WATER, MIX WELL TO CREATE SLURRY AND DRINK FOUR TIMES DAILY 120 tablet 0  ? traZODone (DESYREL) 50 MG tablet TAKE 1/2 TO 1 TABLET BY  MOUTH AT BEDTIME AS NEEDED  FOR SLEEP 30 tablet 11  ? nitroGLYCERIN (NITROSTAT) 0.4 MG SL tablet Place 1 tablet (0.4 mg total) under the tongue every 5 (five) minutes as needed. 25 tablet 3  ? ?Current Facility-Administered Medications on File Prior to Visit  ?Medication Dose Route Frequency Provider Last Rate Last Admin  ? 0.9 %  sodium chloride infusion  500 mL Intravenous Once Mansouraty, Telford Nab., MD      ? nitroGLYCERIN (NITROSTAT) SL tablet 0.4 mg  0.4 mg Sublingual Q5 min PRN Donato Heinz, MD   0.4 mg at 09/27/20 1550  ? ? ?BP 130/80   Pulse 85   Temp 98.2 ?F (36.8 ?C)   Resp 18   Ht '5\' 1"'$  (1.549 m)   Wt 143 lb 9.6 oz (65.1 kg)   LMP 07/06/2008   SpO2 100%   BMI 27.13 kg/m?  ?  ?   ?Objective:  ? Physical Exam ? ?General- No acute distress. Pleasant patient. ?Neck- Full range of motion, no jvd ?Lungs- Clear, even and unlabored. ?Heart- regular rate and rhythm. ?Neurologic- CNII- XII grossly intact.  ? ? ?   ?Assessment & Plan:  ? ?Patient Instructions  ?Anxiety and insomnia. DC lunesta and refilled former xanax. Up to date on contract and uds. Not to use lunesta further as I explained . ? ?Counseled on benefit vs risk of xanax. Answered questions.  ? ?Follow 4 months or sooner if needed  ? ?Mackie Pai, PA-C  ?

## 2021-11-07 NOTE — Patient Instructions (Signed)
Anxiety and insomnia. DC lunesta and refilled former xanax. Up to date on contract and uds. Not to use lunesta further as I explained . ? ?Counseled on benefit vs risk of xanax. Answered questions.  ? ?Follow 4 months or sooner if needed ?

## 2021-11-11 ENCOUNTER — Ambulatory Visit: Payer: Medicare Other | Admitting: Medical

## 2021-11-12 ENCOUNTER — Ambulatory Visit (INDEPENDENT_AMBULATORY_CARE_PROVIDER_SITE_OTHER): Payer: Medicare Other

## 2021-11-12 DIAGNOSIS — Z0000 Encounter for general adult medical examination without abnormal findings: Secondary | ICD-10-CM | POA: Diagnosis not present

## 2021-11-12 NOTE — Patient Instructions (Signed)
Patty [name] , ?Thank you for taking time to come for your Medicare Wellness Visit. I appreciate your ongoing commitment to your health goals. Please review the following plan we discussed and let me know if I can assist you in the future.  ? ?Screening recommendations/referrals: ?Colonoscopy: 06/06/19 due 04/05/29 ?Mammogram: declined  ?Bone Density: declined ?Recommended yearly ophthalmology/optometry visit for glaucoma screening and checkup ?Recommended yearly dental visit for hygiene and checkup ? ?Vaccinations: ?Influenza vaccine: Due-May obtain vaccine at your local pharmacy.  ?Pneumococcal vaccine: Due-May obtain vaccine at your local pharmacy.  ?Tdap vaccine: Due-May obtain vaccine at your local pharmacy.  ?Shingles vaccine: Due-May obtain vaccine at  your local pharmacy.    ?Covid-19:Due-May obtain vaccine at our your local pharmacy.  ? ?Advanced directives: yes, not on file ? ?Conditions/risks identified: see problem list ? ?Next appointment: Follow up in one year for your annual wellness visit  ? ? ?Preventive Care 67 Years and Older, Female ?Preventive care refers to lifestyle choices and visits with your health care provider that can promote health and wellness. ?What does preventive care include? ?A yearly physical exam. This is also called an annual well check. ?Dental exams once or twice a year. ?Routine eye exams. Ask your health care provider how often you should have your eyes checked. ?Personal lifestyle choices, including: ?Daily care of your teeth and gums. ?Regular physical activity. ?Eating a healthy diet. ?Avoiding tobacco and drug use. ?Limiting alcohol use. ?Practicing safe sex. ?Taking low-dose aspirin every day. ?Taking vitamin and mineral supplements as recommended by your health care provider. ?What happens during an annual well check? ?The services and screenings done by your health care provider during your annual well check will depend on your age, overall health, lifestyle risk factors,  and family history of disease. ?Counseling  ?Your health care provider may ask you questions about your: ?Alcohol use. ?Tobacco use. ?Drug use. ?Emotional well-being. ?Home and relationship well-being. ?Sexual activity. ?Eating habits. ?History of falls. ?Memory and ability to understand (cognition). ?Work and work Statistician. ?Reproductive health. ?Screening  ?You may have the following tests or measurements: ?Height, weight, and BMI. ?Blood pressure. ?Lipid and cholesterol levels. These may be checked every 5 years, or more frequently if you are over 79 years old. ?Skin check. ?Lung cancer screening. You may have this screening every year starting at age 35 if you have a 30-pack-year history of smoking and currently smoke or have quit within the past 15 years. ?Fecal occult blood test (FOBT) of the stool. You may have this test every year starting at age 6. ?Flexible sigmoidoscopy or colonoscopy. You may have a sigmoidoscopy every 5 years or a colonoscopy every 10 years starting at age 46. ?Hepatitis C blood test. ?Hepatitis B blood test. ?Sexually transmitted disease (STD) testing. ?Diabetes screening. This is done by checking your blood sugar (glucose) after you have not eaten for a while (fasting). You may have this done every 1-3 years. ?Bone density scan. This is done to screen for osteoporosis. You may have this done starting at age 30. ?Mammogram. This may be done every 1-2 years. Talk to your health care provider about how often you should have regular mammograms. ?Talk with your health care provider about your test results, treatment options, and if necessary, the need for more tests. ?Vaccines  ?Your health care provider may recommend certain vaccines, such as: ?Influenza vaccine. This is recommended every year. ?Tetanus, diphtheria, and acellular pertussis (Tdap, Td) vaccine. You may need a Td booster every 10  years. ?Zoster vaccine. You may need this after age 5. ?Pneumococcal 13-valent conjugate  (PCV13) vaccine. One dose is recommended after age 70. ?Pneumococcal polysaccharide (PPSV23) vaccine. One dose is recommended after age 47. ?Talk to your health care provider about which screenings and vaccines you need and how often you need them. ?This information is not intended to replace advice given to you by your health care provider. Make sure you discuss any questions you have with your health care provider. ?Document Released: 07/19/2015 Document Revised: 03/11/2016 Document Reviewed: 04/23/2015 ?Elsevier Interactive Patient Education ? 2017 [place]. ? ?Fall Prevention in the Home ?Falls can cause injuries. They can happen to people of all ages. There are many things you can do to make your home safe and to help prevent falls. ?What can I do on the outside of my home? ?Regularly fix the edges of walkways and driveways and fix any cracks. ?Remove anything that might make you trip as you walk through a door, such as a raised step or threshold. ?Trim any bushes or trees on the path to your home. ?Use bright outdoor lighting. ?Clear any walking paths of anything that might make someone trip, such as rocks or tools. ?Regularly check to see if handrails are loose or broken. Make sure that both sides of any steps have handrails. ?Any raised decks and porches should have guardrails on the edges. ?Have any leaves, snow, or ice cleared regularly. ?Use sand or salt on walking paths during winter. ?Clean up any spills in your garage right away. This includes oil or grease spills. ?What can I do in the bathroom? ?Use night lights. ?Install grab bars by the toilet and in the tub and shower. Do not use towel bars as grab bars. ?Use non-skid mats or decals in the tub or shower. ?If you need to sit down in the shower, use a plastic, non-slip stool. ?Keep the floor dry. Clean up any water that spills on the floor as soon as it happens. ?Remove soap buildup in the tub or shower regularly. ?Attach bath mats securely with  double-sided non-slip rug tape. ?Do not have throw rugs and other things on the floor that can make you trip. ?What can I do in the bedroom? ?Use night lights. ?Make sure that you have a light by your bed that is easy to reach. ?Do not use any sheets or blankets that are too big for your bed. They should not hang down onto the floor. ?Have a firm chair that has side arms. You can use this for support while you get dressed. ?Do not have throw rugs and other things on the floor that can make you trip. ?What can I do in the kitchen? ?Clean up any spills right away. ?Avoid walking on wet floors. ?Keep items that you use a lot in easy-to-reach places. ?If you need to reach something above you, use a strong step stool that has a grab bar. ?Keep electrical cords out of the way. ?Do not use floor polish or wax that makes floors slippery. If you must use wax, use non-skid floor wax. ?Do not have throw rugs and other things on the floor that can make you trip. ?What can I do with my stairs? ?Do not leave any items on the stairs. ?Make sure that there are handrails on both sides of the stairs and use them. Fix handrails that are broken or loose. Make sure that handrails are as long as the stairways. ?Check any carpeting to make sure  that it is firmly attached to the stairs. Fix any carpet that is loose or worn. ?Avoid having throw rugs at the top or bottom of the stairs. If you do have throw rugs, attach them to the floor with carpet tape. ?Make sure that you have a light switch at the top of the stairs and the bottom of the stairs. If you do not have them, ask someone to add them for you. ?What else can I do to help prevent falls? ?Wear shoes that: ?Do not have high heels. ?Have rubber bottoms. ?Are comfortable and fit you well. ?Are closed at the toe. Do not wear sandals. ?If you use a stepladder: ?Make sure that it is fully opened. Do not climb a closed stepladder. ?Make sure that both sides of the stepladder are locked  into place. ?Ask someone to hold it for you, if possible. ?Clearly mark and make sure that you can see: ?Any grab bars or handrails. ?First and last steps. ?Where the edge of each step is. ?Use tools tha

## 2021-11-12 NOTE — Progress Notes (Addendum)
? ?Subjective:  ? Patty [name] is a 66 y.o. female who presents for an Initial Medicare Annual Wellness Visit. ? ?I connected with  [name] on 11/12/21 by a audio enabled telemedicine application and verified that I am speaking with the correct person using two identifiers. ? ?Patient Location: Home ? ?Provider Location: Office/Clinic ? ?I discussed the limitations of evaluation and management by telemedicine. The patient expressed understanding and agreed to proceed.  ? ?Review of Systems    ? ?Cardiac Risk Factors include: advanced age (>24mn, >>79women);hypertension;dyslipidemia ? ?   ?Objective:  ?  ?There were no vitals filed for this visit. ?There is no height or weight on file to calculate BMI. ? ? ?  11/12/2021  ?  3:03 PM 09/08/2021  ?  2:09 PM 07/04/2020  ? 10:03 AM 03/18/2020  ?  2:29 PM 03/05/2020  ?  5:35 PM 03/05/2020  ? 12:35 PM  ?Advanced Directives  ?Does Patient Have a Medical Advance Directive? Yes No No No No No  ?Type of AP[place]amed[place]of [place] of facility DNR (pink MOST or yellow form);Living will       ?Copy of [place]? No - copy requested       ?Would patient like information on creating a medical advance directive? No - Patient declined Yes (MAU/Ambulatory/Procedural Areas - Information given)  No - Patient declined No - Patient declined   ? ? ?Current Medications (verified) ?Outpatient Encounter Medications as of 11/12/2021  ?Medication Sig  ? ALPRAZolam (XANAX) 0.5 MG tablet Take 1 - 2 tablets by mouth every night as needed for insomnia.  ? famotidine (PEPCID) 20 MG tablet Take 1 tablet (20 mg total) by mouth 2 (two) times daily.  ? fenofibrate (TRICOR) 145 MG tablet Take 1 tablet (145 mg total) by mouth daily.  ? hydrochlorothiazide (HYDRODIURIL) 25 MG tablet Take 1 tablet (25 mg total) by mouth daily.  ? meloxicam (MOBIC) 7.5 MG tablet Take 1 tablet (7.5 mg total) by mouth daily.  ? rosuvastatin (CRESTOR) 10 MG tablet Take 1  tablet (10 mg total) by mouth daily.  ? sucralfate (CARAFATE) 1 g tablet TAKE ONE TABLET FOUR TIMES PER DAY. IF UNABLE TO SWALLOW, YOU CAN CRUSH ONE TABLET AND DISSOLVE IN 10ML OF WARM WATER, MIX WELL TO CREATE SLURRY AND DRINK FOUR TIMES DAILY  ? traZODone (DESYREL) 50 MG tablet TAKE 1/2 TO 1 TABLET BY  MOUTH AT BEDTIME AS NEEDED  FOR SLEEP  ? nitroGLYCERIN (NITROSTAT) 0.4 MG SL tablet Place 1 tablet (0.4 mg total) under the tongue every 5 (five) minutes as needed.  ? ?Facility-Administered Encounter Medications as of 11/12/2021  ?Medication  ? 0.9 %  sodium chloride infusion  ? nitroGLYCERIN (NITROSTAT) SL tablet 0.4 mg  ? ? ?Allergies (verified) ?Patient has no known allergies.  ? ?History: ?Past Medical History:  ?Diagnosis Date  ? Anxiety   ? Barrett's esophagus   ? Cardiomegaly   ? Dyspareunia   ? Fatty pancreas   ? Gastric ulcer   ? GERD (gastroesophageal reflux disease)   ? Hx of colonic polyps   ? Hyperlipidemia   ? Hypertension   ? Osteopenia   ? Osteoporosis   ? Shingles   ? Sleep apnea   ? Thyroid nodule   ? Uterine leiomyoma   ? ?Past Surgical History:  ?Procedure Laterality Date  ? BIOPSY  07/04/2020  ? Procedure: BIOPSY;  Surgeon: MIrving Copas, MD;  Location:  [facility];  Service: Gastroenterology;;  ? BIOPSY THYROID    ? COLONOSCOPY W/ POLYPECTOMY    ? ESOPHAGOGASTRODUODENOSCOPY (EGD) WITH PROPOFOL N/A 07/04/2020  ? Procedure: ESOPHAGOGASTRODUODENOSCOPY (EGD) WITH PROPOFOL;  Surgeon: Rush Landmark Telford Nab., MD;  Location: [facility];  Service: Gastroenterology;  Laterality: N/A;  ? TUBAL LIGATION    ? UPPER ESOPHAGEAL ENDOSCOPIC ULTRASOUND (EUS) N/A 07/04/2020  ? Procedure: UPPER ESOPHAGEAL ENDOSCOPIC ULTRASOUND (EUS);  Surgeon: Irving Copas., MD;  Location: [facility];  Service: Gastroenterology;  Laterality: N/A;  ? ?Family History  ?Problem Relation Age of Onset  ? Hypertension Mother   ? Heart disease Mother   ? Heart disease Father   ? Hypertension Brother   ? Colon  cancer Neg Hx   ? Esophageal cancer Neg Hx   ? Pancreatic cancer Neg Hx   ? Stomach cancer Neg Hx   ? Liver disease Neg Hx   ? Inflammatory bowel disease Neg Hx   ? Rectal cancer Neg Hx   ? ?Social History  ? ?Socioeconomic History  ? Marital status: Married  ?  Spouse name: Not on file  ? Number of children: Not on file  ? Years of education: Not on file  ? Highest education level: Not on file  ?Occupational History  ? Not on file  ?Tobacco Use  ? Smoking status: Never  ? Smokeless tobacco: Never  ?Vaping Use  ? Vaping Use: Never used  ?Substance and Sexual Activity  ? Alcohol use: No  ?  Alcohol/week: 0.0 standard drinks  ? Drug use: No  ? Sexual activity: Yes  ?  Partners: Male  ?  Birth control/protection: Surgical  ?  Comment: BTL  ?Other Topics Concern  ? Not on file  ?Social History Narrative  ? Not on file  ? ?Social Determinants of Health  ? ?Financial Resource Strain: Low Risk   ? Difficulty of Paying Living Expenses: Not hard at all  ?Food Insecurity: No Food Insecurity  ? Worried About Charity fundraiser in the Last Year: Never true  ? Ran Out of Food in the Last Year: Never true  ?Transportation Needs: No Transportation Needs  ? Lack of Transportation (Medical): No  ? Lack of Transportation (Non-Medical): No  ?Physical Activity: Insufficiently Active  ? Days of Exercise per Week: 7 days  ? Minutes of Exercise per Session: 20 min  ?Stress: No Stress Concern Present  ? Feeling of Stress : Not at all  ?Social Connections: Moderately Integrated  ? Frequency of Communication with Friends and Family: More than three times a week  ? Frequency of Social Gatherings with Friends and Family: More than three times a week  ? Attends Religious Services: More than 4 times per year  ? Active Member of Clubs or Organizations: No  ? Attends Archivist Meetings: Never  ? Marital Status: Married  ? ? ?Tobacco Counseling ?Counseling given: Not Answered ? ? ?Clinical Intake: ? ?Pre-visit preparation completed:  Yes ? ?Pain : No/denies pain ? ?  ? ?Nutritional Risks: None ?Diabetes: No ? ?How often do you need to have someone help you when you read instructions, pamphlets, or other written materials from your doctor or pharmacy?: 1 - Never ? ?Diabetic?No ? ?Interpreter Needed?: No ? ?Information entered by :: Latrice Storlie ? ? ?Activities of Daily Living ? ?  11/12/2021  ?  3:05 PM  ?In your present state of health, do you have any difficulty performing the following activities:  ?Hearing? 0  ?  Vision? 0  ?Difficulty concentrating or making decisions? 0  ?Walking or climbing stairs? 0  ?Dressing or bathing? 0  ?Doing errands, shopping? 0  ?Preparing Food and eating ? N  ?Using the Toilet? N  ?In the past six months, have you accidently leaked urine? N  ?Do you have problems with loss of bowel control? N  ?Managing your Medications? N  ?Managing your Finances? N  ?Housekeeping or managing your Housekeeping? N  ? ? ?Patient Care Team: ?Saguier, Iris Pert as PCP - General (Internal Medicine) ? ?Indicate any recent Medical Services you may have received from other than [facility] providers in the past year (date may be approximate). ? ?   ?Assessment:  ? This is a routine wellness examination for Malgorzata. ? ?Hearing/Vision screen ?No results found. ? ?Dietary issues and exercise activities discussed: ?Current Exercise Habits: Home exercise routine, Type of exercise: walking, Time (Minutes): 20, Frequency (Times/Week): 7, Weekly Exercise (Minutes/Week): 140, Intensity: Mild, Exercise limited by: None identified ? ? Goals Addressed   ?None ?  ? ?Depression Screen ? ?  11/12/2021  ?  3:04 PM 03/25/2021  ? 11:03 AM 03/18/2020  ?  2:29 PM  ?PHQ 2/9 Scores  ?PHQ - 2 Score 0 0 0  ?PHQ- 9 Score   0  ?  ?Fall Risk ? ?  11/12/2021  ?  3:03 PM  ?Fall Risk   ?Falls in the past year? 0  ?Number falls in past yr: 0  ?Injury with Fall? 0  ?Risk for fall due to : No Fall Risks  ?Follow up Falls evaluation completed  ? ? ?FALL RISK PREVENTION PERTAINING  TO THE HOME: ? ?Any stairs in or around the home? Yes  ?If so, are there any without handrails? No  ?Home free of loose throw rugs in walkways, pet beds, electrical cords, etc? No  ?Adequate lighting in your h

## 2021-11-21 ENCOUNTER — Other Ambulatory Visit: Payer: Self-pay | Admitting: Medical

## 2021-12-06 ENCOUNTER — Other Ambulatory Visit (HOSPITAL_COMMUNITY): Payer: Self-pay

## 2021-12-12 ENCOUNTER — Other Ambulatory Visit: Payer: Self-pay | Admitting: Physician Assistant

## 2021-12-12 DIAGNOSIS — G4700 Insomnia, unspecified: Secondary | ICD-10-CM

## 2021-12-12 DIAGNOSIS — Z79899 Other long term (current) drug therapy: Secondary | ICD-10-CM

## 2021-12-15 ENCOUNTER — Other Ambulatory Visit (HOSPITAL_COMMUNITY): Payer: Self-pay

## 2021-12-15 ENCOUNTER — Other Ambulatory Visit (HOSPITAL_BASED_OUTPATIENT_CLINIC_OR_DEPARTMENT_OTHER): Payer: Self-pay

## 2021-12-18 ENCOUNTER — Other Ambulatory Visit: Payer: Self-pay | Admitting: Physician Assistant

## 2021-12-18 DIAGNOSIS — Z79899 Other long term (current) drug therapy: Secondary | ICD-10-CM

## 2021-12-18 DIAGNOSIS — G4700 Insomnia, unspecified: Secondary | ICD-10-CM

## 2022-02-06 ENCOUNTER — Other Ambulatory Visit: Payer: Self-pay | Admitting: Medical

## 2022-02-06 ENCOUNTER — Other Ambulatory Visit (HOSPITAL_COMMUNITY): Payer: Self-pay

## 2022-02-06 MED ORDER — MELOXICAM 7.5 MG PO TABS
7.5000 mg | ORAL_TABLET | Freq: Every day | ORAL | 0 refills | Status: DC
  Filled 2022-02-06: qty 30, 30d supply, fill #0

## 2022-02-11 ENCOUNTER — Other Ambulatory Visit: Payer: Self-pay | Admitting: Medical

## 2022-02-11 DIAGNOSIS — E781 Pure hyperglyceridemia: Secondary | ICD-10-CM

## 2022-02-13 ENCOUNTER — Other Ambulatory Visit: Payer: Self-pay | Admitting: Medical

## 2022-03-04 ENCOUNTER — Other Ambulatory Visit (HOSPITAL_COMMUNITY): Payer: Self-pay

## 2022-04-08 ENCOUNTER — Ambulatory Visit: Payer: Medicare Other | Admitting: Medical

## 2022-04-25 ENCOUNTER — Other Ambulatory Visit: Payer: Self-pay | Admitting: Medical

## 2022-04-25 DIAGNOSIS — G4700 Insomnia, unspecified: Secondary | ICD-10-CM

## 2022-04-25 DIAGNOSIS — Z79899 Other long term (current) drug therapy: Secondary | ICD-10-CM

## 2022-04-27 NOTE — Telephone Encounter (Signed)
Requesting: xanacx Contract:03/2021 UDS:03/07/2021 Last Visit:11/07/21 Next Visit:n/a Last Refill:11/07/21  Please Advise

## 2022-04-28 ENCOUNTER — Telehealth: Payer: Self-pay | Admitting: Medical

## 2022-04-28 NOTE — Telephone Encounter (Signed)
I tried to refill patient's Xanax but noticed that pharmacy was not designated on the refill request.  Also noted that her contract is expired.  So she needs to have office visit to renew contract and get UDS.  I have only plan to give her 7-day prescription to last year until she can get in.  Please call patient and get her appointment.  Let her pharmacy and I will send in 7-day prescription.

## 2022-04-30 IMAGING — CR DG LUMBAR SPINE 2-3V
3 series · 3 of 3 positions shown · non-contrast
Comparison: CT abdomen/pelvis 04/04/2021

CLINICAL DATA: Low back pain for 2 months

EXAM:
LUMBAR SPINE - 2-3 VIEW

[t l-spine a.p.]
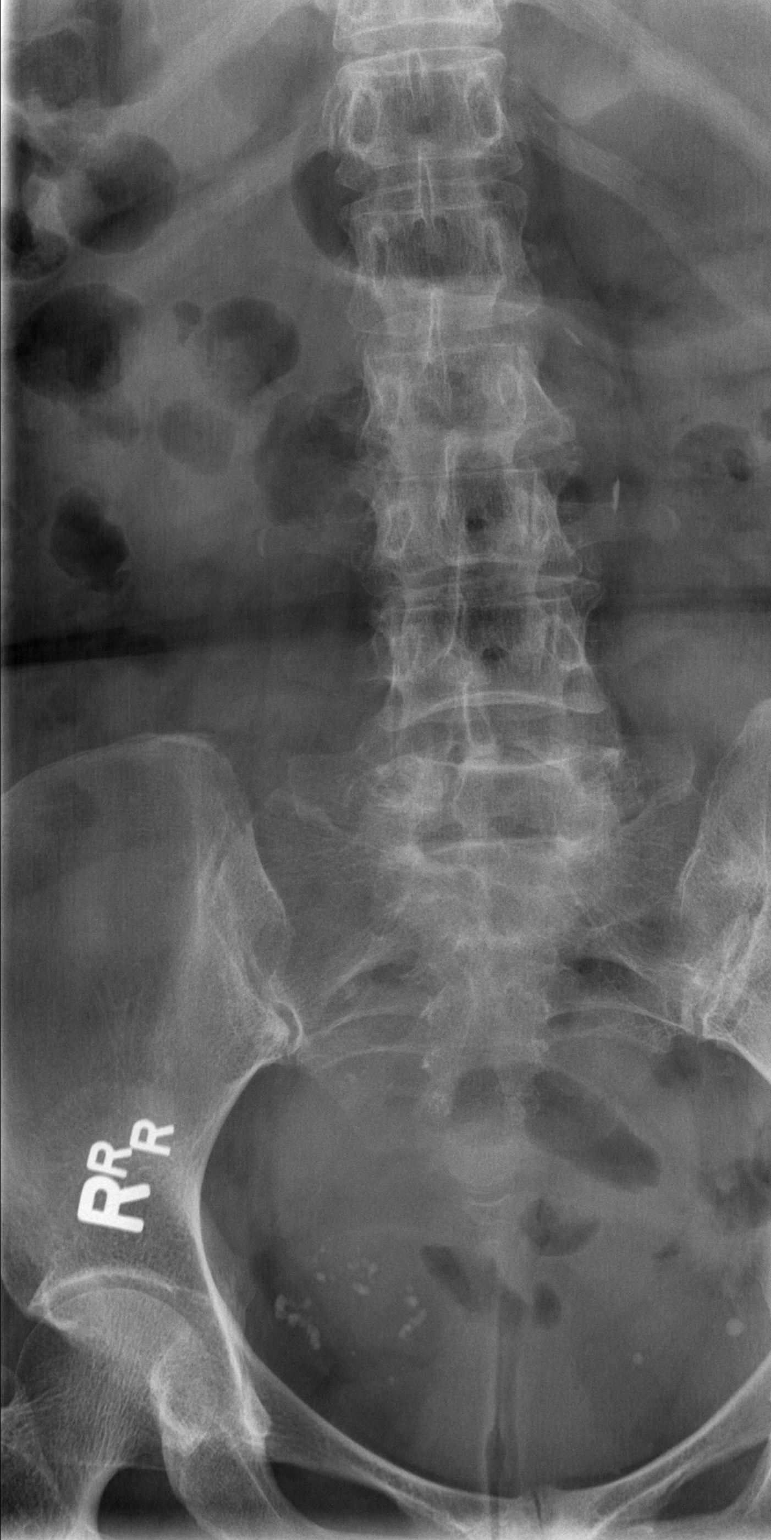

[t l-spine lat]
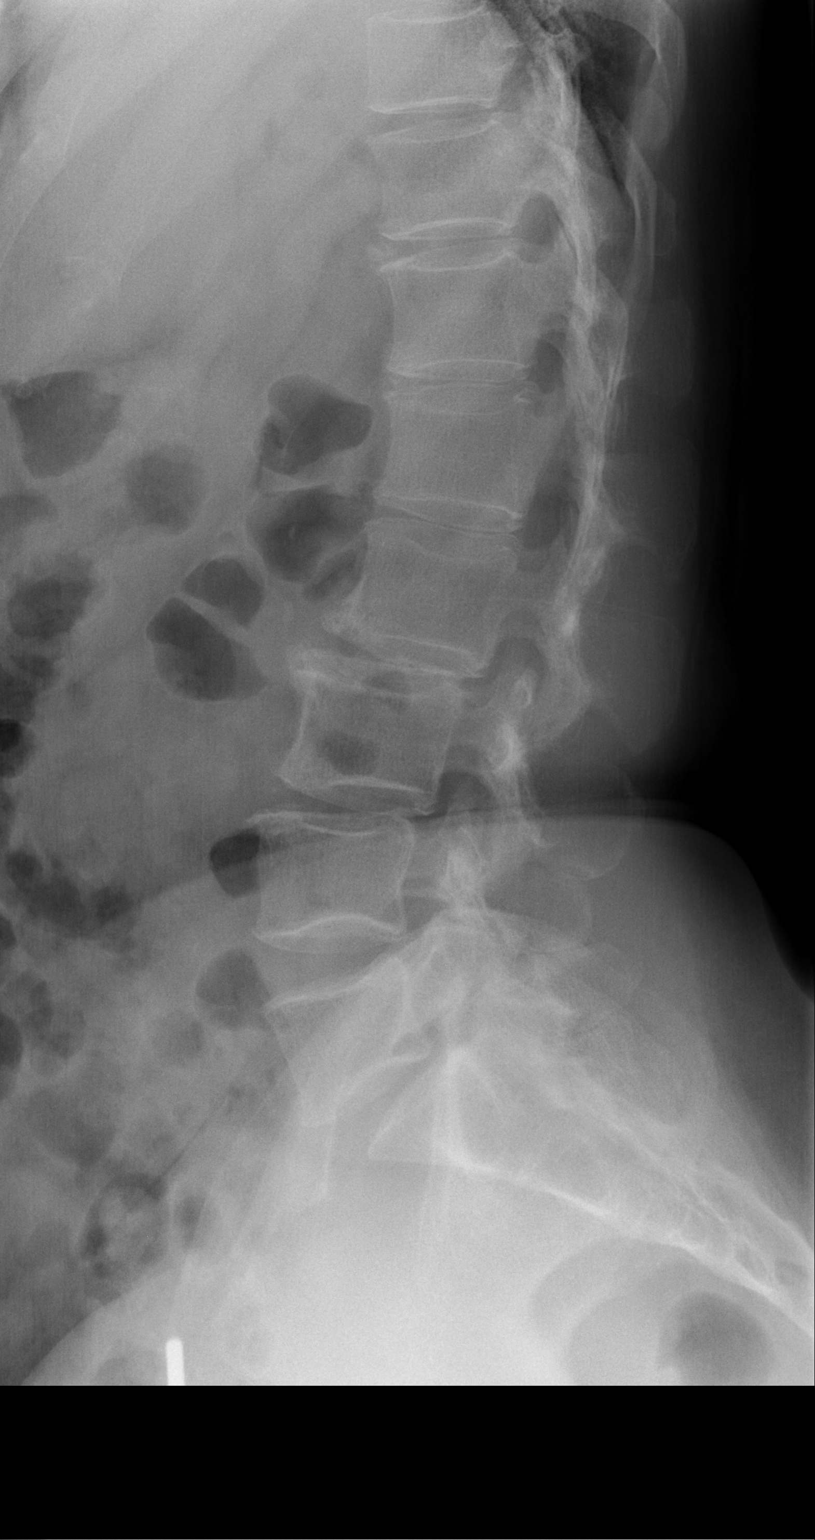

[t l-spine l5-s1 spot]
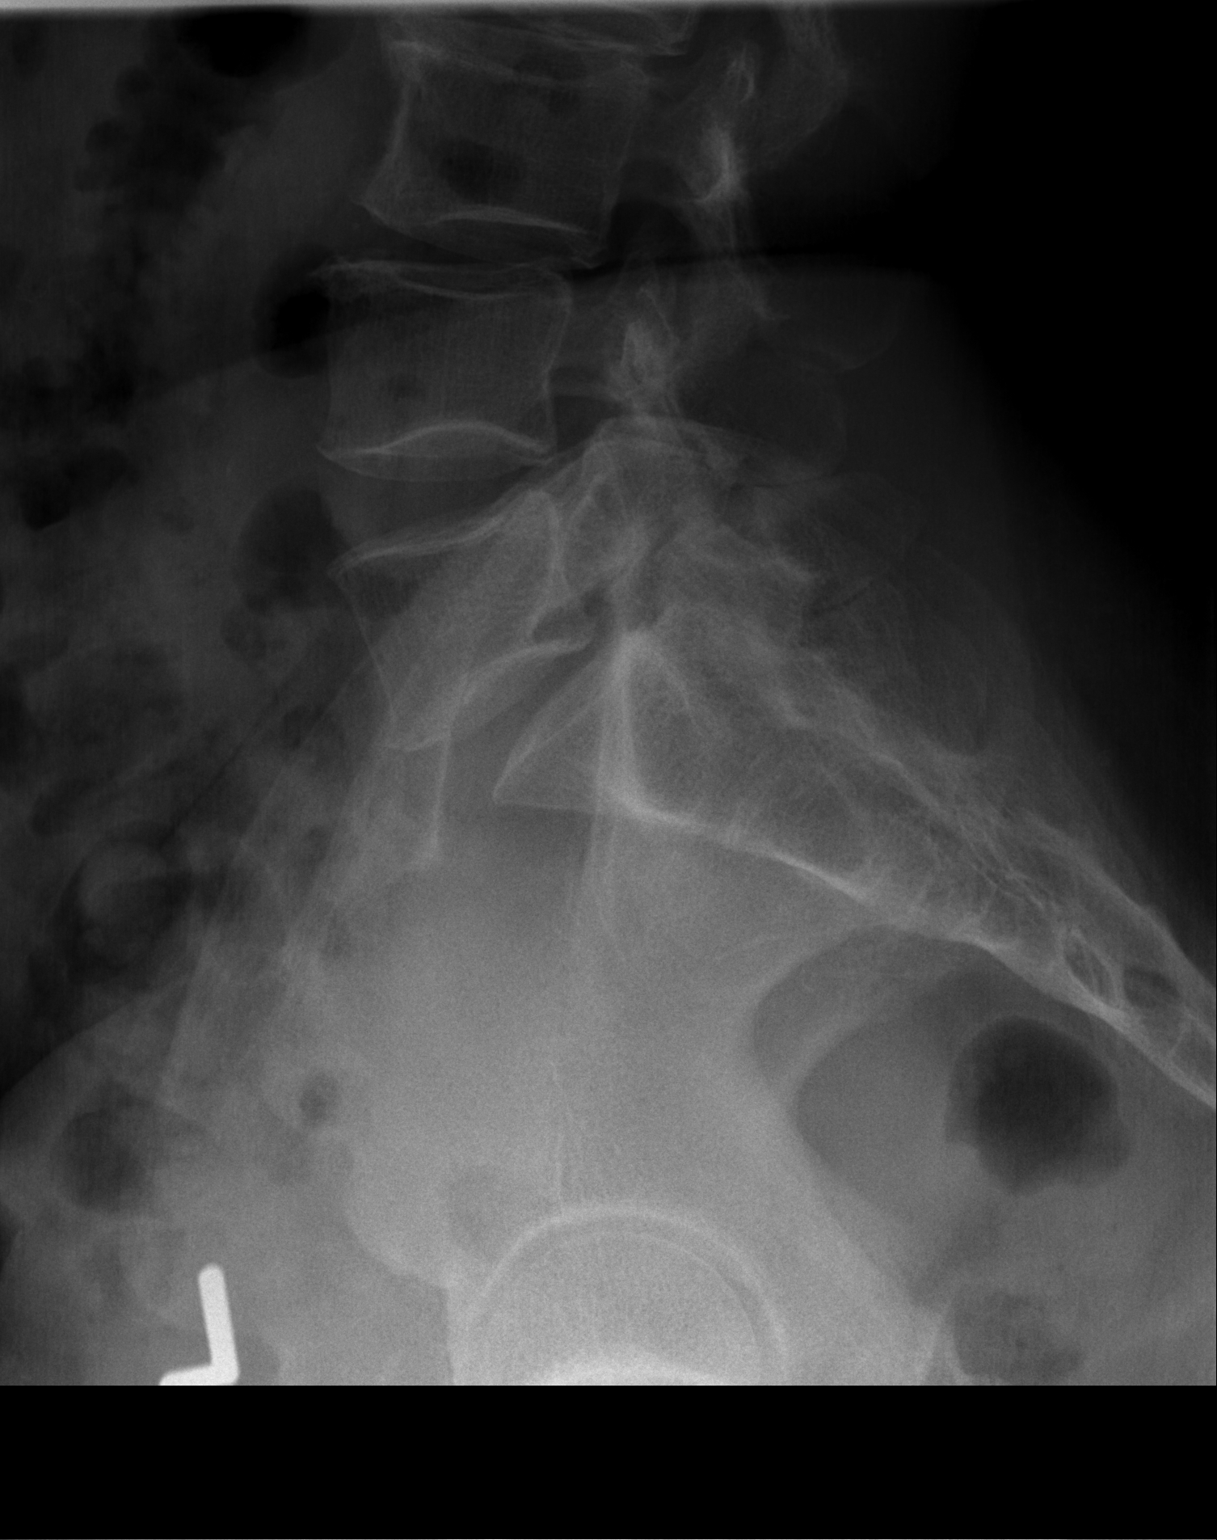

[3 of 3 positions shown; findings below may reference images not displayed]

FINDINGS: There are 5 non-rib-bearing lumbar type vertebral bodies. Vertebral
body heights are preserved, without evidence of acute fracture.
There is grade 1 retrolisthesis of L2 on L3 and L3 on L4, and grade
1 anterolisthesis of L5 on S1 with bilateral L5-S1 pars defects,
unchanged compared to the prior CT abdomen/pelvis from 04/04/2021.
There is mild intervertebral disc space narrowing with associated
degenerative endplate change at L2-L3.

The SI joints are intact. Calcifications in the pelvis likely
reflect partially calcified uterine fibroids as seen on prior CT.
IMPRESSION: 1. Grade 1 retrolisthesis of L2 on L3 and L3 on L4, and grade 1
anterolisthesis of L5 on S1 with associated bilateral L5-S1 pars
defects, unchanged compared to the prior CT abdomen/pelvis from
04/04/2021.
[DATE]. Disc space narrowing and degenerative endplate change most
advanced at L2-L3.

## 2022-04-30 NOTE — Telephone Encounter (Signed)
Pt called and lvm to return call 

## 2022-05-01 ENCOUNTER — Other Ambulatory Visit (HOSPITAL_COMMUNITY): Payer: Self-pay

## 2022-05-07 ENCOUNTER — Other Ambulatory Visit: Payer: Self-pay | Admitting: Medical

## 2022-05-07 DIAGNOSIS — Z79899 Other long term (current) drug therapy: Secondary | ICD-10-CM

## 2022-05-07 DIAGNOSIS — G4700 Insomnia, unspecified: Secondary | ICD-10-CM

## 2022-05-07 NOTE — Telephone Encounter (Signed)
Requesting: xanax Contract: 03/25/21 UDS:03/25/21 Last Visit:11/07/21 Next Visit: n/a Last Refill:11/07/21  Please Advise

## 2022-05-11 ENCOUNTER — Ambulatory Visit (INDEPENDENT_AMBULATORY_CARE_PROVIDER_SITE_OTHER): Payer: Medicare Other | Admitting: Medical

## 2022-05-11 VITALS — BP 136/78 | HR 86 | Temp 98.0°F | Resp 18 | Ht 61.0 in | Wt 146.4 lb

## 2022-05-11 DIAGNOSIS — F419 Anxiety disorder, unspecified: Secondary | ICD-10-CM

## 2022-05-11 DIAGNOSIS — E785 Hyperlipidemia, unspecified: Secondary | ICD-10-CM | POA: Diagnosis not present

## 2022-05-11 DIAGNOSIS — Z79899 Other long term (current) drug therapy: Secondary | ICD-10-CM | POA: Diagnosis not present

## 2022-05-11 DIAGNOSIS — G4700 Insomnia, unspecified: Secondary | ICD-10-CM

## 2022-05-11 NOTE — Progress Notes (Signed)
Subjective:    Patient ID: Patty [name], female    DOB: 1956/06/07, 66 y.o.   MRN: [id number]  HPI  Pt in anxiety. She needs uds and contract. In past gave xnax 0.5 mg bid prn anxiety. Gave 60 tabs with 3 refills.  Pt also uses xanax for insomonia.   She does well with med.  Htn- bp well controlled.  High cholesterol- pt decide to stop cholesterol meds      Review of Systems  Constitutional:  Negative for chills, fatigue and fever.  Respiratory:  Negative for cough, chest tightness, shortness of breath and wheezing.   Cardiovascular:  Negative for chest pain and palpitations.  Gastrointestinal:  Negative for abdominal pain.  Neurological:  Negative for dizziness, light-headedness and headaches.  Hematological:  Negative for adenopathy. Does not bruise/bleed easily.  Psychiatric/Behavioral:  Negative for behavioral problems. The patient is nervous/anxious.     Past Medical History:  Diagnosis Date   Anxiety    Barrett's esophagus    Cardiomegaly    Dyspareunia    Fatty pancreas    Gastric ulcer    GERD (gastroesophageal reflux disease)    Hx of colonic polyps    Hyperlipidemia    Hypertension    Osteopenia    Osteoporosis    Shingles    Sleep apnea    Thyroid nodule    Uterine leiomyoma      Social History   Socioeconomic History   Marital status: Married    Spouse name: Not on file   Number of children: Not on file   Years of education: Not on file   Highest education level: Not on file  Occupational History   Not on file  Tobacco Use   Smoking status: Never   Smokeless tobacco: Never  Vaping Use   Vaping Use: Never used  Substance and Sexual Activity   Alcohol use: No    Alcohol/week: 0.0 standard drinks of alcohol   Drug use: No   Sexual activity: Yes    Partners: Male    Birth control/protection: Surgical    Comment: BTL  Other Topics Concern   Not on file  Social History Narrative   Not on file   Social Determinants of Health    Financial Resource Strain: Low Risk  (11/12/2021)   Overall Financial Resource Strain (CARDIA)    Difficulty of Paying Living Expenses: Not hard at all  Food Insecurity: No Food Insecurity (11/12/2021)   Hunger Vital Sign    Worried About Running Out of Food in the Last Year: Never true    Ran Out of Food in the Last Year: Never true  Transportation Needs: No Transportation Needs (11/12/2021)   PRAPARE - Hydrologist (Medical): No    Lack of Transportation (Non-Medical): No  Physical Activity: Insufficiently Active (11/12/2021)   Exercise Vital Sign    Days of Exercise per Week: 7 days    Minutes of Exercise per Session: 20 min  Stress: No Stress Concern Present (11/12/2021)   [place]    Feeling of Stress : Not at all  Social Connections: Moderately Integrated (11/12/2021)   Social Connection and Isolation Panel [NHANES]    Frequency of Communication with Friends and Family: More than three times a week    Frequency of Social Gatherings with Friends and Family: More than three times a week    Attends Religious Services: More than 4 times per year  Active Member of Clubs or Organizations: No    Attends Archivist Meetings: Never    Marital Status: Married  Human resources officer Violence: Not At Risk (11/12/2021)   Humiliation, Afraid, Rape, and Kick questionnaire    Fear of Current or Ex-Partner: No    Emotionally Abused: No    Physically Abused: No    Sexually Abused: No    Past Surgical History:  Procedure Laterality Date   BIOPSY  07/04/2020   Procedure: BIOPSY;  Surgeon: Rush Landmark, Telford Nab., MD;  Location: [facility] ENDOSCOPY;  Service: Gastroenterology;;   BIOPSY THYROID     COLONOSCOPY W/ POLYPECTOMY     ESOPHAGOGASTRODUODENOSCOPY (EGD) WITH PROPOFOL N/A 07/04/2020   Procedure: ESOPHAGOGASTRODUODENOSCOPY (EGD) WITH PROPOFOL;  Surgeon: Irving Copas., MD;  Location:  [place];  Service: Gastroenterology;  Laterality: N/A;   TUBAL LIGATION     UPPER ESOPHAGEAL ENDOSCOPIC ULTRASOUND (EUS) N/A 07/04/2020   Procedure: UPPER ESOPHAGEAL ENDOSCOPIC ULTRASOUND (EUS);  Surgeon: Irving Copas., MD;  Location: [place];  Service: Gastroenterology;  Laterality: N/A;    Family History  Problem Relation Age of Onset   Hypertension Mother    Heart disease Mother    Heart disease Father    Hypertension Brother    Colon cancer Neg Hx    Esophageal cancer Neg Hx    Pancreatic cancer Neg Hx    Stomach cancer Neg Hx    Liver disease Neg Hx    Inflammatory bowel disease Neg Hx    Rectal cancer Neg Hx     No Known Allergies  Current Outpatient Medications on File Prior to Visit  Medication Sig Dispense Refill   ALPRAZolam (XANAX) 0.5 MG tablet Take 1 - 2 tablets by mouth every night as needed for insomnia. 60 tablet 3   famotidine (PEPCID) 20 MG tablet Take 1 tablet (20 mg total) by mouth 2 (two) times daily. 180 tablet 0   fenofibrate (TRICOR) 145 MG tablet TAKE 1 TABLET BY MOUTH DAILY 100 tablet 2   hydrochlorothiazide (HYDRODIURIL) 25 MG tablet TAKE 1 TABLET BY MOUTH DAILY 100 tablet 2   meloxicam (MOBIC) 7.5 MG tablet Take 1 tablet by mouth daily. 30 tablet 0   nitroGLYCERIN (NITROSTAT) 0.4 MG SL tablet Place 1 tablet (0.4 mg total) under the tongue every 5 (five) minutes as needed. 25 tablet 3   rosuvastatin (CRESTOR) 10 MG tablet TAKE 1 TABLET BY MOUTH DAILY 100 tablet 2   sucralfate (CARAFATE) 1 g tablet TAKE ONE TABLET FOUR TIMES PER DAY. IF UNABLE TO SWALLOW, YOU CAN CRUSH ONE TABLET AND DISSOLVE IN 10ML OF WARM WATER, MIX WELL TO CREATE SLURRY AND DRINK FOUR TIMES DAILY 120 tablet 0   traZODone (DESYREL) 50 MG tablet TAKE 1/2 TO 1 TABLET BY  MOUTH AT BEDTIME AS NEEDED  FOR SLEEP 30 tablet 11   Current Facility-Administered Medications on File Prior to Visit  Medication Dose Route Frequency Provider Last Rate Last Admin   0.9 %  sodium  chloride infusion  500 mL Intravenous Once Mansouraty, Telford Nab., MD       nitroGLYCERIN (NITROSTAT) SL tablet 0.4 mg  0.4 mg Sublingual Q5 min PRN Donato Heinz, MD   0.4 mg at 09/27/20 1550    BP 136/78   Pulse 86   Temp 98 F (36.7 C)   Resp 18   Ht '5\' 1"'$  (1.549 m)   Wt 146 lb 6.4 oz (66.4 kg)   LMP 07/06/2008   SpO2 98%  BMI 27.66 kg/m        Objective:   Physical Exam  General- No acute distress. Pleasant patient. Neck- Full range of motion, no jvd Lungs- Clear, even and unlabored. Heart- regular rate and rhythm. Neurologic- CNII- XII grossly intact.       Assessment & Plan:   Patient Instructions  Refilled xanax rx today.(For both anxiety and insomnia). Update uds and contract today.  Will give 60 tab rx with 3 refills. Explained controlled med rules and regulations today.  Htn- bp is well controlled today. Continue hctz.  High cholesterol and you stopped cholesterol meds. Will put in cmp and lipid.   Follow up in 6 month or sooner if needed.

## 2022-05-11 NOTE — Patient Instructions (Addendum)
Refilled xanax rx today.(For both anxiety and insomnia). Update uds and contract today.  Will give 60 tab rx with 3 refills. Explained controlled med rules and regulations today.  Htn- bp is well controlled today. Continue hctz.  High cholesterol and you stopped cholesterol meds. Will put in cmp and lipid. Future labs to be done fasting.  Follow up in 6 month or sooner if needed.

## 2022-05-12 ENCOUNTER — Other Ambulatory Visit: Payer: Medicare Other

## 2022-05-12 ENCOUNTER — Encounter: Payer: Self-pay | Admitting: Medical

## 2022-05-13 ENCOUNTER — Other Ambulatory Visit: Payer: Self-pay | Admitting: Medical

## 2022-05-13 DIAGNOSIS — G4700 Insomnia, unspecified: Secondary | ICD-10-CM

## 2022-05-13 DIAGNOSIS — Z79899 Other long term (current) drug therapy: Secondary | ICD-10-CM

## 2022-05-14 ENCOUNTER — Other Ambulatory Visit (HOSPITAL_COMMUNITY): Payer: Self-pay

## 2022-05-14 ENCOUNTER — Encounter: Payer: Self-pay | Admitting: Medical

## 2022-05-14 LAB — DRUG MONITORING PANEL 376104, URINE

## 2022-05-14 LAB — DM TEMPLATE

## 2022-05-14 MED ORDER — ALPRAZOLAM 0.5 MG PO TABS
0.2500 mg | ORAL_TABLET | Freq: Every evening | ORAL | 3 refills | Status: DC
  Filled 2022-05-14: qty 60, 60d supply, fill #0
  Filled 2022-06-30 – 2022-08-03 (×3): qty 60, 60d supply, fill #1
  Filled 2022-09-13 – 2022-09-30 (×3): qty 60, 60d supply, fill #2

## 2022-05-14 NOTE — Telephone Encounter (Signed)
Requesting: xanax Contract:05/11/22 UDS:05/11/22 Last Visit:05/11/22 Next Visit:n/a Last Refill:11/07/21  Please Advise

## 2022-05-15 ENCOUNTER — Other Ambulatory Visit (INDEPENDENT_AMBULATORY_CARE_PROVIDER_SITE_OTHER): Payer: Medicare Other

## 2022-05-15 ENCOUNTER — Other Ambulatory Visit (HOSPITAL_COMMUNITY): Payer: Self-pay

## 2022-05-15 DIAGNOSIS — E785 Hyperlipidemia, unspecified: Secondary | ICD-10-CM

## 2022-05-15 LAB — COMPREHENSIVE METABOLIC PANEL
ALT: 18 U/L (ref 0–35)
AST: 14 U/L (ref 0–37)
Albumin: 4.6 g/dL (ref 3.5–5.2)
Alkaline Phosphatase: 54 U/L (ref 39–117)
BUN: 19 mg/dL (ref 6–23)
CO2: 29 mEq/L (ref 19–32)
Calcium: 9.9 mg/dL (ref 8.4–10.5)
Chloride: 104 mEq/L (ref 96–112)
Creatinine, Ser: 0.84 mg/dL (ref 0.40–1.20)
GFR: 72.02 mL/min (ref >60.00)
Glucose, Bld: 94 mg/dL (ref 70–99)
Potassium: 3.9 mEq/L (ref 3.5–5.1)
Sodium: 139 mEq/L (ref 135–145)
Total Bilirubin: 0.7 mg/dL (ref 0.2–1.2)
Total Protein: 6.9 g/dL (ref 6.0–8.3)

## 2022-05-15 LAB — LIPID PANEL
Cholesterol: 211 mg/dL — ABNORMAL HIGH (ref 0–200)
HDL: 59.4 mg/dL (ref >39.00)
NonHDL: 151.17
Total CHOL/HDL Ratio: 4
Triglycerides: 229 mg/dL — ABNORMAL HIGH (ref 0.0–149.0)
VLDL: 45.8 mg/dL — ABNORMAL HIGH (ref 0.0–40.0)

## 2022-05-15 LAB — LDL CHOLESTEROL, DIRECT: Direct LDL: 115 mg/dL

## 2022-06-30 ENCOUNTER — Other Ambulatory Visit: Payer: Self-pay

## 2022-07-07 ENCOUNTER — Other Ambulatory Visit: Payer: Self-pay | Admitting: Medical

## 2022-07-07 ENCOUNTER — Other Ambulatory Visit (HOSPITAL_COMMUNITY): Payer: Self-pay

## 2022-07-07 MED ORDER — MELOXICAM 7.5 MG PO TABS
7.5000 mg | ORAL_TABLET | Freq: Every day | ORAL | 0 refills | Status: DC
  Filled 2022-07-07 – 2022-08-03 (×2): qty 30, 30d supply, fill #0

## 2022-07-13 ENCOUNTER — Other Ambulatory Visit (HOSPITAL_COMMUNITY): Payer: Self-pay

## 2022-07-15 ENCOUNTER — Telehealth: Payer: Medicare Other | Admitting: Family

## 2022-07-15 DIAGNOSIS — J069 Acute upper respiratory infection, unspecified: Secondary | ICD-10-CM

## 2022-07-15 MED ORDER — BENZONATATE 100 MG PO CAPS: 100.0000 mg | ORAL_CAPSULE | Freq: Three times a day (TID) | ORAL | 0 refills | Status: DC | PRN

## 2022-07-15 MED ORDER — FLUTICASONE PROPIONATE 50 MCG/ACT NA SUSP: 2.0000 | Freq: Every day | NASAL | 6 refills | Status: AC

## 2022-07-15 NOTE — Progress Notes (Signed)

## 2022-07-17 ENCOUNTER — Other Ambulatory Visit: Payer: Self-pay

## 2022-08-03 ENCOUNTER — Other Ambulatory Visit: Payer: Self-pay

## 2022-08-03 ENCOUNTER — Other Ambulatory Visit (HOSPITAL_COMMUNITY): Payer: Self-pay

## 2022-08-15 ENCOUNTER — Other Ambulatory Visit: Payer: Self-pay | Admitting: Medical

## 2022-09-14 ENCOUNTER — Other Ambulatory Visit: Payer: Self-pay

## 2022-09-18 ENCOUNTER — Encounter: Payer: Self-pay | Admitting: Medical

## 2022-09-18 ENCOUNTER — Ambulatory Visit (INDEPENDENT_AMBULATORY_CARE_PROVIDER_SITE_OTHER): Payer: Medicare Other | Admitting: Medical

## 2022-09-18 VITALS — BP 146/86 | HR 80 | Temp 98.0°F | Resp 18 | Ht 61.0 in | Wt 148.6 lb

## 2022-09-18 DIAGNOSIS — R739 Hyperglycemia, unspecified: Secondary | ICD-10-CM

## 2022-09-18 DIAGNOSIS — E785 Hyperlipidemia, unspecified: Secondary | ICD-10-CM

## 2022-09-18 DIAGNOSIS — Z23 Encounter for immunization: Secondary | ICD-10-CM

## 2022-09-18 DIAGNOSIS — I10 Essential (primary) hypertension: Secondary | ICD-10-CM

## 2022-09-18 DIAGNOSIS — E2839 Other primary ovarian failure: Secondary | ICD-10-CM | POA: Diagnosis not present

## 2022-09-18 DIAGNOSIS — E663 Overweight: Secondary | ICD-10-CM

## 2022-09-18 DIAGNOSIS — M8580 Other specified disorders of bone density and structure, unspecified site: Secondary | ICD-10-CM

## 2022-09-18 DIAGNOSIS — Z13820 Encounter for screening for osteoporosis: Secondary | ICD-10-CM

## 2022-09-18 LAB — COMPREHENSIVE METABOLIC PANEL
ALT: 21 U/L (ref 0–35)
AST: 16 U/L (ref 0–37)
Albumin: 4.6 g/dL (ref 3.5–5.2)
Alkaline Phosphatase: 73 U/L (ref 39–117)
BUN: 21 mg/dL (ref 6–23)
CO2: 30 mEq/L (ref 19–32)
Calcium: 10.9 mg/dL — ABNORMAL HIGH (ref 8.4–10.5)
Chloride: 102 mEq/L (ref 96–112)
Creatinine, Ser: 0.84 mg/dL (ref 0.40–1.20)
GFR: 71.85 mL/min (ref >60.00)
Glucose, Bld: 112 mg/dL — ABNORMAL HIGH (ref 70–99)
Potassium: 4.3 mEq/L (ref 3.5–5.1)
Sodium: 139 mEq/L (ref 135–145)
Total Bilirubin: 0.6 mg/dL (ref 0.2–1.2)
Total Protein: 7.2 g/dL (ref 6.0–8.3)

## 2022-09-18 LAB — LIPID PANEL
Cholesterol: 184 mg/dL (ref 0–200)
HDL: 56 mg/dL (ref >39.00)
LDL Cholesterol: 93 mg/dL (ref 0–99)
NonHDL: 128.02
Total CHOL/HDL Ratio: 3
Triglycerides: 173 mg/dL — ABNORMAL HIGH (ref 0.0–149.0)
VLDL: 34.6 mg/dL (ref 0.0–40.0)

## 2022-09-18 LAB — HEMOGLOBIN A1C: Hgb A1c MFr Bld: 5.8 % (ref 4.6–6.5)

## 2022-09-18 NOTE — Addendum Note (Signed)
Addended by: Jeronimo Greaves on: 09/18/2022 11:23 AM   Modules accepted: Orders

## 2022-09-18 NOTE — Progress Notes (Signed)
Subjective:    Patient ID: Patty [name], female    DOB: 1955/10/04, 67 y.o.   MRN: [id number]  HPI Pt in for follow up.  Pt checks her sugars at home and her sugars have been in 130 range. She is concerned for diabetes and wants to loose weight.   Pt has no hx of pancreatitis or hx of thyroid cancer. Pt has question on injectable for diabetes or weight loss. Rx advisement given in event I rx/   Pt will get pcv 20 today. She will get shingrix thru the pharamacy.  Pt willing to get bone density study. On review she is over due.      Review of Systems  Constitutional:  Negative for fatigue.  HENT:  Negative for congestion, drooling and ear discharge.   Respiratory:  Negative for cough, chest tightness, shortness of breath and wheezing.   Cardiovascular:  Negative for chest pain and palpitations.  Gastrointestinal:  Negative for abdominal pain, blood in stool and constipation.  Genitourinary:  Negative for dyspareunia, enuresis and flank pain.  Musculoskeletal:  Negative for back pain, joint swelling and myalgias.  Neurological:  Negative for dizziness and light-headedness.  Hematological:  Negative for adenopathy. Does not bruise/bleed easily.  Psychiatric/Behavioral:  Negative for behavioral problems, decreased concentration, dysphoric mood and hallucinations.    Past Medical History:  Diagnosis Date   Anxiety    Barrett's esophagus    Cardiomegaly    Dyspareunia    Fatty pancreas    Gastric ulcer    GERD (gastroesophageal reflux disease)    Hx of colonic polyps    Hyperlipidemia    Hypertension    Osteopenia    Osteoporosis    Shingles    Sleep apnea    Thyroid nodule    Uterine leiomyoma      Social History   Socioeconomic History   Marital status: Married    Spouse name: Not on file   Number of children: Not on file   Years of education: Not on file   Highest education level: Not on file  Occupational History   Not on file  Tobacco Use   Smoking  status: Never   Smokeless tobacco: Never  Vaping Use   Vaping Use: Never used  Substance and Sexual Activity   Alcohol use: No    Alcohol/week: 0.0 standard drinks of alcohol   Drug use: No   Sexual activity: Yes    Partners: Male    Birth control/protection: Surgical    Comment: BTL  Other Topics Concern   Not on file  Social History Narrative   Not on file   Social Determinants of Health   Financial Resource Strain: Low Risk  (11/12/2021)   Overall Financial Resource Strain (CARDIA)    Difficulty of Paying Living Expenses: Not hard at all  Food Insecurity: No Food Insecurity (11/12/2021)   Hunger Vital Sign    Worried About Running Out of Food in the Last Year: Never true    Ran Out of Food in the Last Year: Never true  Transportation Needs: No Transportation Needs (11/12/2021)   PRAPARE - Hydrologist (Medical): No    Lack of Transportation (Non-Medical): No  Physical Activity: Insufficiently Active (11/12/2021)   Exercise Vital Sign    Days of Exercise per Week: 7 days    Minutes of Exercise per Session: 20 min  Stress: No Stress Concern Present (11/12/2021)   [place]  Stress Questionnaire    Feeling of Stress : Not at all  Social Connections: Moderately Integrated (11/12/2021)   Social Connection and Isolation Panel [NHANES]    Frequency of Communication with Friends and Family: More than three times a week    Frequency of Social Gatherings with Friends and Family: More than three times a week    Attends Religious Services: More than 4 times per year    Active Member of [employer] or Organizations: No    Attends Archivist Meetings: Never    Marital Status: Married  Human resources officer Violence: Not At Risk (11/12/2021)   Humiliation, Afraid, Rape, and Kick questionnaire    Fear of Current or Ex-Partner: No    Emotionally Abused: No    Physically Abused: No    Sexually Abused: No    Past  Surgical History:  Procedure Laterality Date   BIOPSY  07/04/2020   Procedure: BIOPSY;  Surgeon: Mansouraty, Telford Nab., MD;  Location: [facility] ENDOSCOPY;  Service: Gastroenterology;;   BIOPSY THYROID     COLONOSCOPY W/ POLYPECTOMY     ESOPHAGOGASTRODUODENOSCOPY (EGD) WITH PROPOFOL N/A 07/04/2020   Procedure: ESOPHAGOGASTRODUODENOSCOPY (EGD) WITH PROPOFOL;  Surgeon: Irving Copas., MD;  Location: [place];  Service: Gastroenterology;  Laterality: N/A;   TUBAL LIGATION     UPPER ESOPHAGEAL ENDOSCOPIC ULTRASOUND (EUS) N/A 07/04/2020   Procedure: UPPER ESOPHAGEAL ENDOSCOPIC ULTRASOUND (EUS);  Surgeon: Irving Copas., MD;  Location: [place];  Service: Gastroenterology;  Laterality: N/A;    Family History  Problem Relation Age of Onset   Hypertension Mother    Heart disease Mother    Heart disease Father    Hypertension Brother    Colon cancer Neg Hx    Esophageal cancer Neg Hx    Pancreatic cancer Neg Hx    Stomach cancer Neg Hx    Liver disease Neg Hx    Inflammatory bowel disease Neg Hx    Rectal cancer Neg Hx     No Known Allergies  Current Outpatient Medications on File Prior to Visit  Medication Sig Dispense Refill   ALPRAZolam (XANAX) 0.5 MG tablet Take 0.5-1 tablets (0.25-0.5 mg total) by mouth every evening for insomnia 60 tablet 3   benzonatate (TESSALON PERLES) 100 MG capsule Take 1 capsule (100 mg total) by mouth 3 (three) times daily as needed. 20 capsule 0   famotidine (PEPCID) 20 MG tablet Take 1 tablet (20 mg total) by mouth 2 (two) times daily. 180 tablet 0   fenofibrate (TRICOR) 145 MG tablet TAKE 1 TABLET BY MOUTH DAILY 100 tablet 2   fluticasone (FLONASE) 50 MCG/ACT nasal spray Place 2 sprays into both nostrils daily. 16 g 6   hydrochlorothiazide (HYDRODIURIL) 25 MG tablet TAKE 1 TABLET BY MOUTH DAILY 100 tablet 2   meloxicam (MOBIC) 7.5 MG tablet Take 1 tablet (7.5 mg total) by mouth daily. 30 tablet 0   nitroGLYCERIN (NITROSTAT) 0.4 MG SL  tablet Place 1 tablet (0.4 mg total) under the tongue every 5 (five) minutes as needed. 25 tablet 3   rosuvastatin (CRESTOR) 10 MG tablet TAKE 1 TABLET BY MOUTH DAILY 100 tablet 2   sucralfate (CARAFATE) 1 g tablet TAKE ONE TABLET FOUR TIMES PER DAY. IF UNABLE TO SWALLOW, YOU CAN CRUSH ONE TABLET AND DISSOLVE IN 10ML OF WARM WATER, MIX WELL TO CREATE SLURRY AND DRINK FOUR TIMES DAILY 120 tablet 0   traZODone (DESYREL) 50 MG tablet TAKE 1/2 TO 1 TABLET BY  MOUTH AT BEDTIME AS  NEEDED  FOR SLEEP 30 tablet 11   Current Facility-Administered Medications on File Prior to Visit  Medication Dose Route Frequency Provider Last Rate Last Admin   0.9 %  sodium chloride infusion  500 mL Intravenous Once Mansouraty, Telford Nab., MD       nitroGLYCERIN (NITROSTAT) SL tablet 0.4 mg  0.4 mg Sublingual Q5 min PRN Donato Heinz, MD   0.4 mg at 09/27/20 1550    BP (!) 146/86   Pulse 80   Temp 98 F (36.7 C)   Resp 18   Ht 5\' 1"  (1.549 m)   Wt 148 lb 9.6 oz (67.4 kg)   LMP 07/06/2008   SpO2 98%   BMI 28.08 kg/m           Objective:   Physical Exam  General Mental Status- Alert. General Appearance- Not in acute distress.   Skin General: Color- Normal Color. Moisture- Normal Moisture.  Neck Carotid Arteries- Normal color. Moisture- Normal Moisture. No carotid bruits. No JVD.  Chest and Lung Exam Auscultation: Breath Sounds:-Normal.  Cardiovascular Auscultation:Rythm- Regular. Murmurs & Other Heart Sounds:Auscultation of the heart reveals- No Murmurs.  Abdomen Inspection:-Inspeection Normal. Palpation/Percussion:Note:No mass. Palpation and Percussion of the abdomen reveal- Non Tender, Non Distended + BS, no rebound or guarding.  Neurologic Cranial Nerve exam:- CN III-XII intact(No nystagmus), symmetric smile. Strength:- 5/5 equal and symmetric strength both upper and lower extremities.      Assessment & Plan:   Patient Instructions  1. Screening for osteoporosis Placed  below order. Can get scheduled thru xray dept downstairs. - DG Bone Density; Future  Ovarian failure due to menopause -bone density study  3. Hyperlipidemia, unspecified hyperlipidemia type Continue crestor 2-3 times a week as you report doing. May adjust tx after lab. - Comp Met (CMET) - Lipid panel  4. Elevated blood sugar Low sugar diet and exercise. May rx med If diabeteic. - Comp Met (CMET) - Hemoglobin A1c  5. Overweight (BMI 25.0-29.9) Diet and exercise  6. Hypertension, unspecified type Continue hctz. Check bp at home. If getting bp over 140/90 then would add second med.  Follow up in 3 months or sooner if needed.    Mackie Pai, PA-C

## 2022-09-18 NOTE — Patient Instructions (Signed)
1. Screening for osteoporosis Placed below order. Can get scheduled thru xray dept downstairs. - DG Bone Density; Future  Ovarian failure due to menopause -bone density study  3. Hyperlipidemia, unspecified hyperlipidemia type Continue crestor 2-3 times a week as you report doing. May adjust tx after lab. - Comp Met (CMET) - Lipid panel  4. Elevated blood sugar Low sugar diet and exercise. May rx med If diabeteic. - Comp Met (CMET) - Hemoglobin A1c  5. Overweight (BMI 25.0-29.9) Diet and exercise  6. Hypertension, unspecified type Continue hctz. Check bp at home. If getting bp over 140/90 then would add second med.  Follow up in 3 months or sooner if needed.

## 2022-09-21 DIAGNOSIS — K080 Exfoliation of teeth due to systemic causes: Secondary | ICD-10-CM | POA: Diagnosis not present

## 2022-09-22 ENCOUNTER — Ambulatory Visit (HOSPITAL_BASED_OUTPATIENT_CLINIC_OR_DEPARTMENT_OTHER)
Admission: RE | Admit: 2022-09-22 | Discharge: 2022-09-22 | Disposition: A | Discharge status: Home / Self Care | Payer: Medicare Other | Source: Ambulatory Visit | Attending: Medical | Admitting: Medical

## 2022-09-22 DIAGNOSIS — Z13820 Encounter for screening for osteoporosis: Secondary | ICD-10-CM | POA: Diagnosis not present

## 2022-09-22 DIAGNOSIS — E2839 Other primary ovarian failure: Secondary | ICD-10-CM | POA: Diagnosis not present

## 2022-09-22 DIAGNOSIS — M8589 Other specified disorders of bone density and structure, multiple sites: Secondary | ICD-10-CM | POA: Diagnosis not present

## 2022-09-25 DIAGNOSIS — M25561 Pain in right knee: Secondary | ICD-10-CM | POA: Diagnosis not present

## 2022-09-25 DIAGNOSIS — M25562 Pain in left knee: Secondary | ICD-10-CM | POA: Diagnosis not present

## 2022-09-25 DIAGNOSIS — M170 Bilateral primary osteoarthritis of knee: Secondary | ICD-10-CM | POA: Diagnosis not present

## 2022-09-26 MED ORDER — ALENDRONATE SODIUM 5 MG PO TABS: 5.0000 mg | ORAL_TABLET | Freq: Every day | ORAL | 11 refills | Status: DC

## 2022-09-26 NOTE — Addendum Note (Signed)
Addended by: Anabel Halon on: 09/26/2022 03:04 PM   Modules accepted: Orders

## 2022-09-29 ENCOUNTER — Other Ambulatory Visit (HOSPITAL_BASED_OUTPATIENT_CLINIC_OR_DEPARTMENT_OTHER): Payer: Self-pay

## 2022-09-29 ENCOUNTER — Other Ambulatory Visit (HOSPITAL_COMMUNITY): Payer: Self-pay

## 2022-09-30 ENCOUNTER — Other Ambulatory Visit: Payer: Self-pay

## 2022-10-19 ENCOUNTER — Encounter: Payer: Self-pay | Admitting: *Deleted

## 2022-10-29 ENCOUNTER — Telehealth: Payer: Self-pay | Admitting: Medical

## 2022-10-29 NOTE — Telephone Encounter (Signed)
Copied from CRM [id number]. Topic: Medicare AWV >> Oct 29, 2022 11:34 AM Payton Doughty wrote: Reason for CRM: Called patient to schedule Medicare Annual Wellness Visit (AWV). Left message for patient to call back and schedule Medicare Annual Wellness Visit (AWV).  Last date of AWV: 11/12/21  Please schedule an appointment at any time with Donne Anon, CMA  .  If any questions, please contact me.  Thank you ,  Verlee Rossetti; Care Guide Ambulatory Clinical Support [place] [facility] Direct Dial: [phone]

## 2022-11-18 ENCOUNTER — Ambulatory Visit (INDEPENDENT_AMBULATORY_CARE_PROVIDER_SITE_OTHER): Payer: Medicare Other | Admitting: Medical

## 2022-11-18 VITALS — BP 136/85 | HR 100 | Temp 98.0°F | Resp 18 | Ht 61.0 in | Wt 142.0 lb

## 2022-11-18 DIAGNOSIS — R42 Dizziness and giddiness: Secondary | ICD-10-CM

## 2022-11-18 DIAGNOSIS — E781 Pure hyperglyceridemia: Secondary | ICD-10-CM | POA: Diagnosis not present

## 2022-11-18 DIAGNOSIS — G4700 Insomnia, unspecified: Secondary | ICD-10-CM

## 2022-11-18 DIAGNOSIS — Z79899 Other long term (current) drug therapy: Secondary | ICD-10-CM

## 2022-11-18 DIAGNOSIS — R519 Headache, unspecified: Secondary | ICD-10-CM

## 2022-11-18 MED ORDER — ALENDRONATE SODIUM 5 MG PO TABS: 5.0000 mg | ORAL_TABLET | Freq: Every day | ORAL | 11 refills | Status: DC

## 2022-11-18 MED ORDER — FENOFIBRATE 145 MG PO TABS: 145.0000 mg | ORAL_TABLET | Freq: Every day | ORAL | 2 refills | Status: DC

## 2022-11-18 MED ORDER — MECLIZINE HCL 12.5 MG PO TABS: 12.5000 mg | ORAL_TABLET | Freq: Three times a day (TID) | ORAL | 0 refills | Status: DC | PRN

## 2022-11-18 MED ORDER — HYDROCHLOROTHIAZIDE 25 MG PO TABS: 25.0000 mg | ORAL_TABLET | Freq: Every day | ORAL | 2 refills | Status: DC

## 2022-11-18 NOTE — Patient Instructions (Addendum)
1.  New onset Dizziness  and New onset headache over past week. Since new and in your age group do recommend studies. Particularly want you to get ct. Rx meclizine but do want you to get labs today. Also will try to get you schedule for ct head. Can use tylenol for ha as that seemed to help a lot today. - CBC w/Diff - Comp Met (CMET) - CT HEAD WO CONTRAST ( ); Future   I am working on getting prior authorization. If you have worse symptoms waiting ct auth then be seen in the Emergency dept.  Follow up date to be determined after lab and imaging review.

## 2022-11-18 NOTE — Progress Notes (Signed)
Subjective:    Patient ID: Patty [name], female    DOB: 05/13/1956, 67 y.o.   MRN: [id number]  HPI  Pt in for dizziness. Pt states was dizzy for one week after she got back from [place]. Today she feels much better. She states just felt light headed. Does not describe vertigo. No ear pain. Mild headache today. Low level on and off ha for one week.   No nausea or vomiting. No motor or sensory deficits.   Some low level ha all week on and off. Today pain is more. Pt tylenol today for ha. Level 2/10 pain now. Before tylenol 8/10.   Review of Systems  Constitutional:  Positive for fatigue. Negative for chills.  HENT:  Positive for rhinorrhea. Negative for congestion, facial swelling and nosebleeds.        Mild runny nose for 2 days.  Respiratory:  Negative for cough, chest tightness, shortness of breath and wheezing.   Cardiovascular:  Negative for chest pain and palpitations.  Gastrointestinal:  Negative for abdominal distention, anal bleeding, blood in stool and rectal pain.  Genitourinary:  Negative for dysuria, flank pain and frequency.  Musculoskeletal:  Negative for back pain, joint swelling and myalgias.  Skin:  Negative for pallor and rash.  Neurological:  Positive for dizziness. Negative for tremors, seizures, speech difficulty, weakness and numbness.       Better today than all week.  Hematological:  Negative for adenopathy. Does not bruise/bleed easily.  Psychiatric/Behavioral:  Negative for behavioral problems and confusion.     Past Medical History:  Diagnosis Date   Anxiety    Barrett's esophagus    Cardiomegaly    Dyspareunia    Fatty pancreas    Gastric ulcer    GERD (gastroesophageal reflux disease)    Hx of colonic polyps    Hyperlipidemia    Hypertension    Osteopenia    Osteoporosis    Shingles    Sleep apnea    Thyroid nodule    Uterine leiomyoma      Social History   Socioeconomic History   Marital status: Married    Spouse name: Not on file    Number of children: Not on file   Years of education: Not on file   Highest education level: Master's degree (e.g., MA, MS, MEng, MEd, MSW, MBA)  Occupational History   Not on file  Tobacco Use   Smoking status: Never   Smokeless tobacco: Never  Vaping Use   Vaping Use: Never used  Substance and Sexual Activity   Alcohol use: No    Alcohol/week: 0.0 standard drinks of alcohol   Drug use: No   Sexual activity: Yes    Partners: Male    Birth control/protection: Surgical    Comment: BTL  Other Topics Concern   Not on file  Social History Narrative   Not on file   Social Determinants of Health   Financial Resource Strain: Low Risk  (11/17/2022)   Overall Financial Resource Strain (CARDIA)    Difficulty of Paying Living Expenses: Not hard at all  Food Insecurity: No Food Insecurity (11/17/2022)   Hunger Vital Sign    Worried About Running Out of Food in the Last Year: Never true    Ran Out of Food in the Last Year: Never true  Transportation Needs: No Transportation Needs (11/17/2022)   PRAPARE - Administrator, Civil Service (Medical): No    Lack of Transportation (Non-Medical): No  Physical Activity:  Unknown (11/17/2022)   Exercise Vital Sign    Days of Exercise per Week: 0 days    Minutes of Exercise per Session: Not on file  Stress: No Stress Concern Present (11/17/2022)   [employer] of Occupational Health - Occupational Stress Questionnaire    Feeling of Stress : Only a little  Social Connections: Socially Integrated (11/17/2022)   Social Connection and Isolation Panel [NHANES]    Frequency of Communication with Friends and Family: More than three times a week    Frequency of Social Gatherings with Friends and Family: More than three times a week    Attends Religious Services: More than 4 times per year    Active Member of Clubs or Organizations: Yes    Attends Banker Meetings: More than 4 times per year    Marital Status: Married  Careers information officer Violence: Not At Risk (11/12/2021)   Humiliation, Afraid, Rape, and Kick questionnaire    Fear of Current or Ex-Partner: No    Emotionally Abused: No    Physically Abused: No    Sexually Abused: No    Past Surgical History:  Procedure Laterality Date   BIOPSY  07/04/2020   Procedure: BIOPSY;  Surgeon: Meridee Score, Netty Starring., MD;  Location: [facility] ENDOSCOPY;  Service: Gastroenterology;;   BIOPSY THYROID     COLONOSCOPY W/ POLYPECTOMY     ESOPHAGOGASTRODUODENOSCOPY (EGD) WITH PROPOFOL N/A 07/04/2020   Procedure: ESOPHAGOGASTRODUODENOSCOPY (EGD) WITH PROPOFOL;  Surgeon: Lemar Lofty., MD;  Location: [facility] ENDOSCOPY;  Service: Gastroenterology;  Laterality: N/A;   TUBAL LIGATION     UPPER ESOPHAGEAL ENDOSCOPIC ULTRASOUND (EUS) N/A 07/04/2020   Procedure: UPPER ESOPHAGEAL ENDOSCOPIC ULTRASOUND (EUS);  Surgeon: Lemar Lofty., MD;  Location: [facility] ENDOSCOPY;  Service: Gastroenterology;  Laterality: N/A;    Family History  Problem Relation Age of Onset   Hypertension Mother    Heart disease Mother    Heart disease Father    Hypertension Brother    Colon cancer Neg Hx    Esophageal cancer Neg Hx    Pancreatic cancer Neg Hx    Stomach cancer Neg Hx    Liver disease Neg Hx    Inflammatory bowel disease Neg Hx    Rectal cancer Neg Hx     No Known Allergies  Current Outpatient Medications on File Prior to Visit  Medication Sig Dispense Refill   alendronate (FOSAMAX) 5 MG tablet Take 1 tablet (5 mg total) by mouth daily before breakfast. Take with a full glass of water on an empty stomach. 30 tablet 11   ALPRAZolam (XANAX) 0.5 MG tablet Take 0.5-1 tablets (0.25-0.5 mg total) by mouth every evening for insomnia 60 tablet 3   benzonatate (TESSALON PERLES) 100 MG capsule Take 1 capsule (100 mg total) by mouth 3 (three) times daily as needed. 20 capsule 0   famotidine (PEPCID) 20 MG tablet Take 1 tablet (20 mg total) by mouth 2 (two) times daily. 180 tablet 0   fenofibrate  (TRICOR) 145 MG tablet TAKE 1 TABLET BY MOUTH DAILY 100 tablet 2   fluticasone (FLONASE) 50 MCG/ACT nasal spray Place 2 sprays into both nostrils daily. 16 g 6   hydrochlorothiazide (HYDRODIURIL) 25 MG tablet TAKE 1 TABLET BY MOUTH DAILY 100 tablet 2   meloxicam (MOBIC) 7.5 MG tablet Take 1 tablet (7.5 mg total) by mouth daily. 30 tablet 0   nitroGLYCERIN (NITROSTAT) 0.4 MG SL tablet Place 1 tablet (0.4 mg total) under the tongue every 5 (five) minutes as  needed. 25 tablet 3   rosuvastatin (CRESTOR) 10 MG tablet TAKE 1 TABLET BY MOUTH DAILY 100 tablet 2   sucralfate (CARAFATE) 1 g tablet TAKE ONE TABLET FOUR TIMES PER DAY. IF UNABLE TO SWALLOW, YOU CAN CRUSH ONE TABLET AND DISSOLVE IN OF WARM WATER, MIX WELL TO CREATE SLURRY AND DRINK FOUR TIMES DAILY 120 tablet 0   Current Facility-Administered Medications on File Prior to Visit  Medication Dose Route Frequency Provider Last Rate Last Admin   0.9 %  sodium chloride infusion  500 mL Intravenous Once Mansouraty, Netty Starring., MD       nitroGLYCERIN (NITROSTAT) SL tablet 0.4 mg  0.4 mg Sublingual Q5 min PRN Little Ishikawa, MD   0.4 mg at 09/27/20 1550    BP 136/85   Pulse 100   Temp 98 F (36.7 C)   Resp 18   Ht 5\' 1"  (1.549 m)   Wt 142 lb (64.4 kg)   LMP 07/06/2008   SpO2 100%   BMI 26.83 kg/m        Objective:   Physical Exam  General Mental Status- Alert. General Appearance- Not in acute distress.   Skin General: Color- Normal Color. Moisture- Normal Moisture.  Neck Carotid Arteries- Normal color. Moisture- Normal Moisture. No carotid bruits. No JVD.  Chest and Lung Exam Auscultation: Breath Sounds:-Normal.  Cardiovascular Auscultation:Rythm- Regular. Murmurs & Other Heart Sounds:Auscultation of the heart reveals- No Murmurs.  Abdomen Inspection:-Inspeection Normal. Palpation/Percussion:Note:No mass. Palpation and Percussion of the abdomen reveal- Non Tender, Non Distended + BS, no rebound or  guarding.   Neurologic Cranial Nerve exam:- CN III-XII intact(No nystagmus), symmetric smile. Drift Test:- No drift. Romberg Exam:- Negative.  Heal to Toe Gait exam:-Normal. Finger to Nose:- Normal/Intact Strength:- 5/5 equal and symmetric strength both upper and lower extremities.       Assessment & Plan:   Patient Instructions  1.  New onset Dizziness  and New onset headache over past week. Since new and in your age group do recommend studies. Particularly want you to get ct. Rx meclizine but do want you to get labs today. Also will try to get you schedule for ct head.  - CBC w/Diff - Comp Met (CMET) - CT HEAD WO CONTRAST ( ); Future   I am working on getting prior authorization. If you have worse symptoms waiting ct auth then be seen in the Emergency dept.  Follow up date to be determined after lab and imaging review.        Esperanza Richters, PA-C   Refilled pt xanax. Pt up to date on uds and contract.

## 2022-11-19 ENCOUNTER — Other Ambulatory Visit: Payer: Self-pay | Admitting: Medical

## 2022-11-19 ENCOUNTER — Telehealth: Payer: Self-pay

## 2022-11-19 ENCOUNTER — Other Ambulatory Visit: Payer: Self-pay

## 2022-11-19 DIAGNOSIS — Z79899 Other long term (current) drug therapy: Secondary | ICD-10-CM

## 2022-11-19 DIAGNOSIS — G4700 Insomnia, unspecified: Secondary | ICD-10-CM

## 2022-11-19 LAB — COMPREHENSIVE METABOLIC PANEL
ALT: 18 U/L (ref 0–35)
AST: 18 U/L (ref 0–37)
Albumin: 4.8 g/dL (ref 3.5–5.2)
Alkaline Phosphatase: 78 U/L (ref 39–117)
BUN: 23 mg/dL (ref 6–23)
CO2: 29 mEq/L (ref 19–32)
Calcium: 10.6 mg/dL — ABNORMAL HIGH (ref 8.4–10.5)
Chloride: 98 mEq/L (ref 96–112)
Creatinine, Ser: 1.05 mg/dL (ref 0.40–1.20)
GFR: 54.9 mL/min — ABNORMAL LOW (ref >60.00)
Glucose, Bld: 116 mg/dL — ABNORMAL HIGH (ref 70–99)
Potassium: 4.1 mEq/L (ref 3.5–5.1)
Sodium: 137 mEq/L (ref 135–145)
Total Bilirubin: 0.4 mg/dL (ref 0.2–1.2)
Total Protein: 7.6 g/dL (ref 6.0–8.3)

## 2022-11-19 LAB — CBC WITH DIFFERENTIAL/PLATELET
Basophils Absolute: 0.1 10*3/uL (ref 0.0–0.1)
Basophils Relative: 0.8 % (ref 0.0–3.0)
Eosinophils Absolute: 0.2 10*3/uL (ref 0.0–0.7)
Eosinophils Relative: 1.9 % (ref 0.0–5.0)
HCT: 42.1 % (ref 36.0–46.0)
Hemoglobin: 14.4 g/dL (ref 12.0–15.0)
Lymphocytes Relative: 25.2 % (ref 12.0–46.0)
Lymphs Abs: 2.2 10*3/uL (ref 0.7–4.0)
MCHC: 34.1 g/dL (ref 30.0–36.0)
MCV: 89.5 fl (ref 78.0–100.0)
Monocytes Absolute: 0.8 10*3/uL (ref 0.1–1.0)
Monocytes Relative: 9 % (ref 3.0–12.0)
Neutro Abs: 5.5 10*3/uL (ref 1.4–7.7)
Neutrophils Relative %: 63.1 % (ref 43.0–77.0)
Platelets: 433 10*3/uL — ABNORMAL HIGH (ref 150.0–400.0)
RBC: 4.7 Mil/uL (ref 3.87–5.11)
RDW: 13.4 % (ref 11.5–15.5)
WBC: 8.7 10*3/uL (ref 4.0–10.5)

## 2022-11-19 MED ORDER — ALPRAZOLAM 0.5 MG PO TABS: 0.2500 mg | ORAL_TABLET | Freq: Every evening | ORAL | 0 refills | Status: DC

## 2022-11-19 NOTE — Telephone Encounter (Signed)
PA initiated via Covermymeds; KEY:  [id number]. Awaiting determination.

## 2022-11-19 NOTE — Telephone Encounter (Signed)
Will you check and see if pt ct head was prior authorized?

## 2022-11-19 NOTE — Telephone Encounter (Signed)
Yes

## 2022-11-19 NOTE — Addendum Note (Signed)
Addended by: Gwenevere Abbot on: 11/19/2022 04:24 PM   Modules accepted: Orders

## 2022-11-20 ENCOUNTER — Encounter: Payer: Self-pay | Admitting: Medical

## 2022-11-20 NOTE — Telephone Encounter (Signed)
Pt stated she felt better , will schedule CT if she starts to feel bad again

## 2022-11-20 NOTE — Telephone Encounter (Signed)
PA denied.   Denied. We denied this request under Medicare Part D because Fosamax has not met Non-Formulary Exception criteria requirements. Based on the information provided by your doctor, at least three alternative formulary medications in the same drug class or category (including the generic equivalent, if available) have not been tried. In this case, none of the alternative medications have been tried. Alternative medications include (please refer to your plan formulary): alendronate, ibandronate, risedronate, etc.

## 2022-11-21 ENCOUNTER — Other Ambulatory Visit (HOSPITAL_COMMUNITY): Payer: Self-pay

## 2022-11-21 MED ORDER — ALENDRONATE SODIUM 10 MG PO TABS: 10.0000 mg | ORAL_TABLET | Freq: Every day | ORAL | 11 refills | Status: AC

## 2022-11-21 NOTE — Addendum Note (Signed)
Addended by: Gwenevere Abbot on: 11/21/2022 09:52 AM   Modules accepted: Orders

## 2022-11-21 NOTE — Addendum Note (Signed)
Addended by: Gwenevere Abbot on: 11/21/2022 10:53 AM   Modules accepted: Orders

## 2022-11-23 NOTE — Telephone Encounter (Signed)
I assume its a dose issue.

## 2022-11-27 ENCOUNTER — Telehealth (HOSPITAL_BASED_OUTPATIENT_CLINIC_OR_DEPARTMENT_OTHER): Payer: Self-pay

## 2022-12-02 ENCOUNTER — Telehealth (HOSPITAL_BASED_OUTPATIENT_CLINIC_OR_DEPARTMENT_OTHER): Payer: Self-pay

## 2022-12-14 ENCOUNTER — Emergency Department (HOSPITAL_BASED_OUTPATIENT_CLINIC_OR_DEPARTMENT_OTHER): Payer: Medicare Other

## 2022-12-14 ENCOUNTER — Other Ambulatory Visit: Payer: Self-pay

## 2022-12-14 ENCOUNTER — Emergency Department (HOSPITAL_BASED_OUTPATIENT_CLINIC_OR_DEPARTMENT_OTHER)
Admission: EM | Admit: 2022-12-14 | Discharge: 2022-12-14 | Disposition: A | Discharge status: Home / Self Care | Payer: Medicare Other | Attending: Emergency Medicine | Admitting: Emergency Medicine

## 2022-12-14 ENCOUNTER — Encounter: Payer: Self-pay | Admitting: Medical

## 2022-12-14 ENCOUNTER — Encounter (HOSPITAL_BASED_OUTPATIENT_CLINIC_OR_DEPARTMENT_OTHER): Payer: Self-pay | Admitting: Emergency Medicine

## 2022-12-14 ENCOUNTER — Ambulatory Visit (INDEPENDENT_AMBULATORY_CARE_PROVIDER_SITE_OTHER): Payer: Medicare Other | Admitting: Medical

## 2022-12-14 VITALS — BP 140/80 | HR 84 | Resp 18 | Ht 61.0 in | Wt 143.4 lb

## 2022-12-14 DIAGNOSIS — M546 Pain in thoracic spine: Secondary | ICD-10-CM | POA: Diagnosis not present

## 2022-12-14 DIAGNOSIS — R0789 Other chest pain: Secondary | ICD-10-CM

## 2022-12-14 DIAGNOSIS — I10 Essential (primary) hypertension: Secondary | ICD-10-CM | POA: Diagnosis not present

## 2022-12-14 DIAGNOSIS — R079 Chest pain, unspecified: Secondary | ICD-10-CM | POA: Diagnosis not present

## 2022-12-14 DIAGNOSIS — E781 Pure hyperglyceridemia: Secondary | ICD-10-CM

## 2022-12-14 DIAGNOSIS — M79602 Pain in left arm: Secondary | ICD-10-CM

## 2022-12-14 DIAGNOSIS — G4700 Insomnia, unspecified: Secondary | ICD-10-CM

## 2022-12-14 DIAGNOSIS — Z79899 Other long term (current) drug therapy: Secondary | ICD-10-CM

## 2022-12-14 LAB — CBC WITH DIFFERENTIAL/PLATELET
Abs Immature Granulocytes: 0.03 10*3/uL (ref 0.00–0.07)
Basophils Absolute: 0.1 10*3/uL (ref 0.0–0.1)
Basophils Relative: 1 %
Eosinophils Absolute: 0.2 10*3/uL (ref 0.0–0.5)
Eosinophils Relative: 3 %
HCT: 40.6 % (ref 36.0–46.0)
Hemoglobin: 13.4 g/dL (ref 12.0–15.0)
Immature Granulocytes: 0 %
Lymphocytes Relative: 29 %
Lymphs Abs: 2.1 10*3/uL (ref 0.7–4.0)
MCH: 29.6 pg (ref 26.0–34.0)
MCHC: 33 g/dL (ref 30.0–36.0)
MCV: 89.8 fL (ref 80.0–100.0)
Monocytes Absolute: 0.7 10*3/uL (ref 0.1–1.0)
Monocytes Relative: 10 %
Neutro Abs: 4.3 10*3/uL (ref 1.7–7.7)
Neutrophils Relative %: 57 %
Platelets: 369 10*3/uL (ref 150–400)
RBC: 4.52 MIL/uL (ref 3.87–5.11)
RDW: 12.8 % (ref 11.5–15.5)
WBC: 7.4 10*3/uL (ref 4.0–10.5)
nRBC: 0 % (ref 0.0–0.2)

## 2022-12-14 LAB — TROPONIN I (HIGH SENSITIVITY)
Troponin I (High Sensitivity): 2 ng/L (ref <18)
Troponin I (High Sensitivity): 3 ng/L (ref <18)

## 2022-12-14 LAB — COMPREHENSIVE METABOLIC PANEL
ALT: 22 U/L (ref 0–44)
AST: 19 U/L (ref 15–41)
Albumin: 4.3 g/dL (ref 3.5–5.0)
Alkaline Phosphatase: 66 U/L (ref 38–126)
Anion gap: 8 (ref 5–15)
BUN: 25 mg/dL — ABNORMAL HIGH (ref 8–23)
CO2: 24 mmol/L (ref 22–32)
Calcium: 9.8 mg/dL (ref 8.9–10.3)
Chloride: 101 mmol/L (ref 98–111)
Creatinine, Ser: 0.98 mg/dL (ref 0.44–1.00)
GFR, Estimated: >60 mL/min (ref >60)
Glucose, Bld: 115 mg/dL — ABNORMAL HIGH (ref 70–99)
Potassium: 4.3 mmol/L (ref 3.5–5.1)
Sodium: 133 mmol/L — ABNORMAL LOW (ref 135–145)
Total Bilirubin: 0.6 mg/dL (ref 0.3–1.2)
Total Protein: 7.3 g/dL (ref 6.5–8.1)

## 2022-12-14 LAB — D-DIMER, QUANTITATIVE: D-Dimer, Quant: 0.34 ug/mL-FEU (ref 0.00–0.50)

## 2022-12-14 LAB — LIPASE, BLOOD: Lipase: 53 U/L — ABNORMAL HIGH (ref 11–51)

## 2022-12-14 NOTE — Discharge Instructions (Signed)
Continue Tylenol and ibuprofen for pain management.

## 2022-12-14 NOTE — ED Triage Notes (Signed)
Pt seen at PCP today.  Pt states she has been having back pain which has radiated arm.  Today has some chest pain.  Pt sent here to be evaluated.  No acute distress noted.

## 2022-12-14 NOTE — ED Notes (Signed)
Pt brought to room at 1551

## 2022-12-14 NOTE — ED Provider Notes (Signed)
[place] [facility]ERGENCY DEPARTMENT AT [facility] Provider Note   CSN: [id number] Arrival date & time: 12/14/22  1449     History  Chief Complaint  Patient presents with   Back Pain   Chest Pain    Patty [name] is a 67 y.o. female.  Patient here with chest and upper back pain on and off for the last week or so.  Did acupuncture treatment with improvement.  Was sent by primary care doctor to evaluate for cardiac etiology.  Did have some recent travel recently to [place].  Denies any active symptoms.  Pain worse with movement.  Worse when she takes deep breath.  History of hypertension, high cholesterol, acid reflux.  The history is provided by the patient.       Home Medications Prior to Admission medications   Medication Sig Start Date End Date Taking? Authorizing Provider  alendronate (FOSAMAX) 10 MG tablet Take 1 tablet (10 mg total) by mouth daily before breakfast. Take with a full glass of water on an empty stomach. 11/21/22   Saguier, Ramon Dredge, PA-C  ALPRAZolam Prudy Feeler) 0.5 MG tablet Take 0.5-1 tablets (0.25-0.5 mg total) by mouth every evening for insomnia 11/19/22   Saguier, Ramon Dredge, PA-C  benzonatate (TESSALON PERLES) 100 MG capsule Take 1 capsule (100 mg total) by mouth 3 (three) times daily as needed. 07/15/22   Junie Spencer, FNP  famotidine (PEPCID) 20 MG tablet Take 1 tablet (20 mg total) by mouth 2 (two) times daily. 09/15/21   Saguier, Ramon Dredge, PA-C  fenofibrate (TRICOR) 145 MG tablet Take 1 tablet (145 mg total) by mouth daily. 11/18/22   Saguier, Ramon Dredge, PA-C  fluticasone (FLONASE) 50 MCG/ACT nasal spray Place 2 sprays into both nostrils daily. 07/15/22   Jannifer Rodney A, FNP  hydrochlorothiazide (HYDRODIURIL) 25 MG tablet Take 1 tablet (25 mg total) by mouth daily. 11/18/22   Saguier, Ramon Dredge, PA-C  meclizine (ANTIVERT) 12.5 MG tablet Take 1 tablet (12.5 mg total) by mouth 3 (three) times daily as needed for dizziness. 11/18/22   Saguier, Ramon Dredge, PA-C  meloxicam  (MOBIC) 7.5 MG tablet Take 1 tablet (7.5 mg total) by mouth daily. 07/07/22   Saguier, Ramon Dredge, PA-C  nitroGLYCERIN (NITROSTAT) 0.4 MG SL tablet Place 1 tablet (0.4 mg total) under the tongue every 5 (five) minutes as needed. 09/27/20 12/26/20  Little Ishikawa, MD  rosuvastatin (CRESTOR) 10 MG tablet TAKE 1 TABLET BY MOUTH DAILY 08/17/22   Saguier, Ramon Dredge, PA-C  sucralfate (CARAFATE) 1 g tablet TAKE ONE TABLET FOUR TIMES PER DAY. IF UNABLE TO SWALLOW, YOU CAN CRUSH ONE TABLET AND DISSOLVE IN OF WARM WATER, MIX WELL TO CREATE SLURRY AND DRINK FOUR TIMES DAILY 10/07/20   Mansouraty, Netty Starring., MD      Allergies    Patient has no known allergies.    Review of Systems   Review of Systems  Physical Exam Updated Vital Signs BP (!) 141/92 (BP Location: Right Arm)   Pulse 74   Temp 98 F (36.7 C)   Resp 20   Ht 5\' 1"  (1.549 m)   Wt 65 kg   LMP 07/06/2008   SpO2 100%   BMI 27.10 kg/m  Physical Exam Vitals and nursing note reviewed.  Constitutional:      General: She is not in acute distress.    Appearance: She is well-developed. She is not ill-appearing.  HENT:     Head: Normocephalic and atraumatic.  Eyes:     Extraocular Movements: Extraocular movements  intact.     Conjunctiva/sclera: Conjunctivae normal.     Pupils: Pupils are equal, round, and reactive to light.  Cardiovascular:     Rate and Rhythm: Normal rate and regular rhythm.     Pulses:          Radial pulses are 2+ on the right side and 2+ on the left side.     Heart sounds: Normal heart sounds. No murmur heard. Pulmonary:     Effort: Pulmonary effort is normal. No respiratory distress.     Breath sounds: Normal breath sounds. No decreased breath sounds, wheezing or rhonchi.  Chest:     Chest wall: Tenderness present.  Abdominal:     Palpations: Abdomen is soft.     Tenderness: There is no abdominal tenderness.  Musculoskeletal:        General: No swelling.     Cervical back: Normal range of motion and neck  supple.  Skin:    General: Skin is warm and dry.     Capillary Refill: Capillary refill takes less than 2 seconds.  Neurological:     General: No focal deficit present.     Mental Status: She is alert.  Psychiatric:        Mood and Affect: Mood normal.     ED Results / Procedures / Treatments   Labs (all labs ordered are listed, but only abnormal results are displayed) Labs Reviewed  COMPREHENSIVE METABOLIC PANEL - Abnormal; Notable for the following components:      Result Value   Sodium 133 (*)    Glucose, Bld 115 (*)    BUN 25 (*)    All other components within normal limits  LIPASE, BLOOD - Abnormal; Notable for the following components:   Lipase 53 (*)    All other components within normal limits  CBC WITH DIFFERENTIAL/PLATELET  D-DIMER, QUANTITATIVE  TROPONIN I (HIGH SENSITIVITY)  TROPONIN I (HIGH SENSITIVITY)    EKG EKG Interpretation  Date/Time:  Monday December 14 2022 14:57:18 EDT Ventricular Rate:  71 PR Interval:  181 QRS Duration: 77 QT Interval:  392 QTC Calculation: 426 R Axis:   -17 Text Interpretation: Sinus rhythm Probable left atrial enlargement Confirmed by Virgina Norfolk ([id number]) on 12/14/2022 3:35:05 PM  Radiology DG Chest Portable 1 View  Result Date: 12/14/2022 CLINICAL DATA:  Left-sided upper back and chest pain EXAM: PORTABLE CHEST 1 VIEW COMPARISON:  09/11/2020 FINDINGS: Cardiac and mediastinal contours are within normal limits. No focal pulmonary opacity. No pleural effusion or pneumothorax. No acute osseous abnormality on this single AP view. IMPRESSION: No acute cardiopulmonary process. Electronically Signed   By: Wiliam Ke M.D.   On: 12/14/2022 16:52    Procedures Procedures    Medications Ordered in ED Medications - No data to display  ED Course/ Medical Decision Making/ A&P                             Medical Decision Making Amount and/or Complexity of Data Reviewed Labs: ordered. Radiology: ordered.   Patty [name] is here  with chest pain and upper back pain.  On and off for the last few days.  Sent for further workup by primary care doctor.  She is already had acupuncture and cupping done with good improvement.  She has reproducible pain on exam.  Differential is likely musculoskeletal process but will evaluate for PE and ACS and pneumothorax.  She did have recent plane travel.  Somewhat pleuritic pain.  CBC, troponin, D-dimer, chest x-ray and EKG obtained.  Per my review interpretation of EKG sinus rhythm.  No ischemic changes.  Troponin negative x 2.  D-dimer normal.  No significant anemia or electrolyte abnormality.  Chest x-ray with no evidence of pneumonia or pneumothorax.  Overall suspect MSK pain.  Continue ongoing pain management with Tylenol and ibuprofen and acupuncture.  Discharged in good condition.  Understands return precautions.  This chart was dictated using voice recognition software.  Despite best efforts to proofread,  errors can occur which can change the documentation meaning.         Final Clinical Impression(s) / ED Diagnoses Final diagnoses:  Atypical chest pain    Rx / DC Orders ED Discharge Orders     None         Virgina Norfolk, DO 12/14/22 1717

## 2022-12-14 NOTE — Patient Instructions (Addendum)
Recent atypical lower level time chest pain but at times accompanied by left sided posterior thorax pain as well as left shoulder pain.  You do have family history of cardiovascular disease and history of hypertension and high triglycerides.  You notes her brother had dissected aortic aneurysm in the past and you have concerns of this today.  Also due to the family history you are requesting troponin studies to be done.  Your EKG did not show any acute  ischemic changes.   Prior mild troponin elevation under the context of your close friend passing away and you being extremely stressed.  With your family history and your question for workup do recommend that you be evaluated in the emergency department.  They could determine next 1 or 2 sets of troponin not indicated.  Pain you are experiencing could be musculoskeletal pain but I think it is best not to assume that is the case until workup done in the emergency department.  I called and discussed case with emergency department MD before sending pt down.  Follow-up date to be determined after lab review.

## 2022-12-14 NOTE — Progress Notes (Signed)
Subjective:    Patient ID: Patty [name], female    DOB: August 25, 1955, 67 y.o.   MRN: [id number]  HPI  Pt has some pain in her upper back for about 2 weeks. She states massage did not. She also has pain in left upper extremity and some in her chest. Patient has been worried about if pain her back mostly. Mild chest pain today. Some left arm pain.  Chest pain has been coming and going for 2 weeks and since yesterday.  Pt states her brother had aortic aneurysm and dissected so she is worried about her back, mild chest and left arm. She had left arm pain for 2 weeks.  Pt asked for troponin level.   Pt has been taking took 6 baby aspirin on Saturday.  On pt ct abdomen did not show AAA.   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 Echocardiogram on 10/10/2020 showed normal biventricular function, and no significant valvular disease. Lexiscan Myoview was ordered but has not been done.    Over past 2 weeks had left side thoracic bck pain with left shoulder hurt for 2 weeks. She would have intermittent pain with chest pain over past 2 weeks.  Family history includes father died of MI at age 35, brother had CVA at 25, another brother had CVA at 38, and another brother had an aortic dissection at age 43.     Review of Systems  Constitutional:  Negative for chills, fatigue and fever.  Respiratory:  Negative for cough, chest tightness and shortness of breath.   Cardiovascular:  Negative for chest pain and palpitations.       Atypical low level chest pain.  Gastrointestinal:  Negative for abdominal pain.  Musculoskeletal:  Positive for back pain.       Left shoulder pain. Left sided back pain.  Neurological:  Negative for facial asymmetry and light-headedness.  Hematological:  Negative for adenopathy. Does not bruise/bleed easily.       Objective:   Physical Exam  General Mental Status- Alert. General Appearance- Not in acute  distress.   Skin General: Color- Normal Color. Moisture- Normal Moisture.  Neck Carotid Arteries- Normal color. Moisture- Normal Moisture. No carotid bruits. No JVD.  Chest and Lung Exam Auscultation: Breath Sounds:-Normal.  Cardiovascular Auscultation:Rythm- Regular. Murmurs & Other Heart Sounds:Auscultation of the heart reveals- No Murmurs.  Abdomen Inspection:-Inspeection Normal. Palpation/Percussion:Note:No mass. Palpation and Percussion of the abdomen reveal- Non Tender, Non Distended + BS, no rebound or guarding.    Neurologic Cranial Nerve exam:- CN III-XII intact(No nystagmus), symmetric smile. Strength:- 5/5 equal and symmetric strength both upper and lower extremities.      Assessment & Plan:   Patient Instructions  Recent atypical lower level time chest pain but at times accompanied by left sided posterior thorax pain as well as left shoulder pain.  You do have family history of cardiovascular disease and history of hypertension and high triglycerides.  You notes her brother had dissected aortic aneurysm in the past and you have concerns of this today.  Also due to the family history you are requesting troponin studies to be done.  Your EKG did not show any acute  ischemic changes.   Prior mild troponin elevation under the context of your close friend passing away and you being extremely stressed.  With your family history and your question for workup do recommend that you be evaluated in the emergency department.  They could  determine next 1 or 2 sets of troponin not indicated.  Pain you are experiencing could be musculoskeletal pain but I think it is best not to assume that is the case until workup done in the emergency department.  I called and discussed case with emergency department MD.  Follow-up date to be determined after lab review.   Esperanza Richters, PA-C    Time spent with patient today was  43 minutes which consisted of chart revdiew, discussing  diagnosis, work up treatment and documentation.   Esperanza Richters, PA-C

## 2022-12-14 NOTE — ED Provider Triage Note (Signed)
Emergency Medicine Provider Triage Evaluation Note  Patty [name] , a 67 y.o. female  was evaluated in triage.  Pt complains of left-sided back pain, left side chest pain and leg pain.  Patient states that her symptoms started about 2 weeks ago, are intermittent.  Are worse with movement, not worse with exertion or breathing.  Patient went to her PCP today and was sent to the ER for cardiac workup.  States that 2 or 3 years ago her friend committed suicide, she had chest pain and was very emotional, her troponins were found to be elevated.  This was treated with acupuncture and improved, states that she did follow-up with cardiology, did not have a cardiac cath, no other cardiac history.  Review of Systems  Positive: Left side arm, leg, back and chest pain Negative: Shortness of breath, exertional pain  Physical Exam  BP (!) 141/92 (BP Location: Right Arm)   Pulse 74   Temp 98 F (36.7 C)   Resp 20   Ht 5\' 1"  (1.549 m)   Wt 65 kg   LMP 07/06/2008   SpO2 100%   BMI 27.10 kg/m  Gen:   Awake, no distress   Resp:  Normal effort  MSK:   Moves extremities without difficulty  Other:  HR RRR. Left lower back TTP  Medical Decision Making  Medically screening exam initiated at 3:06 PM.  Appropriate orders placed.  Patty [name] was informed that the remainder of the evaluation will be completed by another provider, this initial triage assessment does not replace that evaluation, and the importance of remaining in the ED until their evaluation is complete.     Jeannie Fend, PA-C 12/14/22 1507

## 2022-12-15 ENCOUNTER — Ambulatory Visit (HOSPITAL_BASED_OUTPATIENT_CLINIC_OR_DEPARTMENT_OTHER): Payer: Medicare Other

## 2022-12-15 MED ORDER — ALPRAZOLAM 0.5 MG PO TABS: 0.2500 mg | ORAL_TABLET | Freq: Every evening | ORAL | 0 refills | Status: DC

## 2022-12-15 NOTE — Addendum Note (Signed)
Addended by: Gwenevere Abbot on: 12/15/2022 09:06 PM   Modules accepted: Orders

## 2022-12-18 ENCOUNTER — Ambulatory Visit: Payer: Medicare Other | Admitting: General Practice

## 2023-01-05 NOTE — Progress Notes (Signed)
Cardiology Office Note:    Date:  01/08/2023   ID:  Patty [name], DOB 11-02-1955, MRN [id number]  PCP:  Esperanza Richters, PA-C  Cardiologist:  None  Electrophysiologist:  None   Referring MD: Marisue Brooklyn   Chief Complaint  Patient presents with   Chest Pain    History of Present Illness:    Patty [name] is a 67 y.o. female with a hx of hypertension who presents for follow-up.  She was referred by Tandy Gaw, PA for evaluation of chest pain, initially seen on 09/27/2020.  She was seen in the ED on 09/11/2020 with chest pain.  Troponins 3 > 19.  It was recommended that she be admitted but she declined.  She reports she was under a lot of stress at that time, as a friend of hers had just died.  BP was up to 200s.  She does report she has been having chest pain a few times per week.  Describes as left-sided squeezing pain.  Can last for hours when it occurs.  Reports that she is currently having an episode of chest pain.  Occurs when she is stressed.  She does exercise by going for walks, denies any chest pain with walking.  Does report she will get short of breath.  Denies any lightheadedness, syncope, lower extremity edema.  No smoking history.  Family history includes father died of MI at age 25, brother had CVA at 4, another brother had CVA at 81, and another brother had an aortic dissection at age 95.  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  Echocardiogram on 10/10/2020 showed normal biventricular function, and no significant valvular disease.  Exercise Myoview 12/2020 showed good exercise capacity (9.3 METS), normal perfusion, EF 76%.  Since last clinic visit, she reports she is doing okay.  States that has been having chest pain recently.  She had ED visit 12/14/2022 with chest pain.  Workup unremarkable.  Negative troponin x 2, D-dimer normal.  She reports chest pain has been occurring about 3 times per week.  Describes chest tightness, no  clear relationship with exertion.  Reports some dyspnea with walking up stairs.  Denies any syncope, lower extremity edema, or palpitations.  She walks daily for 20 minutes.    Past Medical History:  Diagnosis Date   Anxiety    Barrett's esophagus    Cardiomegaly    Dyspareunia    Fatty pancreas    Gastric ulcer    GERD (gastroesophageal reflux disease)    Hx of colonic polyps    Hyperlipidemia    Hypertension    Osteopenia    Osteoporosis    Shingles    Sleep apnea    Thyroid nodule    Uterine leiomyoma     Past Surgical History:  Procedure Laterality Date   BIOPSY  07/04/2020   Procedure: BIOPSY;  Surgeon: Lemar Lofty., MD;  Location: [facility] ENDOSCOPY;  Service: Gastroenterology;;   BIOPSY THYROID     COLONOSCOPY W/ POLYPECTOMY     ESOPHAGOGASTRODUODENOSCOPY (EGD) WITH PROPOFOL N/A 07/04/2020   Procedure: ESOPHAGOGASTRODUODENOSCOPY (EGD) WITH PROPOFOL;  Surgeon: Lemar Lofty., MD;  Location: [facility] ENDOSCOPY;  Service: Gastroenterology;  Laterality: N/A;   TUBAL LIGATION     UPPER ESOPHAGEAL ENDOSCOPIC ULTRASOUND (EUS) N/A 07/04/2020   Procedure: UPPER ESOPHAGEAL ENDOSCOPIC ULTRASOUND (EUS);  Surgeon: Lemar Lofty., MD;  Location: [facility] ENDOSCOPY;  Service: Gastroenterology;  Laterality: N/A;    Current Medications: Current  Meds  Medication Sig   ALPRAZolam (XANAX) 0.5 MG tablet Take 0.5-1 tablets (0.25-0.5 mg total) by mouth every evening for insomnia   famotidine (PEPCID) 20 MG tablet Take 1 tablet (20 mg total) by mouth 2 (two) times daily.   fenofibrate (TRICOR) 145 MG tablet Take 1 tablet (145 mg total) by mouth daily.   fluticasone (FLONASE) 50 MCG/ACT nasal spray Place 2 sprays into both nostrils daily.   hydrochlorothiazide (HYDRODIURIL) 25 MG tablet Take 1 tablet (25 mg total) by mouth daily.   nitroGLYCERIN (NITROSTAT) 0.4 MG SL tablet Place 1 tablet (0.4 mg total) under the tongue every 5 (five) minutes as needed.   [DISCONTINUED]  benzonatate (TESSALON PERLES) 100 MG capsule Take 1 capsule (100 mg total) by mouth 3 (three) times daily as needed.   [DISCONTINUED] meclizine (ANTIVERT) 12.5 MG tablet Take 1 tablet (12.5 mg total) by mouth 3 (three) times daily as needed for dizziness.   [DISCONTINUED] meloxicam (MOBIC) 7.5 MG tablet Take 1 tablet (7.5 mg total) by mouth daily.   [DISCONTINUED] sucralfate (CARAFATE) 1 g tablet TAKE ONE TABLET FOUR TIMES PER DAY. IF UNABLE TO SWALLOW, YOU CAN CRUSH ONE TABLET AND DISSOLVE IN OF WARM WATER, MIX WELL TO CREATE SLURRY AND DRINK FOUR TIMES DAILY   Current Facility-Administered Medications for the 01/08/23 encounter (Office Visit) with Little Ishikawa, MD  Medication   0.9 %  sodium chloride infusion   nitroGLYCERIN (NITROSTAT) SL tablet 0.4 mg     Allergies:   Patient has no known allergies.   Social History   Socioeconomic History   Marital status: Married    Spouse name: Not on file   Number of children: Not on file   Years of education: Not on file   Highest education level: Master's degree (e.g., MA, MS, MEng, MEd, MSW, MBA)  Occupational History   Not on file  Tobacco Use   Smoking status: Never   Smokeless tobacco: Never  Vaping Use   Vaping Use: Never used  Substance and Sexual Activity   Alcohol use: No    Alcohol/week: 0.0 standard drinks of alcohol   Drug use: No   Sexual activity: Yes    Partners: Male    Birth control/protection: Surgical    Comment: BTL  Other Topics Concern   Not on file  Social History Narrative   Not on file   Social Determinants of Health   Financial Resource Strain: Low Risk  (11/17/2022)   Overall Financial Resource Strain (CARDIA)    Difficulty of Paying Living Expenses: Not hard at all  Food Insecurity: No Food Insecurity (11/17/2022)   Hunger Vital Sign    Worried About Running Out of Food in the Last Year: Never true    Ran Out of Food in the Last Year: Never true  Transportation Needs: No Transportation  Needs (11/17/2022)   PRAPARE - Administrator, Civil Service (Medical): No    Lack of Transportation (Non-Medical): No  Physical Activity: Unknown (11/17/2022)   Exercise Vital Sign    Days of Exercise per Week: 0 days    Minutes of Exercise per Session: Not on file  Stress: No Stress Concern Present (11/17/2022)   [employer] of Occupational Health - Occupational Stress Questionnaire    Feeling of Stress : Only a little  Social Connections: Socially Integrated (11/17/2022)   Social Connection and Isolation Panel [NHANES]    Frequency of Communication with Friends and Family: More than three times a week  Frequency of Social Gatherings with Friends and Family: More than three times a week    Attends Religious Services: More than 4 times per year    Active Member of [employer] or Organizations: Yes    Attends Engineer, structural: More than 4 times per year    Marital Status: Married     Family History: The patient's family history includes Heart disease in her father and mother; Hypertension in her brother and mother. There is no history of Colon cancer, Esophageal cancer, Pancreatic cancer, Stomach cancer, Liver disease, Inflammatory bowel disease, or Rectal cancer.  ROS:   Please see the history of present illness.     All other systems reviewed and are negative.  EKGs/Labs/Other Studies Reviewed:    The following studies were reviewed today:   EKG:   01/08/23: Sinus rhythm, rate 70, Q waves in leads III, aVF, poor R wave progression, no ST abnormality  Recent Labs: 12/14/2022: ALT 22; BUN 25; Creatinine, Ser 0.98; Hemoglobin 13.4; Platelets 369; Potassium 4.3; Sodium 133  Recent Lipid Panel    Component Value Date/Time   CHOL 184 09/18/2022 1102   CHOL 139 09/27/2020 1612   TRIG 173.0 (H) 09/18/2022 1102   HDL 56.00 09/18/2022 1102   HDL 48 09/27/2020 1612   CHOLHDL 3 09/18/2022 1102   VLDL 34.6 09/18/2022 1102   LDLCALC 93 09/18/2022 1102   LDLCALC  63 09/27/2020 1612   LDLCALC 140 (H) 06/14/2020 0000   LDLDIRECT 115.0 05/15/2022 0955    Physical Exam:    VS:  BP 134/87 (BP Location: Left Arm, Patient Position: Sitting, Cuff Size: Normal)   Pulse 79   Ht 5\' 1"  (1.549 m)   Wt 140 lb 14.4 oz (63.9 kg)   LMP 07/06/2008   SpO2 99%   BMI 26.62 kg/m     Wt Readings from Last 3 Encounters:  01/08/23 140 lb 14.4 oz (63.9 kg)  12/14/22 143 lb 6.4 oz (65 kg)  12/14/22 143 lb 6.4 oz (65 kg)     GEN:  Well nourished, well developed in no acute distress HEENT: Normal NECK: No JVD; No carotid bruits LYMPHATICS: No lymphadenopathy CARDIAC: RRR, no murmurs, rubs, gallops RESPIRATORY:  Clear to auscultation without rales, wheezing or rhonchi  ABDOMEN: Soft, non-tender, non-distended MUSCULOSKELETAL:  No edema; No deformity  SKIN: Warm and dry NEUROLOGIC:  Alert and oriented x 3 PSYCHIATRIC:  Normal affect   ASSESSMENT:    1. Precordial pain   2. Essential hypertension   3. Hyperlipidemia, unspecified hyperlipidemia type     PLAN:    Chest pain: Atypical in description but does occur when she is stressed and she does have CAD risk factors (age, hypertension, family history).  Calcium score on 10/10/2020 was 1 (53rd percentile).  Echocardiogram on 10/10/2020 showed normal biventricular function, and no significant valvular disease.Exercise Myoview 12/2020 showed good exercise capacity (9.3 METS), normal perfusion, EF 76%. -Reports recent worsening in chest pain.  Recommend coronary CTA for further evaluation.  Will give Lopressor 100 mg prior to study  Hypertension: Continue hydrochlorothiazide 25 mg daily  Hyperlipidemia: LDL 140 on 06/14/2020.  Started on rosuvastatin 10 mg daily, LDL 63 on 09/27/2020.  Reports she stopped taking rosuvastatin due to myalgias.  LDL 93 on 09/18/2022.  Start atorvastatin 20 mg daily  RTC in 4 months   Medication Adjustments/Labs and Tests Ordered: Current medicines are reviewed at length with the  patient today.  Concerns regarding medicines are outlined above.  Orders Placed This  Encounter  Procedures   CT CORONARY MORPH W/CTA COR W/SCORE W/CA W/CM &/OR WO/CM   Basic metabolic panel   No orders of the defined types were placed in this encounter.   There are no Patient Instructions on file for this visit.   Signed, Little Ishikawa, MD  01/08/2023 11:35 AM    [facility]

## 2023-01-08 ENCOUNTER — Encounter (HOSPITAL_BASED_OUTPATIENT_CLINIC_OR_DEPARTMENT_OTHER): Payer: Self-pay | Admitting: Cardiology

## 2023-01-08 ENCOUNTER — Ambulatory Visit (HOSPITAL_BASED_OUTPATIENT_CLINIC_OR_DEPARTMENT_OTHER): Payer: Medicare Other | Admitting: Cardiology

## 2023-01-08 VITALS — BP 134/87 | HR 79 | Ht 61.0 in | Wt 140.9 lb

## 2023-01-08 DIAGNOSIS — R072 Precordial pain: Secondary | ICD-10-CM | POA: Diagnosis not present

## 2023-01-08 DIAGNOSIS — I10 Essential (primary) hypertension: Secondary | ICD-10-CM | POA: Diagnosis not present

## 2023-01-08 DIAGNOSIS — E785 Hyperlipidemia, unspecified: Secondary | ICD-10-CM | POA: Diagnosis not present

## 2023-01-08 MED ORDER — METOPROLOL TARTRATE 100 MG PO TABS: ORAL_TABLET | ORAL | 0 refills | Status: DC

## 2023-01-08 MED ORDER — ATORVASTATIN CALCIUM 20 MG PO TABS: 20.0000 mg | ORAL_TABLET | Freq: Every day | ORAL | 3 refills | Status: DC

## 2023-01-08 NOTE — Patient Instructions (Addendum)
Medication Instructions:  Stop Rosuvastatin Start Atorvastatin 20 mg daily Continue all other medications *If you need a refill on your cardiac medications before your next appointment, please call your pharmacy*   Lab Work: Bmet,magnesium today   Testing/Procedures: Coronary CT will be scheduled after approved by insurance  Follow instructions below   Follow-Up: At [facility], you and your health needs are our priority.  As part of our continuing mission to provide you with exceptional heart care, we have created designated Provider Care Teams.  These Care Teams include your primary Cardiologist (physician) and Advanced Practice Providers (APPs -  Physician Assistants and Nurse Practitioners) who all work together to provide you with the care you need, when you need it.  We recommend signing up for the patient portal called "MyChart".  Sign up information is provided on this After Visit Summary.  MyChart is used to connect with patients for Virtual Visits (Telemedicine).  Patients are able to view lab/test results, encounter notes, upcoming appointments, etc.  Non-urgent messages can be sent to your provider as well.   To learn more about what you can do with MyChart, go to [email or web address].    Your next appointment:  4 months    Provider:  Dr.Schumann      Your cardiac CT will be scheduled at one of the below locations:   [facility] [address] [place], [place] [address] [phone]  OR  [facility] [address] [place], [place] [address] [phone]  OR   [facility] [address] [place], [place] [address] [phone]  If scheduled at [facility], please arrive at the [facility]'s Entrance (Entrance C2) of [facility] 30 minutes prior to test start time. You can use the FREE valet parking offered at entrance C (encouraged to  control the heart rate for the test)  Proceed to the [facility] Radiology Department (first floor) to check-in and test prep.  All radiology patients and guests should use entrance C2 at [facility], accessed from [facility], even though the hospital's physical address listed is [address].    If scheduled at [facility] or [facility], please arrive 15 mins early for check-in and test prep.   Please follow these instructions carefully (unless otherwise directed):  An IV will be required for this test and Nitroglycerin will be given.    On the Night Before the Test: Be sure to Drink plenty of water. Do not consume any caffeinated/decaffeinated beverages or chocolate 12 hours prior to your test. Do not take any antihistamines 12 hours prior to your test.   On the Day of the Test: Drink plenty of water until 1 hour prior to the test. Do not eat any food 1 hour prior to test. You may take your regular medications prior to the test.  Take metoprolol 100 mg  two hours prior to test. If you take Furosemide/Hydrochlorothiazide/Spironolactone, please HOLD on the morning of the test. FEMALES- please wear underwire-free bra if available, avoid dresses & tight clothing        After the Test: Drink plenty of water. After receiving IV contrast, you may experience a mild flushed feeling. This is normal. On occasion, you may experience a mild rash up to 24 hours after the test. This is not dangerous. If this occurs, you can take Benadryl 25 mg and increase your fluid intake.  If you experience trouble breathing, this can be serious. If it is severe call 911 IMMEDIATELY. If it is mild, please call our office. If you take any of these medications: Glipizide/Metformin, Avandament, Glucavance, please do not take 48 hours after completing test unless otherwise instructed.  We will call to schedule your test 2-4 weeks out  understanding that some insurance companies will need an authorization prior to the service being performed.   For more information and frequently asked questions, please visit our website : [email or web address]  For non-scheduling related questions, please contact the cardiac imaging nurse navigator should you have any questions/concerns: Rockwell Alexandria, Cardiac Imaging Nurse Navigator Larey Brick, Cardiac Imaging Nurse Navigator [facility] Direct Office Dial: [phone]   For scheduling needs, including cancellations and rescheduling, please call [place], [phone].

## 2023-01-09 LAB — BASIC METABOLIC PANEL
BUN/Creatinine Ratio: 30 — ABNORMAL HIGH (ref 12–28)
BUN: 25 mg/dL (ref 8–27)
CO2: 23 mmol/L (ref 20–29)
Calcium: 10.8 mg/dL — ABNORMAL HIGH (ref 8.7–10.3)
Chloride: 101 mmol/L (ref 96–106)
Creatinine, Ser: 0.84 mg/dL (ref 0.57–1.00)
Glucose: 126 mg/dL — ABNORMAL HIGH (ref 70–99)
Potassium: 4.5 mmol/L (ref 3.5–5.2)
Sodium: 142 mmol/L (ref 134–144)
eGFR: 76 mL/min/{1.73_m2} (ref >59)

## 2023-01-09 LAB — MAGNESIUM: Magnesium: 2.6 mg/dL — ABNORMAL HIGH (ref 1.6–2.3)

## 2023-01-18 ENCOUNTER — Telehealth (HOSPITAL_COMMUNITY): Payer: Self-pay | Admitting: *Deleted

## 2023-01-18 ENCOUNTER — Encounter (HOSPITAL_COMMUNITY): Payer: Self-pay

## 2023-01-18 NOTE — Telephone Encounter (Signed)
Attempted to call patient regarding upcoming cardiac CT appointment. Left message on voicemail with name and callback number Hayley Sharpe RN Navigator Cardiac Imaging [facility] [phone] Office   

## 2023-01-19 ENCOUNTER — Ambulatory Visit (HOSPITAL_COMMUNITY)
Admission: RE | Admit: 2023-01-19 | Discharge: 2023-01-19 | Disposition: A | Discharge status: Home / Self Care | Payer: Medicare Other | Source: Ambulatory Visit | Attending: Cardiology | Admitting: Cardiology

## 2023-01-19 DIAGNOSIS — R072 Precordial pain: Secondary | ICD-10-CM

## 2023-01-19 MED ORDER — NITROGLYCERIN 0.4 MG SL SUBL
0.8000 mg | SUBLINGUAL_TABLET | Freq: Once | SUBLINGUAL | Status: AC
  Administered 2023-01-19: 0.8 mg via SUBLINGUAL

## 2023-01-19 MED ORDER — IOHEXOL 350 MG/ML SOLN
95.0000 mL | Freq: Once | INTRAVENOUS | Status: AC | PRN
  Administered 2023-01-19: 95 mL via INTRAVENOUS

## 2023-01-19 MED ORDER — NITROGLYCERIN 0.4 MG SL SUBL
SUBLINGUAL_TABLET | SUBLINGUAL | Status: AC
  Filled 2023-01-19: qty 2

## 2023-01-19 NOTE — Progress Notes (Signed)
Patient tolerated CT well. Vital signs stable encourage to drink water throughout day.Reasons explained and verbalized understanding. Ambulated steady gait.   

## 2023-01-21 ENCOUNTER — Other Ambulatory Visit (HOSPITAL_COMMUNITY): Payer: Self-pay | Admitting: *Deleted

## 2023-01-21 DIAGNOSIS — E785 Hyperlipidemia, unspecified: Secondary | ICD-10-CM

## 2023-02-03 DIAGNOSIS — K080 Exfoliation of teeth due to systemic causes: Secondary | ICD-10-CM | POA: Diagnosis not present

## 2023-02-05 ENCOUNTER — Other Ambulatory Visit: Payer: Self-pay

## 2023-02-05 ENCOUNTER — Other Ambulatory Visit: Payer: Self-pay | Admitting: Cardiology

## 2023-02-05 ENCOUNTER — Other Ambulatory Visit: Payer: Self-pay | Admitting: Medical

## 2023-02-05 ENCOUNTER — Other Ambulatory Visit (HOSPITAL_COMMUNITY): Payer: Self-pay

## 2023-02-05 DIAGNOSIS — Z79899 Other long term (current) drug therapy: Secondary | ICD-10-CM

## 2023-02-05 DIAGNOSIS — G4700 Insomnia, unspecified: Secondary | ICD-10-CM

## 2023-02-05 MED ORDER — NITROGLYCERIN 0.4 MG SL SUBL
0.4000 mg | SUBLINGUAL_TABLET | SUBLINGUAL | 3 refills | Status: AC | PRN
  Filled 2023-02-05: qty 25, 7d supply, fill #0
  Filled 2023-04-18: qty 25, 8d supply, fill #0
  Filled 2023-09-19: qty 25, 8d supply, fill #1

## 2023-02-05 NOTE — Telephone Encounter (Signed)
Requesting: alprazolam 0.5mg   Contract:05/11/22 UDS:05/11/22 Last Visit: 12/14/22 Next Visit: None Last Refill: 12/15/22 #30 and 0RF   Please Advise

## 2023-02-06 MED ORDER — ALPRAZOLAM 0.5 MG PO TABS: 0.2500 mg | ORAL_TABLET | Freq: Every evening | ORAL | 0 refills | Status: DC

## 2023-02-06 NOTE — Telephone Encounter (Signed)
Rx refill sent  

## 2023-02-09 ENCOUNTER — Other Ambulatory Visit: Payer: Self-pay

## 2023-02-12 ENCOUNTER — Other Ambulatory Visit: Payer: Self-pay

## 2023-02-16 ENCOUNTER — Ambulatory Visit (INDEPENDENT_AMBULATORY_CARE_PROVIDER_SITE_OTHER): Payer: Medicare Other | Admitting: Medical

## 2023-02-16 ENCOUNTER — Other Ambulatory Visit: Payer: Self-pay | Admitting: Medical

## 2023-02-16 VITALS — BP 123/70 | HR 93 | Temp 98.0°F | Resp 16 | Ht 61.0 in | Wt 144.4 lb

## 2023-02-16 DIAGNOSIS — Z79899 Other long term (current) drug therapy: Secondary | ICD-10-CM

## 2023-02-16 DIAGNOSIS — M8580 Other specified disorders of bone density and structure, unspecified site: Secondary | ICD-10-CM

## 2023-02-16 DIAGNOSIS — R5383 Other fatigue: Secondary | ICD-10-CM | POA: Diagnosis not present

## 2023-02-16 DIAGNOSIS — G4700 Insomnia, unspecified: Secondary | ICD-10-CM

## 2023-02-16 NOTE — Patient Instructions (Signed)
Overweight Patient is planning to attend a weight management program run by a nurse practitioner. Patient is currently in the overweight category, not obese. -Encouraged patient's participation in the weight management program. Did explain contraindications to injectable med.   Fatigue Patient reports poor sleep quality and frequent awakenings. -Order metabolic panel, CBC, TSH, T4, and B12 level to investigate potential causes of fatigue.  Osteopenia Patient has a history of osteopenia. -Order Vitamin D level as requested by the weight management nurse practitioner and due to patient's history of osteopenia.  Prediabetes Patient has a history of prediabetes, but has never been in the diabetic range. -Order A1c to monitor blood sugar levels.  Anxiety and Insomnia Patient is currently on Xanax 0.5mg , 1-2 tablets as needed, but finds that 30 tablets per month is insufficient. -Increase Xanax prescription to 45 tablets per month. -Advised patient to notify the office 2-3 days before running out of medication for refill. -Reminded patient about the risk of dependence with Xanax and the importance of not exceeding the prescribed dose. -update oontract and uds.    Follow up 6 month or sooner if needed /based on lab results

## 2023-02-16 NOTE — Progress Notes (Signed)
Subjective:    Patient ID: Patty [name], female    DOB: 1955-11-24, 67 y.o.   MRN: [id number]  HPI Discussed the use of AI scribe software for clinical note transcription with the patient, who gave verbal consent to proceed.  History of Present Illness   The patient, with a history of anxiety, insomnia, and osteopenia, presents with concerns about weight management and fatigue. She reports constant tiredness, attributing it to poor sleep quality, as she frequently wakes up every few hours. She has enrolled in a weight management program run by a nurse practitioner, aiming for healthy weight loss. The patient has been in the prediabetic range but never in the diabetic range.  She is currently on Xanax 0.5, 1 to 2 tablets for anxiety and insomnia. She expresses concern about the adequacy of her current prescription, suggesting that 30 tablets per month may not be sufficient. She acknowledges the risk of dependency on the medication, indicating an understanding of the potential for increased tolerance over time. was on med 20 years before became pt of mine.  The patient has not had a vitamin D test in a long time, despite having osteopenia. She is seeking to have her blood work done, including TSH, vitamin D, and hemoglobin A1c, as requested by the nurse practitioner managing her weight loss program.        Review of Systems  Constitutional:        Overweight. Category.  HENT:  Negative for congestion.   Respiratory:  Negative for cough, chest tightness, shortness of breath and wheezing.   Cardiovascular:  Negative for chest pain and palpitations.  Gastrointestinal:  Negative for abdominal pain.  Genitourinary:  Negative for dysuria, flank pain and frequency.  Musculoskeletal:  Negative for back pain and joint swelling.  Skin:  Negative for rash.  Neurological:  Negative for dizziness and light-headedness.  Psychiatric/Behavioral:  Positive for sleep disturbance. Negative for behavioral  problems, dysphoric mood and suicidal ideas. The patient is nervous/anxious.    Past Medical History:  Diagnosis Date   Anxiety    Barrett's esophagus    Cardiomegaly    Dyspareunia    Fatty pancreas    Gastric ulcer    GERD (gastroesophageal reflux disease)    Hx of colonic polyps    Hyperlipidemia    Hypertension    Osteopenia    Osteoporosis    Shingles    Sleep apnea    Thyroid nodule    Uterine leiomyoma      Social History   Socioeconomic History   Marital status: Married    Spouse name: Not on file   Number of children: Not on file   Years of education: Not on file   Highest education level: Master's degree (e.g., MA, MS, MEng, MEd, MSW, MBA)  Occupational History   Not on file  Tobacco Use   Smoking status: Never   Smokeless tobacco: Never  Vaping Use   Vaping status: Never Used  Substance and Sexual Activity   Alcohol use: No    Alcohol/week: 0.0 standard drinks of alcohol   Drug use: No   Sexual activity: Yes    Partners: Male    Birth control/protection: Surgical    Comment: BTL  Other Topics Concern   Not on file  Social History Narrative   Not on file   Social Determinants of Health   Financial Resource Strain: Low Risk  (11/17/2022)   Overall Financial Resource Strain (CARDIA)    Difficulty of Paying  Living Expenses: Not hard at all  Food Insecurity: No Food Insecurity (11/17/2022)   Hunger Vital Sign    Worried About Running Out of Food in the Last Year: Never true    Ran Out of Food in the Last Year: Never true  Transportation Needs: No Transportation Needs (11/17/2022)   PRAPARE - Administrator, Civil Service (Medical): No    Lack of Transportation (Non-Medical): No  Physical Activity: Unknown (11/17/2022)   Exercise Vital Sign    Days of Exercise per Week: 0 days    Minutes of Exercise per Session: Not on file  Stress: No Stress Concern Present (11/17/2022)   [employer] of Occupational Health - Occupational Stress  Questionnaire    Feeling of Stress : Only a little  Social Connections: Socially Integrated (11/17/2022)   Social Connection and Isolation Panel [NHANES]    Frequency of Communication with Friends and Family: More than three times a week    Frequency of Social Gatherings with Friends and Family: More than three times a week    Attends Religious Services: More than 4 times per year    Active Member of Clubs or Organizations: Yes    Attends Banker Meetings: More than 4 times per year    Marital Status: Married  Catering manager Violence: Not At Risk (11/12/2021)   Humiliation, Afraid, Rape, and Kick questionnaire    Fear of Current or Ex-Partner: No    Emotionally Abused: No    Physically Abused: No    Sexually Abused: No    Past Surgical History:  Procedure Laterality Date   BIOPSY  07/04/2020   Procedure: BIOPSY;  Surgeon: Meridee Score, Netty Starring., MD;  Location: [facility] ENDOSCOPY;  Service: Gastroenterology;;   BIOPSY THYROID     COLONOSCOPY W/ POLYPECTOMY     ESOPHAGOGASTRODUODENOSCOPY (EGD) WITH PROPOFOL N/A 07/04/2020   Procedure: ESOPHAGOGASTRODUODENOSCOPY (EGD) WITH PROPOFOL;  Surgeon: Lemar Lofty., MD;  Location: [facility] ENDOSCOPY;  Service: Gastroenterology;  Laterality: N/A;   TUBAL LIGATION     UPPER ESOPHAGEAL ENDOSCOPIC ULTRASOUND (EUS) N/A 07/04/2020   Procedure: UPPER ESOPHAGEAL ENDOSCOPIC ULTRASOUND (EUS);  Surgeon: Lemar Lofty., MD;  Location: [facility] ENDOSCOPY;  Service: Gastroenterology;  Laterality: N/A;    Family History  Problem Relation Age of Onset   Hypertension Mother    Heart disease Mother    Heart disease Father    Hypertension Brother    Colon cancer Neg Hx    Esophageal cancer Neg Hx    Pancreatic cancer Neg Hx    Stomach cancer Neg Hx    Liver disease Neg Hx    Inflammatory bowel disease Neg Hx    Rectal cancer Neg Hx     No Known Allergies  Current Outpatient Medications on File Prior to Visit  Medication Sig Dispense  Refill   alendronate (FOSAMAX) 10 MG tablet Take 1 tablet (10 mg total) by mouth daily before breakfast. Take with a full glass of water on an empty stomach. 30 tablet 11   ALPRAZolam (XANAX) 0.5 MG tablet Take 0.5-1 tablets (0.25-0.5 mg total) by mouth every evening for insomnia 30 tablet 0   atorvastatin (LIPITOR) 20 MG tablet Take 1 tablet (20 mg total) by mouth daily. 90 tablet 3   famotidine (PEPCID) 20 MG tablet Take 1 tablet (20 mg total) by mouth 2 (two) times daily. 180 tablet 0   fenofibrate (TRICOR) 145 MG tablet Take 1 tablet (145 mg total) by mouth daily. 100 tablet 2  fluticasone (FLONASE) 50 MCG/ACT nasal spray Place 2 sprays into both nostrils daily. 16 g 6   hydrochlorothiazide (HYDRODIURIL) 25 MG tablet Take 1 tablet (25 mg total) by mouth daily. 100 tablet 2   metoprolol tartrate (LOPRESSOR) 100 MG tablet Take 100 mg 2 hours before Coronary CT 1 tablet 0   nitroGLYCERIN (NITROSTAT) 0.4 MG SL tablet Place 1 tablet (0.4 mg total) under the tongue every 5 (five) minutes as needed. 25 tablet 3   Current Facility-Administered Medications on File Prior to Visit  Medication Dose Route Frequency Provider Last Rate Last Admin   0.9 %  sodium chloride infusion  500 mL Intravenous Once Mansouraty, Netty Starring., MD       nitroGLYCERIN (NITROSTAT) SL tablet 0.4 mg  0.4 mg Sublingual Q5 min PRN Little Ishikawa, MD   0.4 mg at 09/27/20 1550    BP 123/70 (BP Location: Left Arm, Patient Position: Sitting, Cuff Size: Normal)   Pulse 93   Temp 98 F (36.7 C) (Oral)   Resp 16   Ht 5\' 1"  (1.549 m)   Wt 144 lb 6.4 oz (65.5 kg)   LMP 07/06/2008   SpO2 100%   BMI 27.28 kg/m        Objective:   Physical Exam  General Mental Status- Alert. General Appearance- Not in acute distress.   Skin General: Color- Normal Color. Moisture- Normal Moisture.  Neck Carotid Arteries- Normal color. Moisture- Normal Moisture. No carotid bruits. No JVD.  Chest and Lung  Exam Auscultation: Breath Sounds:-Normal.  Cardiovascular Auscultation:Rythm- Regular. Murmurs & Other Heart Sounds:Auscultation of the heart reveals- No Murmurs.  Abdomen Inspection:-Inspeection Normal. Palpation/Percussion:Note:No mass. Palpation and Percussion of the abdomen reveal- Non Tender, Non Distended + BS, no rebound or guarding.  Neurologic Cranial Nerve exam:- CN III-XII intact(No nystagmus), symmetric smile. Strength:- 5/5 equal and symmetric strength both upper and lower extremities.       Assessment & Plan:  Assessment and Plan    Overweight Patient is planning to attend a weight management program run by a nurse practitioner. Patient is currently in the overweight category, not obese. -Encouraged patient's participation in the weight management program. Did explain contraindications to injectable med.   Fatigue Patient reports poor sleep quality and frequent awakenings. -Order metabolic panel, CBC, TSH, T4, and B12 level to investigate potential causes of fatigue.  Osteopenia Patient has a history of osteopenia. -Order Vitamin D level as requested by the weight management nurse practitioner and due to patient's history of osteopenia.  Prediabetes Patient has a history of prediabetes, but has never been in the diabetic range. -Order A1c to monitor blood sugar levels.  Anxiety and Insomnia Patient is currently on Xanax 0.5mg , 1-2 tablets as needed, but finds that 30 tablets per month is insufficient. -Increase Xanax prescription to 45 tablets per month. -Advised patient to notify the office 2-3 days before running out of medication for refill. -Reminded patient about the risk of dependence with Xanax and the importance of not exceeding the prescribed dose. -update oontract and uds.    Follow up 6 month or sooner if needed /based on lab results   [employer], PA-C

## 2023-02-17 DIAGNOSIS — K080 Exfoliation of teeth due to systemic causes: Secondary | ICD-10-CM | POA: Diagnosis not present

## 2023-03-15 ENCOUNTER — Other Ambulatory Visit: Payer: Self-pay | Admitting: Medical

## 2023-03-15 DIAGNOSIS — K080 Exfoliation of teeth due to systemic causes: Secondary | ICD-10-CM | POA: Diagnosis not present

## 2023-03-15 DIAGNOSIS — Z79899 Other long term (current) drug therapy: Secondary | ICD-10-CM

## 2023-03-15 DIAGNOSIS — G4700 Insomnia, unspecified: Secondary | ICD-10-CM

## 2023-03-15 MED ORDER — ALPRAZOLAM 0.5 MG PO TABS: 0.2500 mg | ORAL_TABLET | Freq: Every evening | ORAL | 2 refills | Status: DC

## 2023-03-15 NOTE — Telephone Encounter (Signed)
Requesting: alprazolam 0.5mg   Contract:  02/16/23 UDS: 02/16/23 Last Visit: 02/16/23 Next Visit: None Last Refill: 02/06/23 #30 and 0RF   Please Advise

## 2023-03-15 NOTE — Telephone Encounter (Signed)
Rx refill sent to pt pharmacy 

## 2023-04-07 DIAGNOSIS — K080 Exfoliation of teeth due to systemic causes: Secondary | ICD-10-CM | POA: Diagnosis not present

## 2023-04-09 DIAGNOSIS — M170 Bilateral primary osteoarthritis of knee: Secondary | ICD-10-CM | POA: Diagnosis not present

## 2023-04-16 DIAGNOSIS — M170 Bilateral primary osteoarthritis of knee: Secondary | ICD-10-CM | POA: Diagnosis not present

## 2023-04-18 ENCOUNTER — Other Ambulatory Visit (HOSPITAL_COMMUNITY): Payer: Self-pay

## 2023-04-19 ENCOUNTER — Other Ambulatory Visit: Payer: Self-pay

## 2023-04-19 ENCOUNTER — Other Ambulatory Visit: Payer: Self-pay | Admitting: Medical

## 2023-04-19 DIAGNOSIS — E781 Pure hyperglyceridemia: Secondary | ICD-10-CM

## 2023-04-22 ENCOUNTER — Other Ambulatory Visit: Payer: Self-pay | Admitting: Cardiology

## 2023-04-22 DIAGNOSIS — E785 Hyperlipidemia, unspecified: Secondary | ICD-10-CM | POA: Diagnosis not present

## 2023-04-23 DIAGNOSIS — M170 Bilateral primary osteoarthritis of knee: Secondary | ICD-10-CM | POA: Diagnosis not present

## 2023-04-23 LAB — LIPID PANEL
Chol/HDL Ratio: 5.2 {ratio} — ABNORMAL HIGH (ref 0.0–4.4)
Cholesterol, Total: 237 mg/dL — ABNORMAL HIGH (ref 100–199)
HDL: 46 mg/dL (ref >39)
LDL Chol Calc (NIH): 139 mg/dL — ABNORMAL HIGH (ref 0–99)
Triglycerides: 286 mg/dL — ABNORMAL HIGH (ref 0–149)
VLDL Cholesterol Cal: 52 mg/dL — ABNORMAL HIGH (ref 5–40)

## 2023-04-30 ENCOUNTER — Other Ambulatory Visit: Payer: Self-pay | Admitting: Medical

## 2023-04-30 ENCOUNTER — Other Ambulatory Visit: Payer: Self-pay | Admitting: *Deleted

## 2023-04-30 DIAGNOSIS — E785 Hyperlipidemia, unspecified: Secondary | ICD-10-CM

## 2023-04-30 DIAGNOSIS — Z1231 Encounter for screening mammogram for malignant neoplasm of breast: Secondary | ICD-10-CM

## 2023-05-03 DIAGNOSIS — H04123 Dry eye syndrome of bilateral lacrimal glands: Secondary | ICD-10-CM | POA: Diagnosis not present

## 2023-05-03 DIAGNOSIS — H31013 Macula scars of posterior pole (postinflammatory) (post-traumatic), bilateral: Secondary | ICD-10-CM | POA: Diagnosis not present

## 2023-05-03 DIAGNOSIS — H25813 Combined forms of age-related cataract, bilateral: Secondary | ICD-10-CM | POA: Diagnosis not present

## 2023-05-03 DIAGNOSIS — H524 Presbyopia: Secondary | ICD-10-CM | POA: Diagnosis not present

## 2023-05-03 DIAGNOSIS — H40013 Open angle with borderline findings, low risk, bilateral: Secondary | ICD-10-CM | POA: Diagnosis not present

## 2023-05-13 ENCOUNTER — Ambulatory Visit: Payer: Medicare Other | Attending: Cardiology | Admitting: Pharmacist

## 2023-05-13 ENCOUNTER — Telehealth: Payer: Self-pay | Admitting: Pharmacist

## 2023-05-13 ENCOUNTER — Encounter: Payer: Self-pay | Admitting: Pharmacist

## 2023-05-13 DIAGNOSIS — R931 Abnormal findings on diagnostic imaging of heart and coronary circulation: Secondary | ICD-10-CM

## 2023-05-13 DIAGNOSIS — E785 Hyperlipidemia, unspecified: Secondary | ICD-10-CM | POA: Diagnosis not present

## 2023-05-13 DIAGNOSIS — Z8249 Family history of ischemic heart disease and other diseases of the circulatory system: Secondary | ICD-10-CM

## 2023-05-13 DIAGNOSIS — M791 Myalgia, unspecified site: Secondary | ICD-10-CM

## 2023-05-13 DIAGNOSIS — I700 Atherosclerosis of aorta: Secondary | ICD-10-CM | POA: Diagnosis not present

## 2023-05-13 DIAGNOSIS — T466X5D Adverse effect of antihyperlipidemic and antiarteriosclerotic drugs, subsequent encounter: Secondary | ICD-10-CM

## 2023-05-13 NOTE — Progress Notes (Signed)
Patient ID: Patty [name]                 DOB: 07-16-1955                    MRN: [id number]     HPI: Patty [name] is a 67 y.o. female patient referred to lipid clinic by Dr Bjorn Pippin. PMH is significant for HTN, CAD, aortic atherosclerosis, elevated coronary calcium score, and a family history of CAD. Patient experiencing statin related myalgias.  Patient presents today to discuss lipid management. Previously on rosuvastatin which was discontinued due to muscle pain. Switched to atorvastatin which she is only able to take 3x weekly. Still causing muscle pains.  Family history includes heart disease in both parents and brothers.  Current Medications:  Atorvastatin 20mg  3x weekly Fenofibrate 145mg   Intolerances: Atorvastatin Rosuvastatin    Risk Factors:  CAD HTN Elevated coronary calcium score Family history  LDL goal: <70  Labs: TC 237, Trigs 286, HDL 46, LDL 139 (04/22/23)  Imaging: 1. Coronary calcium score of 59.5. This was 75 percentile for age-,sex, and race-matched controls.   2. Normal coronary origin with right dominance.   3. Minimal plaque in the mid LAD and mid RCA.   4. Aortic atherosclerosis.  Past Medical History:  Diagnosis Date   Anxiety    Barrett's esophagus    Cardiomegaly    Dyspareunia    Fatty pancreas    Gastric ulcer    GERD (gastroesophageal reflux disease)    Hx of colonic polyps    Hyperlipidemia    Hypertension    Osteopenia    Osteoporosis    Shingles    Sleep apnea    Thyroid nodule    Uterine leiomyoma     Current Outpatient Medications on File Prior to Visit  Medication Sig Dispense Refill   alendronate (FOSAMAX) 10 MG tablet Take 1 tablet (10 mg total) by mouth daily before breakfast. Take with a full glass of water on an empty stomach. 30 tablet 11   ALPRAZolam (XANAX) 0.5 MG tablet Take 0.5-1 tablets (0.25-0.5 mg total) by mouth every evening for insomnia 30 tablet 2   atorvastatin (LIPITOR) 20 MG tablet Take 1 tablet (20  mg total) by mouth daily. 90 tablet 3   famotidine (PEPCID) 20 MG tablet Take 1 tablet (20 mg total) by mouth 2 (two) times daily. 180 tablet 0   fenofibrate (TRICOR) 145 MG tablet Take 1 tablet (145 mg total) by mouth daily. 90 tablet 1   fluticasone (FLONASE) 50 MCG/ACT nasal spray Place 2 sprays into both nostrils daily. 16 g 6   hydrochlorothiazide (HYDRODIURIL) 25 MG tablet Take 1 tablet (25 mg total) by mouth daily. 90 tablet 1   metoprolol tartrate (LOPRESSOR) 100 MG tablet Take 100 mg 2 hours before Coronary CT 1 tablet 0   nitroGLYCERIN (NITROSTAT) 0.4 MG SL tablet Place 1 tablet (0.4 mg total) under the tongue every 5 (five) minutes as needed. 25 tablet 3   Current Facility-Administered Medications on File Prior to Visit  Medication Dose Route Frequency Provider Last Rate Last Admin   0.9 %  sodium chloride infusion  500 mL Intravenous Once Mansouraty, Netty Starring., MD       nitroGLYCERIN (NITROSTAT) SL tablet 0.4 mg  0.4 mg Sublingual Q5 min PRN Little Ishikawa, MD   0.4 mg at 09/27/20 1550    No Known Allergies  Assessment/Plan:  1. Hyperlipidemia - Patient's last LDL 139 is greater  than goal of <70. Since patient unable to tolerate atorvastatin more than 3x weekly, recommend starting PCSK9i due to coronary calcium and family history.  Using demo pen, educated patient on mechanism of action, storage, site selection, administration, and possible adverse effects. Patient able to demonstrate in room. Will complete PA and contact patient when approved. Recheck lipid panel in 2-3 months.  Stop atorvastatin Continue fenofibrate 145mg  daily Start Repatha 140mg  q 2 weeks Recheck lipid panel in 2-3 months  Laural Golden, PharmD, BCACP, CDCES, CPP [address] [place], [place], [address] Phone: [phone], Fax: [phone]

## 2023-05-13 NOTE — Patient Instructions (Signed)
It was nice meeting you today  We would like your LDL (bad cholesterol) to be less than 70  The medication we discussed today is called Repatha, which is an injection you would take once every 2 weeks  I will complete the prior authorization and contact you when it is approved  Once you start the medication we will recheck your cholesterol levels in 2-3 months  You can stop your atorvastatin at this time  Please call or message with any questions  Laural Golden, PharmD, BCACP, CDCES, CPP [address] [place], [place], [address] Phone: [phone], Fax: [phone]

## 2023-05-13 NOTE — Telephone Encounter (Signed)
Please complete PA for Repatha 

## 2023-05-14 ENCOUNTER — Telehealth: Payer: Self-pay | Admitting: Pharmacy Technician

## 2023-05-14 ENCOUNTER — Other Ambulatory Visit (HOSPITAL_COMMUNITY): Payer: Self-pay

## 2023-05-14 DIAGNOSIS — R931 Abnormal findings on diagnostic imaging of heart and coronary circulation: Secondary | ICD-10-CM

## 2023-05-14 DIAGNOSIS — I700 Atherosclerosis of aorta: Secondary | ICD-10-CM

## 2023-05-14 DIAGNOSIS — E785 Hyperlipidemia, unspecified: Secondary | ICD-10-CM

## 2023-05-14 NOTE — Telephone Encounter (Signed)
Pharmacy Patient Advocate Encounter   Received notification from Pt Calls Messages that prior authorization for repatha is required/requested.   Insurance verification completed.   The patient is insured through [facility] .   Per test claim: PA required; PA submitted to above mentioned insurance via CoverMyMeds Key/confirmation #/EOC [facility] Status is pending

## 2023-05-18 ENCOUNTER — Ambulatory Visit: Payer: Medicare Other | Admitting: Cardiology

## 2023-05-18 ENCOUNTER — Other Ambulatory Visit (HOSPITAL_COMMUNITY): Payer: Self-pay

## 2023-05-20 ENCOUNTER — Other Ambulatory Visit (HOSPITAL_COMMUNITY): Payer: Self-pay

## 2023-05-20 MED ORDER — REPATHA SURECLICK 140 MG/ML ~~LOC~~ SOAJ
1.0000 mL | SUBCUTANEOUS | 1 refills | Status: DC
Start: 2023-05-20 — End: 2023-10-19

## 2023-05-20 NOTE — Addendum Note (Signed)
Addended by: Cheree Ditto on: 05/20/2023 08:45 AM   Modules accepted: Orders

## 2023-05-20 NOTE — Telephone Encounter (Signed)
Pharmacy Patient Advocate Encounter  Received notification from [facility] that Prior Authorization for repatha has been APPROVED from 05/19/23 to 05/18/24. Ran test claim, Copay is $45.00- one month. This test claim was processed through [facility]- copay amounts may vary at other pharmacies due to pharmacy/plan contracts, or as the patient moves through the different stages of their insurance plan.   PA #/Case ID/Reference #: [id number]

## 2023-05-25 ENCOUNTER — Ambulatory Visit
Admission: RE | Admit: 2023-05-25 | Discharge: 2023-05-25 | Disposition: A | Discharge status: Home / Self Care | Payer: Medicare Other | Source: Ambulatory Visit | Attending: Medical | Admitting: Medical

## 2023-05-25 DIAGNOSIS — Z1231 Encounter for screening mammogram for malignant neoplasm of breast: Secondary | ICD-10-CM | POA: Diagnosis not present

## 2023-06-08 ENCOUNTER — Ambulatory Visit (INDEPENDENT_AMBULATORY_CARE_PROVIDER_SITE_OTHER): Payer: Medicare Other | Admitting: Medical

## 2023-06-08 VITALS — BP 136/76 | HR 80 | Resp 18 | Ht 61.0 in | Wt 148.0 lb

## 2023-06-08 DIAGNOSIS — Z79899 Other long term (current) drug therapy: Secondary | ICD-10-CM

## 2023-06-08 DIAGNOSIS — I10 Essential (primary) hypertension: Secondary | ICD-10-CM

## 2023-06-08 DIAGNOSIS — E781 Pure hyperglyceridemia: Secondary | ICD-10-CM

## 2023-06-08 DIAGNOSIS — R5383 Other fatigue: Secondary | ICD-10-CM

## 2023-06-08 DIAGNOSIS — R739 Hyperglycemia, unspecified: Secondary | ICD-10-CM

## 2023-06-08 LAB — CBC WITH DIFFERENTIAL/PLATELET
Basophils Absolute: 0.1 10*3/uL (ref 0.0–0.1)
Basophils Relative: 0.8 % (ref 0.0–3.0)
Eosinophils Absolute: 0.2 10*3/uL (ref 0.0–0.7)
Eosinophils Relative: 3.2 % (ref 0.0–5.0)
HCT: 42 % (ref 36.0–46.0)
Hemoglobin: 13.9 g/dL (ref 12.0–15.0)
Lymphocytes Relative: 32.8 % (ref 12.0–46.0)
Lymphs Abs: 2.1 10*3/uL (ref 0.7–4.0)
MCHC: 33 g/dL (ref 30.0–36.0)
MCV: 90.4 fL (ref 78.0–100.0)
Monocytes Absolute: 0.7 10*3/uL (ref 0.1–1.0)
Monocytes Relative: 10.7 % (ref 3.0–12.0)
Neutro Abs: 3.4 10*3/uL (ref 1.4–7.7)
Neutrophils Relative %: 52.5 % (ref 43.0–77.0)
Platelets: 394 10*3/uL (ref 150.0–400.0)
RBC: 4.64 Mil/uL (ref 3.87–5.11)
RDW: 12.9 % (ref 11.5–15.5)
WBC: 6.5 10*3/uL (ref 4.0–10.5)

## 2023-06-08 LAB — COMPREHENSIVE METABOLIC PANEL
ALT: 20 U/L (ref 0–35)
AST: 15 U/L (ref 0–37)
Albumin: 4.7 g/dL (ref 3.5–5.2)
Alkaline Phosphatase: 105 U/L (ref 39–117)
BUN: 20 mg/dL (ref 6–23)
CO2: 32 meq/L (ref 19–32)
Calcium: 10.8 mg/dL — ABNORMAL HIGH (ref 8.4–10.5)
Chloride: 103 meq/L (ref 96–112)
Creatinine, Ser: 0.83 mg/dL (ref 0.40–1.20)
GFR: 72.52 mL/min (ref >60.00)
Glucose, Bld: 108 mg/dL — ABNORMAL HIGH (ref 70–99)
Potassium: 4.2 meq/L (ref 3.5–5.1)
Sodium: 142 meq/L (ref 135–145)
Total Bilirubin: 0.5 mg/dL (ref 0.2–1.2)
Total Protein: 7.3 g/dL (ref 6.0–8.3)

## 2023-06-08 LAB — VITAMIN B12: Vitamin B-12: 992 pg/mL — ABNORMAL HIGH (ref 211–911)

## 2023-06-08 LAB — HEMOGLOBIN A1C: Hgb A1c MFr Bld: 5.9 % (ref 4.6–6.5)

## 2023-06-08 NOTE — Patient Instructions (Signed)
Hypertension. Controlled on Hydrochlorothiazide 25mg  daily. -Continue Hydrochlorothiazide 25mg  daily.  Hyperlipidemia High cholesterol level on previous visit, switched from Atorvastatin to Repatha by cardiologist. -Continue Repatha as per cardiologist's advice. -Repeat cholesterol level in 2-3 months as advised by cardiologist.  Prediabetes Previous blood sugar level in prediabetic range. -Repeat metabolic panel today to check current blood sugar level.  Anxiety and Insomnia On Xanax 0.5mg  daily, reduced from 2 tablets to 1 tablet at night. Added Melatonin 5mg . -Continue Xanax 0.5mg  daily and Melatonin 5mg . -Consider increasing Melatonin up to 10mg  if needed. -Due for update contract and uds.  Osteopenia On Fosamax,  -Discuss with dentist during upcoming appointment tomorrrow his opinion on you taking fosamax. -Continue Fosamax unless advised otherwise by dentist.  Low Back Pain Degenerative changes noted on previous imaging, pain improved with massage and exercise. -Continue conservative management with heat pad, Salonpas, occasional Tylenol and low dose Ibuprofen. -Consider referral to sports medicine or orthopedist if pain worsens.  Follow up date to be determined after lab review.

## 2023-06-08 NOTE — Progress Notes (Signed)
Subjective:    Patient ID: Patty [name], female    DOB: 1956-01-05, 67 y.o.   MRN: [id number]  HPI  Pt in for appt prompted by her insurance. She has medicare and is 67 yo. Pt is not new to medicare.    Pt has htn, high cholesterol, elevated sugar, anxiety, insomnia and osteopenia.   .Discussed the use of AI scribe software for clinical note transcription with the patient, who gave verbal consent to proceed.  History of Present Illness   The patient, with a history of hypertension, hyperlipidemia, prediabetes, anxiety, insomnia, and osteopenia, reports good control of her blood pressure on hydrochlorothiazide 25mg  daily. She recently had a change in her cholesterol management, switching from atorvastatin to Repatha on the advice of her cardiologist. She is due for a cholesterol level recheck in a few months.  The patient's blood sugar was previously in the prediabetic range, and she is open to rechecking her sugar average. She has been managing her anxiety and insomnia with Xanax 0.5mg , taken nightly. She has reduced her dosage from two to one tablet nightly and has supplemented with over-the-counter melatonin 5mg , which she finds helpful.  She continues to manage her osteopenia with Fosamax, but expresses concerns about potential side effects.  The patient also reports intermittent low back pain, which she describes as improving with massage therapy. She has a history of degenerative changes in her back and disc space narrowing at L2-L3, but denies any radicular symptoms. She manages her pain with occasional use of Motrin and heat pads, and is considering further consultation with sports medicine or orthopedics if the pain worsens.       Review of Systems See hpi    Objective:   Physical Exam  General Appearance- Not in acute distress.  Neck- no neck stiffness. No jvd.  Chest and Lung Exam Auscultation: Breath sounds:-Normal. Clear even and unlabored. Adventitious sounds:- No  Adventitious sounds.  Cardiovascular Auscultation:Rythm - Regular, rate and rythm. Heart Sounds -Normal heart sounds.  Abdomen Inspection:-Inspection Normal.  Palpation/Perucssion: Palpation and Percussion of the abdomen reveal- Non Tender, No Rebound tenderness, No rigidity(Guarding) and No Palpable abdominal masses.  Liver:-Normal.  Spleen:- Normal.   Back Mid lumbar spine tenderness to palpation. Pain on straight leg lift. Pain on lateral movements and flexion/extension of the spine.  Lower ext neurologic  L5-S1 sensation intact bilaterally. Normal patellar reflexes bilaterally. No foot drop bilaterally.       Assessment & Plan:  Hypertension. Controlled on Hydrochlorothiazide 25mg  daily. -Continue Hydrochlorothiazide 25mg  daily.  Hyperlipidemia High cholesterol level on previous visit, switched from Atorvastatin to Repatha by cardiologist. -Continue Repatha as per cardiologist's advice. -Repeat cholesterol level in 2-3 months as advised by cardiologist.  Prediabetes Previous blood sugar level in prediabetic range. -Repeat metabolic panel today to check current blood sugar level.  Anxiety and Insomnia On Xanax 0.5mg  daily, reduced from 2 tablets to 1 tablet at night. Added Melatonin 5mg . -Continue Xanax 0.5mg  daily and Melatonin 5mg . -Consider increasing Melatonin up to 10mg  if needed. -Due for update contract and uds.  Osteopenia On Fosamax,  -Discuss with dentist during upcoming appointment tomorrrow his opinion on you taking fosamax. -Continue Fosamax unless advised otherwise by dentist.  Low Back Pain Degenerative changes noted on previous imaging, pain improved with massage and exercise. -Continue conservative management with heat pad, Salonpas, occasional Tylenol and low dose Ibuprofen. -Consider referral to sports medicine or orthopedist if pain worsens.  Follow up date to be determined after lab review.  Time spent with patient today was 40 minutes  which consisted of chart review, discussing diagnosis, work up ,treatment and documentation.

## 2023-06-09 DIAGNOSIS — K080 Exfoliation of teeth due to systemic causes: Secondary | ICD-10-CM | POA: Diagnosis not present

## 2023-06-09 LAB — DRUG MONITORING PANEL 376104, URINE
Amphetamines: NEGATIVE ng/mL (ref <500)
Barbiturates: NEGATIVE ng/mL (ref <300)
Benzodiazepines: NEGATIVE ng/mL (ref <100)
Cocaine Metabolite: NEGATIVE ng/mL (ref <150)
Desmethyltramadol: NEGATIVE ng/mL (ref <100)
Opiates: NEGATIVE ng/mL (ref <100)
Oxycodone: NEGATIVE ng/mL (ref <100)
Tramadol: NEGATIVE ng/mL (ref <100)

## 2023-06-09 LAB — DM TEMPLATE

## 2023-06-09 NOTE — Addendum Note (Signed)
Addended by: Gwenevere Abbot on: 06/09/2023 08:34 PM   Modules accepted: Orders

## 2023-06-22 DIAGNOSIS — H524 Presbyopia: Secondary | ICD-10-CM | POA: Diagnosis not present

## 2023-07-20 ENCOUNTER — Encounter: Payer: Self-pay | Admitting: Pharmacist

## 2023-07-21 ENCOUNTER — Other Ambulatory Visit: Payer: Self-pay | Admitting: Pharmacist

## 2023-07-21 DIAGNOSIS — I700 Atherosclerosis of aorta: Secondary | ICD-10-CM

## 2023-07-21 DIAGNOSIS — R931 Abnormal findings on diagnostic imaging of heart and coronary circulation: Secondary | ICD-10-CM

## 2023-07-21 DIAGNOSIS — E785 Hyperlipidemia, unspecified: Secondary | ICD-10-CM

## 2023-09-13 DIAGNOSIS — M545 Low back pain, unspecified: Secondary | ICD-10-CM | POA: Diagnosis not present

## 2023-09-13 DIAGNOSIS — M170 Bilateral primary osteoarthritis of knee: Secondary | ICD-10-CM | POA: Diagnosis not present

## 2023-09-15 ENCOUNTER — Other Ambulatory Visit: Payer: Self-pay | Admitting: Medical

## 2023-09-15 DIAGNOSIS — E781 Pure hyperglyceridemia: Secondary | ICD-10-CM

## 2023-09-19 ENCOUNTER — Other Ambulatory Visit: Payer: Self-pay | Admitting: Medical

## 2023-09-20 ENCOUNTER — Other Ambulatory Visit (HOSPITAL_COMMUNITY): Payer: Self-pay

## 2023-09-20 ENCOUNTER — Other Ambulatory Visit: Payer: Self-pay

## 2023-09-20 MED ORDER — FAMOTIDINE 20 MG PO TABS
20.0000 mg | ORAL_TABLET | Freq: Two times a day (BID) | ORAL | 0 refills | Status: AC
  Filled 2023-09-20: qty 180, 90d supply, fill #0

## 2023-10-01 DIAGNOSIS — M545 Low back pain, unspecified: Secondary | ICD-10-CM | POA: Diagnosis not present

## 2023-10-12 DIAGNOSIS — K080 Exfoliation of teeth due to systemic causes: Secondary | ICD-10-CM | POA: Diagnosis not present

## 2023-10-19 ENCOUNTER — Other Ambulatory Visit: Payer: Self-pay | Admitting: Cardiology

## 2023-10-19 DIAGNOSIS — E785 Hyperlipidemia, unspecified: Secondary | ICD-10-CM

## 2023-10-19 DIAGNOSIS — I700 Atherosclerosis of aorta: Secondary | ICD-10-CM

## 2023-10-19 DIAGNOSIS — R931 Abnormal findings on diagnostic imaging of heart and coronary circulation: Secondary | ICD-10-CM

## 2023-11-03 ENCOUNTER — Other Ambulatory Visit: Payer: Self-pay | Admitting: Medical

## 2023-11-03 ENCOUNTER — Ambulatory Visit: Admitting: Medical

## 2023-11-03 VITALS — BP 108/66 | HR 88 | Resp 18 | Ht 61.0 in | Wt 144.0 lb

## 2023-11-03 DIAGNOSIS — R7989 Other specified abnormal findings of blood chemistry: Secondary | ICD-10-CM | POA: Diagnosis not present

## 2023-11-03 DIAGNOSIS — E781 Pure hyperglyceridemia: Secondary | ICD-10-CM | POA: Diagnosis not present

## 2023-11-03 DIAGNOSIS — G4700 Insomnia, unspecified: Secondary | ICD-10-CM

## 2023-11-03 DIAGNOSIS — I10 Essential (primary) hypertension: Secondary | ICD-10-CM | POA: Diagnosis not present

## 2023-11-03 DIAGNOSIS — R5383 Other fatigue: Secondary | ICD-10-CM

## 2023-11-03 DIAGNOSIS — R739 Hyperglycemia, unspecified: Secondary | ICD-10-CM

## 2023-11-03 DIAGNOSIS — E785 Hyperlipidemia, unspecified: Secondary | ICD-10-CM

## 2023-11-03 DIAGNOSIS — Z79899 Other long term (current) drug therapy: Secondary | ICD-10-CM

## 2023-11-03 MED ORDER — MELOXICAM 7.5 MG PO TABS: 7.5000 mg | ORAL_TABLET | Freq: Every day | ORAL | 0 refills | Status: AC

## 2023-11-03 MED ORDER — ALPRAZOLAM 0.5 MG PO TABS: 0.2500 mg | ORAL_TABLET | Freq: Every evening | ORAL | 2 refills | Status: AC

## 2023-11-03 NOTE — Progress Notes (Signed)
   Subjective:    Patient ID: Patty [name], female    DOB: [birth year], 68 y.o.   MRN: [id number]  HPI Patty [name] "Patty [name]" is a 68 year old female who presents with fatigue and sleep disturbances.  She has been experiencing fatigue for many months, with a notable worsening over the past two months. Despite sleeping for long periods, she often wakes up multiple times during the night, contributing to her persistent tiredness.  In December 2024, her lab results showed no anemia, normal kidney function, slightly elevated sugar levels, and high B12 levels. She stopped taking B12 supplements after the high levels were noted. She is currently not taking any iron supplements and has no history of low B12 levels.  She is on Repatha  for high cholesterol, prescribed by her cardiologist, and fenofibrate  for triglycerides. She stopped taking atorvastatin . Her lipid levels have not been checked since starting Repatha .  Her blood pressure is usually around 120/80 mmHg, but a recent reading was 108/66 mmHg, which she feels is lower than usual. She checks her blood pressure occasionally and reports it is normally around 120/80 mmHg.  She has a history of back pain for over two years, with degenerative changes noted in 2023, including grade one retrolisthesis and anterolisthesis, and disc space narrowing. She uses meloxicam  7.5 mg as needed for pain, which she states lasts her a long time as she tries not to use it daily.  She also has a history of anxiety and insomnia, previously managed with Xanax , which she took as needed. Her prescription expired in March, and she is considering resuming it due to upcoming travel to [place].   Review of Systems See  hpi    Objective:   Physical Exam  General Mental Status- Alert. General Appearance- Not in acute distress.   Skin General: Color- Normal Color. Moisture- Normal Moisture.  Neck  No JVD.  Chest and Lung Exam Auscultation: Breath  Sounds:-CTA  Cardiovascular Auscultation:Rythm- RRR Murmurs & Other Heart Sounds:Auscultation of the heart reveals- No Murmurs.  Abdomen Inspection:-Inspeection Normal. Palpation/Percussion:Note:No mass. Palpation and Percussion of the abdomen reveal- Non Tender, Non Distended + BS, no rebound or guarding.   Neurologic Cranial Nerve exam:- CN III-XII intact(No nystagmus), symmetric smile. Strength:- 5/5 equal and symmetric strength both upper and lower extremities.       Assessment & Plan:   Patient Instructions  Fatigue Persistent fatigue with differential diagnosis including vitamin deficiencies, thyroid  dysfunction, and possible iron overload. - Order CBC, metabolic panel, TSH, T4, B12, B1, and iron panel.  Elevated calcium  - Order PTH level.  Elevated ferritin. -get iron panel. Note not on iron  Anxiety and insomnia Anxiety and insomnia managed with Xanax , especially needed for upcoming travel. - Refill Xanax  prescription for night use.  Back pain with degenerative changes Chronic back pain with degenerative changes, managed with meloxicam . - Refill meloxicam  prescription. -if pain worsens let us  now. Recent xray done thru ortho. -May need referral back if pain worsens.  Hyperlipidemia On Repatha  and fenofibrate , lipid levels need assessment for treatment efficacy. - Order lipid panel.  Htn- -bp on recheck 125/75. -continue hctz 25 mg daily  Follow up date to be determined after lab review.

## 2023-11-03 NOTE — Patient Instructions (Signed)
 Fatigue Persistent fatigue with differential diagnosis including vitamin deficiencies, thyroid  dysfunction, and possible iron overload. - Order CBC, metabolic panel, TSH, T4, B12, B1, and iron panel.  Elevated calcium  - Order PTH level.  Elevated ferritin. -get iron panel. Note not on iron  Anxiety and insomnia Anxiety and insomnia managed with Xanax , especially needed for upcoming travel. - Refill Xanax  prescription for night use.  Back pain with degenerative changes Chronic back pain with degenerative changes, managed with meloxicam . - Refill meloxicam  prescription. -if pain worsens let us  now. Recent xray done thru ortho. -May need referral back if pain worsens.  Hyperlipidemia On Repatha  and fenofibrate , lipid levels need assessment for treatment efficacy. - Order lipid panel.  Htn- -bp on recheck 125/75. -continue hctz 25 mg daily  Follow up date to be determined after lab review.

## 2023-11-04 ENCOUNTER — Telehealth: Payer: Self-pay | Admitting: *Deleted

## 2023-11-04 LAB — CBC WITH DIFFERENTIAL/PLATELET
Basophils Absolute: 0.1 10*3/uL (ref 0.0–0.1)
Basophils Relative: 0.9 % (ref 0.0–3.0)
Eosinophils Absolute: 0.1 10*3/uL (ref 0.0–0.7)
Eosinophils Relative: 1.3 % (ref 0.0–5.0)
HCT: 43.5 % (ref 36.0–46.0)
Hemoglobin: 14.8 g/dL (ref 12.0–15.0)
Lymphocytes Relative: 21.5 % (ref 12.0–46.0)
Lymphs Abs: 1.9 10*3/uL (ref 0.7–4.0)
MCHC: 33.9 g/dL (ref 30.0–36.0)
MCV: 90 fl (ref 78.0–100.0)
Monocytes Absolute: 0.6 10*3/uL (ref 0.1–1.0)
Monocytes Relative: 6.8 % (ref 3.0–12.0)
Neutro Abs: 6.2 10*3/uL (ref 1.4–7.7)
Neutrophils Relative %: 69.5 % (ref 43.0–77.0)
Platelets: 496 10*3/uL — ABNORMAL HIGH (ref 150.0–400.0)
RBC: 4.84 Mil/uL (ref 3.87–5.11)
RDW: 13.8 % (ref 11.5–15.5)
WBC: 8.9 10*3/uL (ref 4.0–10.5)

## 2023-11-04 LAB — COMPREHENSIVE METABOLIC PANEL WITH GFR
ALT: 23 U/L (ref 0–35)
AST: 18 U/L (ref 0–37)
Albumin: 4.8 g/dL (ref 3.5–5.2)
Alkaline Phosphatase: 107 U/L (ref 39–117)
BUN: 24 mg/dL — ABNORMAL HIGH (ref 6–23)
CO2: 27 meq/L (ref 19–32)
Calcium: 10.3 mg/dL (ref 8.4–10.5)
Chloride: 100 meq/L (ref 96–112)
Creatinine, Ser: 0.76 mg/dL (ref 0.40–1.20)
GFR: 80.38 mL/min (ref >60.00)
Glucose, Bld: 139 mg/dL — ABNORMAL HIGH (ref 70–99)
Potassium: 4.6 meq/L (ref 3.5–5.1)
Sodium: 139 meq/L (ref 135–145)
Total Bilirubin: 0.4 mg/dL (ref 0.2–1.2)
Total Protein: 7.2 g/dL (ref 6.0–8.3)

## 2023-11-04 LAB — LIPID PANEL
Cholesterol: 141 mg/dL (ref 0–200)
HDL: 60.3 mg/dL (ref >39.00)
NonHDL: 81.04
Total CHOL/HDL Ratio: 2
Triglycerides: 522 mg/dL — ABNORMAL HIGH (ref 0.0–149.0)
VLDL: 104.4 mg/dL — ABNORMAL HIGH (ref 0.0–40.0)

## 2023-11-04 LAB — LDL CHOLESTEROL, DIRECT: Direct LDL: 47 mg/dL

## 2023-11-04 LAB — T4, FREE: Free T4: 1 ng/dL (ref 0.60–1.60)

## 2023-11-04 LAB — TSH: TSH: 1.2 u[IU]/mL (ref 0.35–5.50)

## 2023-11-04 LAB — VITAMIN B12: Vitamin B-12: 728 pg/mL (ref 211–911)

## 2023-11-04 NOTE — Telephone Encounter (Signed)
 Pt had labs drawn yesterday. Orders were cancelled after labs were taken.  Spoke with PCP today and verified ok to proceed with initial orders.  Re-entered orders for Patty [name].  Called [facility] and spoke with client services rep, Jyl Or.  She states the PTH and calcium  are in progress, they did note that an sst was received for the IBC ferritin and frozen serum was received for the pth and calcium .  They did note that frozen plasma was received but it did not say light protected.  She is going to have them pull specimen and verify transport tube. If they did receive light protected tube they will proceed with Vit B1 testing.

## 2023-11-04 NOTE — Telephone Encounter (Signed)
 Received call back from Tiffany and she verified a light protected plasma tube was received.  They will add the B1 on and complete testing.

## 2023-11-04 NOTE — Addendum Note (Signed)
 Addended by: Susa Engman A on: 11/04/2023 09:53 AM   Modules accepted: Orders

## 2023-11-07 ENCOUNTER — Encounter: Payer: Self-pay | Admitting: Medical

## 2023-11-09 ENCOUNTER — Encounter: Payer: Self-pay | Admitting: Medical

## 2023-11-09 NOTE — Addendum Note (Signed)
 Addended by: Serafina Damme on: 11/09/2023 06:05 AM   Modules accepted: Orders

## 2023-11-10 ENCOUNTER — Encounter: Payer: Self-pay | Admitting: *Deleted

## 2023-11-10 ENCOUNTER — Other Ambulatory Visit: Payer: Self-pay | Admitting: *Deleted

## 2023-11-10 DIAGNOSIS — R739 Hyperglycemia, unspecified: Secondary | ICD-10-CM

## 2023-11-10 DIAGNOSIS — E781 Pure hyperglyceridemia: Secondary | ICD-10-CM

## 2023-11-10 DIAGNOSIS — E785 Hyperlipidemia, unspecified: Secondary | ICD-10-CM

## 2023-11-10 LAB — IRON,TIBC AND FERRITIN PANEL
%SAT: 21 % (ref 16–45)
Ferritin: 154 ng/mL (ref 16–288)
Iron: 77 ug/dL (ref 45–160)
TIBC: 367 ug/dL (ref 250–450)

## 2023-11-10 LAB — VITAMIN B1: Vitamin B1 (Thiamine): 796 nmol/L — ABNORMAL HIGH (ref 8–30)

## 2023-11-10 LAB — HOUSE ACCOUNT TRACKING

## 2023-11-10 LAB — PTH, INTACT AND CALCIUM
Calcium: 10.5 mg/dL — ABNORMAL HIGH (ref 8.6–10.4)
Calcium: 10.5 mg/dL — ABNORMAL HIGH (ref 8.6–10.4)
PTH: 28 pg/mL (ref 16–77)

## 2023-11-11 DIAGNOSIS — E785 Hyperlipidemia, unspecified: Secondary | ICD-10-CM | POA: Diagnosis not present

## 2023-11-11 DIAGNOSIS — R739 Hyperglycemia, unspecified: Secondary | ICD-10-CM | POA: Diagnosis not present

## 2023-11-11 DIAGNOSIS — E781 Pure hyperglyceridemia: Secondary | ICD-10-CM | POA: Diagnosis not present

## 2023-11-12 LAB — LIPID PANEL
Chol/HDL Ratio: 2.4 ratio (ref 0.0–4.4)
Cholesterol, Total: 131 mg/dL (ref 100–199)
HDL: 55 mg/dL (ref >39)
LDL Chol Calc (NIH): 59 mg/dL (ref 0–99)
Triglycerides: 89 mg/dL (ref 0–149)
VLDL Cholesterol Cal: 17 mg/dL (ref 5–40)

## 2023-11-15 ENCOUNTER — Encounter: Payer: Self-pay | Admitting: *Deleted

## 2023-11-16 DIAGNOSIS — H04123 Dry eye syndrome of bilateral lacrimal glands: Secondary | ICD-10-CM | POA: Diagnosis not present

## 2023-11-16 DIAGNOSIS — H40013 Open angle with borderline findings, low risk, bilateral: Secondary | ICD-10-CM | POA: Diagnosis not present

## 2023-11-16 DIAGNOSIS — H00022 Hordeolum internum right lower eyelid: Secondary | ICD-10-CM | POA: Diagnosis not present

## 2023-11-22 DIAGNOSIS — K080 Exfoliation of teeth due to systemic causes: Secondary | ICD-10-CM | POA: Diagnosis not present

## 2023-12-22 DIAGNOSIS — K080 Exfoliation of teeth due to systemic causes: Secondary | ICD-10-CM | POA: Diagnosis not present

## 2023-12-30 NOTE — Progress Notes (Signed)
 [facility] Quality Team Note  Name: Patty [name] Date of Birth: [birth year] MRN: [id number] Date: 12/30/2023  [facility] Quality Team has reviewed this patient's chart, please see recommendations below:  [facility] Quality Other; (CHART REVIEWED FOR CONTROLLING BLOOD PRESSURE GAP CLOSURE. ABSTRACTED MOST RECENT, PATIENT WILL NEED ANOTHER QUALIFYING BP BEFORE END OF 2025)

## 2024-01-12 ENCOUNTER — Other Ambulatory Visit: Payer: Self-pay | Admitting: Medical

## 2024-01-12 DIAGNOSIS — E781 Pure hyperglyceridemia: Secondary | ICD-10-CM

## 2024-03-01 ENCOUNTER — Telehealth: Payer: Self-pay | Admitting: Medical

## 2024-03-01 NOTE — Telephone Encounter (Signed)
 Copied from CRM [id number]. Topic: Medicare AWV >> Mar 01, 2024  1:16 PM Nathanel DEL wrote: Reason for CRM: Called LVM 03/01/2024 to schedule AWV. Please schedule office or virtual visits.  Nathanel Paschal; Care Guide Ambulatory Clinical Support [place] [facility] Direct Dial: [phone]

## 2024-03-10 ENCOUNTER — Ambulatory Visit (INDEPENDENT_AMBULATORY_CARE_PROVIDER_SITE_OTHER): Admitting: *Deleted

## 2024-03-10 VITALS — BP 136/90 | HR 102 | Temp 98.2°F | Resp 16 | Ht 61.0 in | Wt 142.4 lb

## 2024-03-10 DIAGNOSIS — Z23 Encounter for immunization: Secondary | ICD-10-CM

## 2024-03-10 DIAGNOSIS — Z0000 Encounter for general adult medical examination without abnormal findings: Secondary | ICD-10-CM

## 2024-03-10 NOTE — Progress Notes (Signed)
 Subjective:   Patty [name] is a 68 y.o. who presents for a Medicare Wellness preventive visit.  As a reminder, Annual Wellness Visits don't include a physical exam, and some assessments may be limited, especially if this visit is performed virtually. We may recommend an in-person follow-up visit with your provider if needed.  Visit Complete: In person  Persons Participating in Visit: Patient.  AWV Questionnaire: No: Patient Medicare AWV questionnaire was not completed prior to this visit.  Cardiac Risk Factors include: advanced age (>10men, >23 women);dyslipidemia;hypertension;Other (see comment), Risk factor comments: aortic athersclerosis     Objective:    Today's Vitals   03/10/24 1339 03/10/24 1345 03/10/24 1406  BP: (!) 139/98  (!) 136/90  Pulse: 100  (!) 102  Resp: 16    Temp: 98.2 F (36.8 C)    TempSrc: Oral    SpO2: 99%    Weight: 142 lb 6.4 oz (64.6 kg)    Height: 5' 1 (1.549 m)    PainSc:  7     Body mass index is 26.91 kg/m.     03/10/2024    2:29 PM 12/14/2022    3:01 PM 11/12/2021    3:03 PM 09/08/2021    2:09 PM 07/04/2020   10:03 AM 03/18/2020    2:29 PM 03/05/2020    5:35 PM  Advanced Directives  Does Patient Have a Medical Advance Directive? Yes No Yes No No No No  Type of Estate agent of [place];Living will  Healthcare Power of [place];Out of facility DNR (pink MOST or yellow form);Living will      Does patient want to make changes to medical advance directive? Yes (ED - Information included in AVS)        Copy of Healthcare Power of Attorney in Chart? No - copy requested  No - copy requested      Would patient like information on creating a medical advance directive?  No - Patient declined No - Patient declined Yes (MAU/Ambulatory/Procedural Areas - Information given)  No - Patient declined No - Patient declined    Current Medications (verified) Outpatient Encounter Medications as of 03/10/2024  Medication Sig   alendronate   (FOSAMAX ) 10 MG tablet Take 1 tablet (10 mg total) by mouth daily before breakfast. Take with a full glass of water on an empty stomach.   ALPRAZolam  (XANAX ) 0.5 MG tablet Take 0.5-1 tablets (0.25-0.5 mg total) by mouth every evening. 0.5- 1 tab po q hs prn anxiety or insomnia   Evolocumab  (REPATHA  SURECLICK) 140 MG/ML SOAJ INJECT 140MG  INTO THE SKIN EVERY 14 DAYS   famotidine  (PEPCID ) 20 MG tablet Take 1 tablet (20 mg total) by mouth 2 (two) times daily.   fenofibrate  (TRICOR ) 145 MG tablet Take 1 tablet (145 mg total) by mouth daily.   fluticasone  (FLONASE ) 50 MCG/ACT nasal spray Place 2 sprays into both nostrils daily.   hydrochlorothiazide  (HYDRODIURIL ) 25 MG tablet Take 1 tablet (25 mg total) by mouth daily.   meloxicam  (MOBIC ) 7.5 MG tablet Take 1 tablet (7.5 mg total) by mouth daily.   nitroGLYCERIN  (NITROSTAT ) 0.4 MG SL tablet Place 1 tablet (0.4 mg total) under the tongue every 5 (five) minutes as needed.   Facility-Administered Encounter Medications as of 03/10/2024  Medication   0.9 %  sodium chloride  infusion   nitroGLYCERIN  (NITROSTAT ) SL tablet 0.4 mg    Allergies (verified) Patient has no known allergies.   History: Past Medical History:  Diagnosis Date   Anxiety  Barrett's esophagus    Cardiomegaly    Dyspareunia    Fatty pancreas    Gastric ulcer    GERD (gastroesophageal reflux disease)    Hx of colonic polyps    Hyperlipidemia    Hypertension    Osteopenia    Osteoporosis    Shingles    Sleep apnea    Thyroid  nodule    Uterine leiomyoma    Past Surgical History:  Procedure Laterality Date   BIOPSY  07/04/2020   Procedure: BIOPSY;  Surgeon: Wilhelmenia Aloha Raddle., MD;  Location: [facility] ENDOSCOPY;  Service: Gastroenterology;;   BIOPSY THYROID      COLONOSCOPY W/ POLYPECTOMY     ESOPHAGOGASTRODUODENOSCOPY (EGD) WITH PROPOFOL  N/A 07/04/2020   Procedure: ESOPHAGOGASTRODUODENOSCOPY (EGD) WITH PROPOFOL ;  Surgeon: Wilhelmenia Aloha Raddle., MD;  Location: [facility]  ENDOSCOPY;  Service: Gastroenterology;  Laterality: N/A;   TUBAL LIGATION     UPPER ESOPHAGEAL ENDOSCOPIC ULTRASOUND (EUS) N/A 07/04/2020   Procedure: UPPER ESOPHAGEAL ENDOSCOPIC ULTRASOUND (EUS);  Surgeon: Wilhelmenia Aloha Raddle., MD;  Location: [facility] ENDOSCOPY;  Service: Gastroenterology;  Laterality: N/A;   Family History  Problem Relation Age of Onset   Hypertension Mother    Heart disease Mother    Heart disease Father    Hypertension Brother    Colon cancer Neg Hx    Esophageal cancer Neg Hx    Pancreatic cancer Neg Hx    Stomach cancer Neg Hx    Liver disease Neg Hx    Inflammatory bowel disease Neg Hx    Rectal cancer Neg Hx    Social History   Socioeconomic History   Marital status: Married    Spouse name: Not on file   Number of children: Not on file   Years of education: Not on file   Highest education level: Master's degree (e.g., MA, MS, MEng, MEd, MSW, MBA)  Occupational History   Not on file  Tobacco Use   Smoking status: Never   Smokeless tobacco: Never  Vaping Use   Vaping status: Never Used  Substance and Sexual Activity   Alcohol use: No    Alcohol/week: 0.0 standard drinks of alcohol   Drug use: No   Sexual activity: Yes    Partners: Male    Birth control/protection: Surgical    Comment: BTL  Other Topics Concern   Not on file  Social History Narrative   Not on file   Social Drivers of Health   Financial Resource Strain: Low Risk  (03/10/2024)   Overall Financial Resource Strain (CARDIA)    Difficulty of Paying Living Expenses: Not very hard  Food Insecurity: No Food Insecurity (03/10/2024)   Hunger Vital Sign    Worried About Running Out of Food in the Last Year: Never true    Ran Out of Food in the Last Year: Never true  Transportation Needs: No Transportation Needs (03/10/2024)   PRAPARE - Administrator, Civil Service (Medical): No    Lack of Transportation (Non-Medical): No  Physical Activity: Insufficiently Active (03/10/2024)    Exercise Vital Sign    Days of Exercise per Week: 5 days    Minutes of Exercise per Session: 20 min  Stress: No Stress Concern Present (03/10/2024)   [employer] of Occupational Health - Occupational Stress Questionnaire    Feeling of Stress: Not at all  Social Connections: Socially Integrated (03/10/2024)   Social Connection and Isolation Panel    Frequency of Communication with Friends and Family: More than three times a week  Frequency of Social Gatherings with Friends and Family: More than three times a week    Attends Religious Services: More than 4 times per year    Active Member of Clubs or Organizations: Yes    Attends Engineer, structural: More than 4 times per year    Marital Status: Married    Tobacco Counseling Counseling given: Not Answered    Clinical Intake:  Pre-visit preparation completed: Yes  Pain : 0-10 Pain Score: 7  Pain Type: Chronic pain Pain Location: Back Pain Orientation: Lower Pain Radiating Towards: has lower back pain and neck pain for years. Pain Onset: More than a month ago Pain Frequency: Constant Pain Relieving Factors: Massage, takes motrin occasionally  Pain Relieving Factors: Massage, takes motrin occasionally  BMI - recorded: 26.91 Nutritional Status: BMI 25 -29 Overweight Nutritional Risks: None Diabetes: No  Lab Results  Component Value Date   HGBA1C 5.9 06/08/2023   HGBA1C 5.7 02/16/2023   HGBA1C 5.8 09/18/2022     How often do you need to have someone help you when you read instructions, pamphlets, or other written materials from your doctor or pharmacy?: 1 - Never What is the last grade level you completed in school?: Master's degree  Interpreter Needed?: No  Information entered by :: Lolita Libra, CMA   Activities of Daily Living     03/10/2024    2:27 PM  In your present state of health, do you have any difficulty performing the following activities:  Hearing? 0  Vision? 0  Difficulty  concentrating or making decisions? 0  Walking or climbing stairs? 0  Dressing or bathing? 0  Doing errands, shopping? 0  Preparing Food and eating ? N  Using the Toilet? N  In the past six months, have you accidently leaked urine? N  Do you have problems with loss of bowel control? N  Managing your Medications? N  Managing your Finances? N  Housekeeping or managing your Housekeeping? N    Patient Care Team: Saguier, Edward, PA-C as PCP - General (Internal Medicine)  I have updated your Care Teams any recent Medical Services you may have received from other providers in the past year.     Assessment:   This is a routine wellness examination for [place].  Hearing/Vision screen Hearing Screening - Comments:: Denies hearing difficulties.  Vision Screening - Comments:: Wears RX glasses -- up to date with routine eye exams at [facility].    Goals Addressed   None    Depression Screen     03/10/2024    1:46 PM 06/08/2023   10:03 AM 02/16/2023    1:18 PM 11/18/2022    3:03 PM 11/12/2021    3:04 PM 03/25/2021   11:03 AM 03/18/2020    2:29 PM  PHQ 2/9 Scores  PHQ - 2 Score 0 0 0 0 0 0 0  PHQ- 9 Score 6      0    Fall Risk     03/10/2024    1:44 PM 06/08/2023   10:02 AM 02/16/2023    1:18 PM 11/18/2022    3:03 PM 11/12/2021    3:03 PM  Fall Risk   Falls in the past year? 0 0 0 0 0  Number falls in past yr: 0 0 0 0 0  Injury with Fall? 0 0 0 0 0  Risk for fall due to : No Fall Risks No Fall Risks  No Fall Risks No Fall Risks  Follow up Education provided  Falls evaluation completed;Education provided Falls evaluation completed Falls evaluation completed;Education provided Falls evaluation completed      Data saved with a previous flowsheet row definition    MEDICARE RISK AT HOME:  Medicare Risk at Home Any stairs in or around the home?: Yes If so, are there any without handrails?: No Adequate lighting in your home to reduce risk of falls?: Yes Life alert?: No Use of a cane,  walker or w/c?: No Grab bars in the bathroom?: Yes Shower chair or bench in shower?: Yes Elevated toilet seat or a handicapped toilet?: No  TIMED UP AND GO:  Was the test performed?  Yes  Length of time to ambulate 10 feet: 5 sec Gait slow and steady without use of assistive device  Cognitive Function: 6CIT completed        03/10/2024    1:51 PM 11/12/2021    3:07 PM  6CIT Screen  What Year? 0 points 0 points  What month? 0 points 0 points  What time? 0 points 0 points  Count back from 20 0 points 0 points  Months in reverse 0 points 0 points  Repeat phrase 0 points 0 points  Total Score 0 points 0 points    Immunizations Immunization History  Administered Date(s) Administered   Janssen (J&J) SARS-COV-2 Vaccination 10/07/2019   PNEUMOCOCCAL CONJUGATE-20 09/18/2022   Td 02/08/2004    Screening Tests Health Maintenance  Topic Date Due   Zoster Vaccines- Shingrix (1 of 2) Never done   DTaP/Tdap/Td (2 - Tdap) 02/07/2014   Influenza Vaccine  Never done   COVID-19 Vaccine (2 - Janssen risk series) 03/10/2025 (Originally 11/04/2019)   Medicare Annual Wellness (AWV)  03/10/2025   MAMMOGRAM  05/24/2025   Colonoscopy  06/05/2029   Pneumococcal Vaccine: 50+ Years  Completed   DEXA SCAN  Completed   Hepatitis C Screening  Completed   HPV VACCINES  Aged Out   Meningococcal B Vaccine  Aged Out    Health Maintenance Items Addressed: Received flu vaccine today. Will get tetanus vaccine at pharmacy. Declines mammogram.  Additional Screening:  Vision Screening: Recommended annual ophthalmology exams for early detection of glaucoma and other disorders of the eye. Is the patient up to date with their annual eye exam?  Yes  Who is the provider or what is the name of the office in which the patient attends annual eye exams? [facility]  Dental Screening: Recommended annual dental exams for proper oral hygiene  Community Resource Referral / Chronic Care Management: CRR  required this visit?  No   CCM required this visit?  No   Plan:    I have personally reviewed and noted the following in the patient's chart:   Medical and social history Use of alcohol, tobacco or illicit drugs  Current medications and supplements including opioid prescriptions. Patient is not currently taking opioid prescriptions. Functional ability and status Nutritional status Physical activity Advanced directives List of other physicians Hospitalizations, surgeries, and ER visits in previous 12 months Vitals Screenings to include cognitive, depression, and falls Referrals and appointments  In addition, I have reviewed and discussed with patient certain preventive protocols, quality metrics, and best practice recommendations. A written personalized care plan for preventive services as well as general preventive health recommendations were provided to patient.   Lolita Libra, CMA   03/10/2024   After Visit Summary: (In Person-Printed) AVS printed and given to the patient  Notes: Nothing significant to report at this time.

## 2024-03-10 NOTE — Patient Instructions (Addendum)
 Ms. Broadfoot , Thank you for taking time out of your busy schedule to complete your Annual Wellness Visit with me. I enjoyed our conversation and look forward to speaking with you again next year. I, as well as your care team,  appreciate your ongoing commitment to your health goals. Please review the following plan we discussed and let me know if I can assist you in the future. Your Game plan/ To Do List    Follow up Visits: Next Medicare AWV with our clinical staff: 03/14/25 1:40pm, telephone.    Next Office Visit with your provider: 03/14/24 10:20am, [place]. Bring your blood pressure machine from home to this visit.  Clinician Recommendations:  Aim for 30 minutes of exercise or brisk walking, 6-8 glasses of water, and 5 servings of fruits and vegetables each day.   You will need to get the following vaccines at your local pharmacy:  tetanus      This is a list of the screening recommended for you and due dates:  Health Maintenance  Topic Date Due   Zoster (Shingles) Vaccine (1 of 2) Never done   DTaP/Tdap/Td vaccine (2 - Tdap) 02/07/2014   Medicare Annual Wellness Visit  11/13/2022   Flu Shot  Never done   COVID-19 Vaccine (2 - Janssen risk series) 03/10/2025*   Mammogram  05/24/2025   Colon Cancer Screening  06/05/2029   Pneumococcal Vaccine for age over 49  Completed   DEXA scan (bone density measurement)  Completed   Hepatitis C Screening  Completed   HPV Vaccine  Aged Out   Meningitis B Vaccine  Aged Out  *Topic was postponed. The date shown is not the original due date.    Advanced directives: (Provided) Advance directive discussed with you today. I have provided a copy for you to complete at home and have notarized. Once this is complete, please bring a copy in to our office so we can scan it into your chart.  Advance Care Planning is important because it:  [x]  Makes sure you receive the medical care that is consistent with your values, goals, and preferences  [x]  It provides  guidance to your family and loved ones and reduces their decisional burden about whether or not they are making the right decisions based on your wishes.  Follow the link provided in your after visit summary or read over the paperwork we have mailed to you to help you started getting your Advance Directives in place. If you need assistance in completing these, please reach out to us  so that we can help you!  See attachments for Preventive Care and Fall Prevention Tips.

## 2024-03-14 ENCOUNTER — Ambulatory Visit (INDEPENDENT_AMBULATORY_CARE_PROVIDER_SITE_OTHER): Admitting: Medical

## 2024-03-14 VITALS — BP 134/80 | HR 91 | Temp 98.4°F | Resp 17 | Ht 61.0 in | Wt 144.4 lb

## 2024-03-14 DIAGNOSIS — E785 Hyperlipidemia, unspecified: Secondary | ICD-10-CM

## 2024-03-14 DIAGNOSIS — R079 Chest pain, unspecified: Secondary | ICD-10-CM

## 2024-03-14 DIAGNOSIS — F439 Reaction to severe stress, unspecified: Secondary | ICD-10-CM

## 2024-03-14 DIAGNOSIS — I10 Essential (primary) hypertension: Secondary | ICD-10-CM | POA: Diagnosis not present

## 2024-03-14 NOTE — Patient Instructions (Addendum)
 Chest pain. Intermittent chest pain linked to emotional stress but constant pain described since this morning.. Family history of cardiovascular issues. Recent coronary calcium  score in the 75th percentile. EKG normal no acute ischemic changes. similar to  july 2024 - Consult emergency department for further evaluation. - Order serial troponin tests. With hx of troponin elevation in past under similar stressful circumstance do think set would be indicated. -also strong family hx of CAD. Dad passed away from MI at age 78.  Hypertension Blood pressure elevated due to emotional stress briefly after death of brother. Current medication is hydrochlorothiazide  25 mg. Bp well controlled now. Discussed management during high readings. - Advise to monitor blood pressure regularly. - Instruct to wait 10 minutes and recheck if elevated. - Discuss potential need for additional medication if consistently elevated.  Hyperlipidemia On Repatha  with satisfactory lipid levels. Family history of cardiovascular disease.  Stress recent and history of anxiety. -pt has rx of xanax .  Notify ED MD of pt presentation prior to sending down.  Follow up date to be determined after lab and imaging review

## 2024-03-14 NOTE — Progress Notes (Signed)
 Subjective:    Patient ID: Patty [name], female    DOB: 08/29/1955, 68 y.o.   MRN: [id number]  HPI Patty [name] is a 68 year old female with hypertension who presents with chest pain.  She has been experiencing intermittent, low-level chest pain since 31-Mar-2024, following the sudden death of her brother from an aortic dissection. The pain is described as coming and going, and she is unsure if it is muscular in nature. The pain has been more prominent this morning and still persists now on exam. She reports shortness of breath, which she describes as 'kind of always,' event before chest pain onset and notes her pulse is typically around 80 in the morning. No recent jaw pain, left arm pain, nausea, vomiting, or episodes of sweating. She experiences shoulder pain and tension.  She has a history of elevated blood pressure readings, particularly during times of emotional stress. Recently, her blood pressure was recorded at 167/105 with a heart rate over 100 when she was upset. She is currently taking hydrochlorothiazide  25 mg for hypertension. She also has a history of elevated blood pressure when visiting [place], where she was prescribed additional medication, though she does not take it regularly.  In 2022, she had a CT of the abdomen and pelvis due to right lower quadrant pain, which showed no evidence of aneurysm or adenopathy. At that time, she experienced chest pain and elevated troponin levels following the suicide of a friend, which led to an emergency department visit. She was previously prescribed nitroglycerin , which she used a couple of times in 2022.  She has a significant family history of cardiovascular issues, including two brothers who died from aortic aneurysms and a third brother who had a stroke. Her father also passed away at 43, and her family has a history of smoking.   Review of Systems See hpi    Objective:   Physical Exam  General- No acute distress. Pleasant  patient. Neck- Full range of motion, no jvd Lungs- Clear, even and unlabored. Heart- regular rate and rhythm. Neurologic- CNII- XII grossly intact.       Assessment & Plan:   Patient Instructions  Chest pain. Intermittent chest pain linked to emotional stress but constant pain described since this morning.. Family history of cardiovascular issues. Recent coronary calcium  score in the 75th percentile. EKG normal no acute ischemic changes. similar to  july 2024 - Consult emergency department for further evaluation. - Order serial troponin tests. With hx of troponin elevation in past under similar stressful circumstance do think set would be indicated. -also strong family hx of CAD. Dad passed away from MI at age 37.  Hypertension Blood pressure elevated due to emotional stress briefly after death of brother. Current medication is hydrochlorothiazide  25 mg. Bp well controlled now. Discussed management during high readings. - Advise to monitor blood pressure regularly. - Instruct to wait 10 minutes and recheck if elevated. - Discuss potential need for additional medication if consistently elevated.  Hyperlipidemia On Repatha  with satisfactory lipid levels. Family history of cardiovascular disease.  Stress recent and history of anxiety. -pt has rx of xanax .  Notify ED MD of pt presentation prior to sending down.   Follow up date to be determined after lab and imaging review  Dallas Maxwell, PA-C   I personally spent a total of 45 minutes in the care of the patient today including performing a medically appropriate exam/evaluation, counseling and educating, referring and communicating with other health care professionals,  documenting clinical information in the EHR, communicating results, and coordinating care. Total time did not inlcude EKG time.

## 2024-04-24 ENCOUNTER — Other Ambulatory Visit: Payer: Self-pay | Admitting: Medical

## 2024-04-24 DIAGNOSIS — E781 Pure hyperglyceridemia: Secondary | ICD-10-CM

## 2024-05-02 ENCOUNTER — Ambulatory Visit (INDEPENDENT_AMBULATORY_CARE_PROVIDER_SITE_OTHER): Admitting: Medical

## 2024-05-02 ENCOUNTER — Encounter: Payer: Self-pay | Admitting: Medical

## 2024-05-02 VITALS — BP 137/87 | HR 94 | Temp 98.0°F | Resp 18 | Ht 61.0 in | Wt 142.0 lb

## 2024-05-02 DIAGNOSIS — R5383 Other fatigue: Secondary | ICD-10-CM

## 2024-05-02 DIAGNOSIS — E785 Hyperlipidemia, unspecified: Secondary | ICD-10-CM

## 2024-05-02 DIAGNOSIS — I10 Essential (primary) hypertension: Secondary | ICD-10-CM | POA: Diagnosis not present

## 2024-05-02 DIAGNOSIS — R519 Headache, unspecified: Secondary | ICD-10-CM

## 2024-05-02 DIAGNOSIS — R739 Hyperglycemia, unspecified: Secondary | ICD-10-CM | POA: Diagnosis not present

## 2024-05-02 DIAGNOSIS — R931 Abnormal findings on diagnostic imaging of heart and coronary circulation: Secondary | ICD-10-CM

## 2024-05-02 MED ORDER — KETOROLAC TROMETHAMINE 30 MG/ML IJ SOLN
30.0000 mg | Freq: Once | INTRAMUSCULAR | Status: AC
  Administered 2024-05-02: 30 mg via INTRAMUSCULAR

## 2024-05-02 NOTE — Addendum Note (Signed)
 Addended by: DORLENE CHIQUITA RAMAN on: 05/02/2024 10:39 AM   Modules accepted: Orders

## 2024-05-02 NOTE — Patient Instructions (Signed)
 Acute persistent headache. At one-point sunday worse ha of life and still present. So with this given hx and her age proceed with caution - No chronic headache history or neurological deficits. Tylenol  ineffective. - Ct head wo contrast stat(prior auth pending) - Administer Toradol  30 mg injection. - Advise to discontinue black seed oil.(with worse ha of life on sunday described do not want to assume side effect of seed oil particularly since ha still persists and stopped seed oil already)  Hypertension Blood pressure at 130/88 mmHg, previously elevated during headache. Managed with hydrochlorothiazide . - Continue hydrochlorothiazide  25 mg daily. - Recheck blood pressure.  Hyperglycemia Elevated blood sugar in past labs. - Order future labs including sugar average test.  Hyperlipidemia High cholesterol. - Order future labs to assess lipid profile.  Fatigue Fatigue possibly related to recent travel and bereavement.  Follow up date to be determined after lab and imaging review.

## 2024-05-02 NOTE — Progress Notes (Signed)
 Subjective:    Patient ID: Patty [name], female    DOB: Apr 24, 1956, 68 y.o.   MRN: [id number]  HPI  Patty [name] is a 68 year old female with hypertension who presents with a persistent headache.  She has been experiencing a severe headache that began on Sunday afternoon around 4 PM and persisted until Monday evening at 6 PM. The headache was described as one of the worst she has ever experienced, with a pain level reaching 10 out of 10 at its peak. Although the intensity has decreased to a level 4 out of 10, the headache remains present. She initially attempted to alleviate the pain with Tylenol , taking a total of 10 tablets over two days, but found it ineffective.  She attributes the headache to an overdose of black seed oil, which she began taking on October 16th after returning from [place]. She consumed approximately two tablespoons daily, exceeding the recommended dosage of one teaspoon. Her husband also experienced headaches after taking the oil, which led him to suspect it as the cause. She stopped taking the oil on Monday morning.  In addition to the headache, she experiences chronic dry mouth and fatigue, which she attributes to recent travel and the passing of her brother.    No nausea, vomiting, skin rash, itching, vision changes, slurred speech, dizziness, or weakness in her extremities. Her blood pressure was elevated to 150/unknown during the headache episode, but she has a history of hypertension managed with hydrochlorothiazide  25 mg daily. She mentions occasional elevated blood pressure readings in the past, but nothing major. Her current blood pressure is 130/88.   Review of Systems  Constitutional:  Positive for fatigue. Negative for chills and fever.  HENT:  Negative for congestion.   Respiratory:  Negative for chest tightness, shortness of breath and wheezing.   Cardiovascular:  Negative for chest pain and palpitations.  Gastrointestinal:  Negative for abdominal  pain, diarrhea and vomiting.  Genitourinary:  Negative for dysuria and frequency.  Musculoskeletal:  Negative for back pain and myalgias.  Skin:  Negative for rash.  Neurological:  Positive for headaches. Negative for dizziness, seizures and weakness.  Psychiatric/Behavioral:  Negative for behavioral problems, decreased concentration and dysphoric mood. The patient is not nervous/anxious.     Past Medical History:  Diagnosis Date   Anxiety    Barrett's esophagus    Cardiomegaly    Dyspareunia    Fatty pancreas    Gastric ulcer    GERD (gastroesophageal reflux disease)    Hx of colonic polyps    Hyperlipidemia    Hypertension    Osteopenia    Osteoporosis    Shingles    Sleep apnea    Thyroid  nodule    Uterine leiomyoma      Social History   Socioeconomic History   Marital status: Married    Spouse name: Not on file   Number of children: Not on file   Years of education: Not on file   Highest education level: Master's degree (e.g., MA, MS, MEng, MEd, MSW, MBA)  Occupational History   Not on file  Tobacco Use   Smoking status: Never   Smokeless tobacco: Never  Vaping Use   Vaping status: Never Used  Substance and Sexual Activity   Alcohol use: No    Alcohol/week: 0.0 standard drinks of alcohol   Drug use: No   Sexual activity: Yes    Partners: Male    Birth control/protection: Surgical    Comment:  BTL  Other Topics Concern   Not on file  Social History Narrative   Not on file   Social Drivers of Health   Financial Resource Strain: Low Risk  (05/01/2024)   Overall Financial Resource Strain (CARDIA)    Difficulty of Paying Living Expenses: Not hard at all  Food Insecurity: No Food Insecurity (05/01/2024)   Hunger Vital Sign    Worried About Running Out of Food in the Last Year: Never true    Ran Out of Food in the Last Year: Never true  Transportation Needs: No Transportation Needs (05/01/2024)   PRAPARE - Administrator, Civil Service  (Medical): No    Lack of Transportation (Non-Medical): No  Physical Activity: Insufficiently Active (05/01/2024)   Exercise Vital Sign    Days of Exercise per Week: 3 days    Minutes of Exercise per Session: 20 min  Stress: No Stress Concern Present (05/01/2024)   [employer] of Occupational Health - Occupational Stress Questionnaire    Feeling of Stress: Only a little  Social Connections: Socially Integrated (05/01/2024)   Social Connection and Isolation Panel    Frequency of Communication with Friends and Family: More than three times a week    Frequency of Social Gatherings with Friends and Family: More than three times a week    Attends Religious Services: More than 4 times per year    Active Member of Clubs or Organizations: Yes    Attends Banker Meetings: More than 4 times per year    Marital Status: Married  Catering Manager Violence: Not At Risk (03/10/2024)   Humiliation, Afraid, Rape, and Kick questionnaire    Fear of Current or Ex-Partner: No    Emotionally Abused: No    Physically Abused: No    Sexually Abused: No    Past Surgical History:  Procedure Laterality Date   BIOPSY  07/04/2020   Procedure: BIOPSY;  Surgeon: Mansouraty, Aloha Raddle., MD;  Location: [facility] ENDOSCOPY;  Service: Gastroenterology;;   BIOPSY THYROID      COLONOSCOPY W/ POLYPECTOMY     ESOPHAGOGASTRODUODENOSCOPY (EGD) WITH PROPOFOL  N/A 07/04/2020   Procedure: ESOPHAGOGASTRODUODENOSCOPY (EGD) WITH PROPOFOL ;  Surgeon: Wilhelmenia Aloha Raddle., MD;  Location: [facility] ENDOSCOPY;  Service: Gastroenterology;  Laterality: N/A;   TUBAL LIGATION     UPPER ESOPHAGEAL ENDOSCOPIC ULTRASOUND (EUS) N/A 07/04/2020   Procedure: UPPER ESOPHAGEAL ENDOSCOPIC ULTRASOUND (EUS);  Surgeon: Wilhelmenia Aloha Raddle., MD;  Location: [facility] ENDOSCOPY;  Service: Gastroenterology;  Laterality: N/A;    Family History  Problem Relation Age of Onset   Hypertension Mother    Heart disease Mother    Heart disease Father     Hypertension Brother    Colon cancer Neg Hx    Esophageal cancer Neg Hx    Pancreatic cancer Neg Hx    Stomach cancer Neg Hx    Liver disease Neg Hx    Inflammatory bowel disease Neg Hx    Rectal cancer Neg Hx     No Known Allergies  Current Outpatient Medications on File Prior to Visit  Medication Sig Dispense Refill   alendronate  (FOSAMAX ) 10 MG tablet Take 1 tablet (10 mg total) by mouth daily before breakfast. Take with a full glass of water on an empty stomach. 30 tablet 11   ALPRAZolam  (XANAX ) 0.5 MG tablet Take 0.5-1 tablets (0.25-0.5 mg total) by mouth every evening. 0.5- 1 tab po q hs prn anxiety or insomnia 30 tablet 2   Evolocumab  (REPATHA  SURECLICK) 140 MG/ML SOAJ  INJECT 140MG  INTO THE SKIN EVERY 14 DAYS 6 mL 3   famotidine  (PEPCID ) 20 MG tablet Take 1 tablet (20 mg total) by mouth 2 (two) times daily. 180 tablet 0   fenofibrate  (TRICOR ) 145 MG tablet TAKE 1 TABLET BY MOUTH DAILY 90 tablet 0   fluticasone  (FLONASE ) 50 MCG/ACT nasal spray Place 2 sprays into both nostrils daily. 16 g 6   hydrochlorothiazide  (HYDRODIURIL ) 25 MG tablet TAKE 1 TABLET BY MOUTH DAILY 90 tablet 0   meloxicam  (MOBIC ) 7.5 MG tablet Take 1 tablet (7.5 mg total) by mouth daily. 30 tablet 0   nitroGLYCERIN  (NITROSTAT ) 0.4 MG SL tablet Place 1 tablet (0.4 mg total) under the tongue every 5 (five) minutes as needed. 25 tablet 3   NP THYROID  30 MG tablet Take 30 mg by mouth every morning.     Current Facility-Administered Medications on File Prior to Visit  Medication Dose Route Frequency Provider Last Rate Last Admin   0.9 %  sodium chloride  infusion  500 mL Intravenous Once Mansouraty, Gabriel Jr., MD       nitroGLYCERIN  (NITROSTAT ) SL tablet 0.4 mg  0.4 mg Sublingual Q5 min PRN Kate Lonni CROME, MD   0.4 mg at 09/27/20 1550    BP 137/87   Pulse 94   Temp 98 F (36.7 C)   Resp 18   Ht 5' 1 (1.549 m)   Wt 142 lb (64.4 kg)   LMP 07/06/2008   SpO2 97%   BMI 26.83 kg/m         Objective:   Physical Exam  General Mental Status- Alert. General Appearance- Not in acute distress.   Skin General: Color- Normal Color. Moisture- Normal Moisture.  Neck Carotid Arteries- Normal color. Moisture- Normal Moisture. No carotid bruits. No JVD.  Chest and Lung Exam Auscultation: Breath Sounds:-Normal.  Cardiovascular Auscultation:Rythm- Regular. Murmurs & Other Heart Sounds:Auscultation of the heart reveals- No Murmurs.  Abdomen Inspection:-Inspeection Normal. Palpation/Percussion:Note:No mass. Palpation and Percussion of the abdomen reveal- Non Tender, Non Distended + BS, no rebound or guarding.    Neurologic Cranial Nerve exam:- CN III-XII intact(No nystagmus), symmetric smile. Drift Test:- No drift. Romberg Exam:- Negative.  Finger to Nose:- Normal/Intact Strength:- 5/5 equal and symmetric strength both upper and lower extremities.        Assessment & Plan:  [phone] Acute persistent headache. At one-point sunday worse ha of life and still present. So with this given hx and her age proceed with caution - No chronic headache history or neurological deficits. Tylenol  ineffective. - Ct head wo contrast stat(prior auth pending) - Administer Toradol  30 mg injection. - Advise to discontinue black seed oil.(with worse ha of life on sunday described do not want to assume side effect of seed oil particularly since ha still persists and stopped seed oil already)  Hypertension Blood pressure at 130/88 mmHg, previously elevated during headache. Managed with hydrochlorothiazide . - Continue hydrochlorothiazide  25 mg daily. - Recheck blood pressure.  Hyperglycemia Elevated blood sugar in past labs. - Order future labs including sugar average test.  Hyperlipidemia High cholesterol. - Order future labs to assess lipid profile.  Fatigue Fatigue possibly related to recent travel and bereavement.  Follow up date to be determined after lab and imaging  review.  Cyann Venti, PA-C

## 2024-05-03 ENCOUNTER — Other Ambulatory Visit

## 2024-05-03 ENCOUNTER — Ambulatory Visit (HOSPITAL_BASED_OUTPATIENT_CLINIC_OR_DEPARTMENT_OTHER)
Admission: RE | Admit: 2024-05-03 | Discharge: 2024-05-03 | Disposition: A | Discharge status: Home / Self Care | Source: Ambulatory Visit | Attending: Medical | Admitting: Medical

## 2024-05-03 DIAGNOSIS — R519 Headache, unspecified: Secondary | ICD-10-CM | POA: Insufficient documentation

## 2024-05-03 DIAGNOSIS — R5383 Other fatigue: Secondary | ICD-10-CM | POA: Diagnosis not present

## 2024-05-03 DIAGNOSIS — R739 Hyperglycemia, unspecified: Secondary | ICD-10-CM

## 2024-05-03 DIAGNOSIS — I10 Essential (primary) hypertension: Secondary | ICD-10-CM

## 2024-05-03 LAB — CBC WITH DIFFERENTIAL/PLATELET
Basophils Absolute: 0.1 K/uL (ref 0.0–0.1)
Basophils Relative: 0.8 % (ref 0.0–3.0)
Eosinophils Absolute: 0.2 K/uL (ref 0.0–0.7)
Eosinophils Relative: 2.5 % (ref 0.0–5.0)
HCT: 41.4 % (ref 36.0–46.0)
Hemoglobin: 13.9 g/dL (ref 12.0–15.0)
Lymphocytes Relative: 29 % (ref 12.0–46.0)
Lymphs Abs: 1.9 K/uL (ref 0.7–4.0)
MCHC: 33.5 g/dL (ref 30.0–36.0)
MCV: 88.8 fl (ref 78.0–100.0)
Monocytes Absolute: 0.6 K/uL (ref 0.1–1.0)
Monocytes Relative: 9.6 % (ref 3.0–12.0)
Neutro Abs: 3.9 K/uL (ref 1.4–7.7)
Neutrophils Relative %: 58.1 % (ref 43.0–77.0)
Platelets: 371 K/uL (ref 150.0–400.0)
RBC: 4.66 Mil/uL (ref 3.87–5.11)
RDW: 13.5 % (ref 11.5–15.5)
WBC: 6.7 K/uL (ref 4.0–10.5)

## 2024-05-03 LAB — TSH: TSH: 1.5 u[IU]/mL (ref 0.35–5.50)

## 2024-05-03 LAB — T4, FREE: Free T4: 0.88 ng/dL (ref 0.60–1.60)

## 2024-05-03 LAB — HEMOGLOBIN A1C: Hgb A1c MFr Bld: 5.9 % (ref 4.6–6.5)

## 2024-05-04 ENCOUNTER — Emergency Department (HOSPITAL_BASED_OUTPATIENT_CLINIC_OR_DEPARTMENT_OTHER)
Admission: EM | Admit: 2024-05-04 | Discharge: 2024-05-04 | Disposition: A | Discharge status: Home / Self Care | Attending: Emergency Medicine | Admitting: Emergency Medicine

## 2024-05-04 ENCOUNTER — Emergency Department (HOSPITAL_BASED_OUTPATIENT_CLINIC_OR_DEPARTMENT_OTHER)

## 2024-05-04 ENCOUNTER — Ambulatory Visit: Admitting: Physician Assistant

## 2024-05-04 ENCOUNTER — Emergency Department (HOSPITAL_COMMUNITY)

## 2024-05-04 ENCOUNTER — Ambulatory Visit: Payer: Self-pay | Admitting: Medical

## 2024-05-04 ENCOUNTER — Encounter (HOSPITAL_BASED_OUTPATIENT_CLINIC_OR_DEPARTMENT_OTHER): Payer: Self-pay

## 2024-05-04 ENCOUNTER — Other Ambulatory Visit: Payer: Self-pay

## 2024-05-04 DIAGNOSIS — Z79899 Other long term (current) drug therapy: Secondary | ICD-10-CM | POA: Insufficient documentation

## 2024-05-04 DIAGNOSIS — M47812 Spondylosis without myelopathy or radiculopathy, cervical region: Secondary | ICD-10-CM | POA: Diagnosis not present

## 2024-05-04 DIAGNOSIS — E041 Nontoxic single thyroid nodule: Secondary | ICD-10-CM | POA: Diagnosis not present

## 2024-05-04 DIAGNOSIS — M4802 Spinal stenosis, cervical region: Secondary | ICD-10-CM | POA: Diagnosis not present

## 2024-05-04 DIAGNOSIS — R29818 Other symptoms and signs involving the nervous system: Secondary | ICD-10-CM | POA: Diagnosis not present

## 2024-05-04 DIAGNOSIS — M4800 Spinal stenosis, site unspecified: Secondary | ICD-10-CM | POA: Insufficient documentation

## 2024-05-04 DIAGNOSIS — R072 Precordial pain: Secondary | ICD-10-CM | POA: Insufficient documentation

## 2024-05-04 DIAGNOSIS — R202 Paresthesia of skin: Secondary | ICD-10-CM | POA: Diagnosis not present

## 2024-05-04 DIAGNOSIS — Z0389 Encounter for observation for other suspected diseases and conditions ruled out: Secondary | ICD-10-CM | POA: Diagnosis not present

## 2024-05-04 DIAGNOSIS — R200 Anesthesia of skin: Secondary | ICD-10-CM | POA: Insufficient documentation

## 2024-05-04 DIAGNOSIS — R0789 Other chest pain: Secondary | ICD-10-CM | POA: Diagnosis not present

## 2024-05-04 DIAGNOSIS — I10 Essential (primary) hypertension: Secondary | ICD-10-CM | POA: Diagnosis not present

## 2024-05-04 DIAGNOSIS — R079 Chest pain, unspecified: Secondary | ICD-10-CM | POA: Diagnosis not present

## 2024-05-04 DIAGNOSIS — M5021 Other cervical disc displacement,  high cervical region: Secondary | ICD-10-CM | POA: Diagnosis not present

## 2024-05-04 LAB — COMPREHENSIVE METABOLIC PANEL WITH GFR
ALT: 32 U/L (ref 0–44)
AST: 26 U/L (ref 15–41)
Albumin: 4.9 g/dL (ref 3.5–5.0)
Alkaline Phosphatase: 112 U/L (ref 38–126)
Anion gap: 14 (ref 5–15)
BUN: 12 mg/dL (ref 8–23)
CO2: 21 mmol/L — ABNORMAL LOW (ref 22–32)
Calcium: 10 mg/dL (ref 8.9–10.3)
Chloride: 105 mmol/L (ref 98–111)
Creatinine, Ser: 0.67 mg/dL (ref 0.44–1.00)
GFR, Estimated: >60 mL/min (ref >60)
Glucose, Bld: 126 mg/dL — ABNORMAL HIGH (ref 70–99)
Potassium: 3.5 mmol/L (ref 3.5–5.1)
Sodium: 140 mmol/L (ref 135–145)
Total Bilirubin: 0.5 mg/dL (ref 0.0–1.2)
Total Protein: 7.6 g/dL (ref 6.5–8.1)

## 2024-05-04 LAB — COMPLETE METABOLIC PANEL WITHOUT GFR
AG Ratio: 2 (calc) (ref 1.0–2.5)
ALT: 27 U/L (ref 6–29)
AST: 16 U/L (ref 10–35)
Albumin: 4.9 g/dL (ref 3.6–5.1)
Alkaline phosphatase (APISO): 105 U/L (ref 37–153)
BUN: 19 mg/dL (ref 7–25)
CO2: 26 mmol/L (ref 20–32)
Calcium: 10.9 mg/dL — ABNORMAL HIGH (ref 8.6–10.4)
Chloride: 102 mmol/L (ref 98–110)
Creat: 0.79 mg/dL (ref 0.50–1.05)
Globulin: 2.4 g/dL (ref 1.9–3.7)
Glucose, Bld: 99 mg/dL (ref 65–99)
Potassium: 4.3 mmol/L (ref 3.5–5.3)
Sodium: 137 mmol/L (ref 135–146)
Total Bilirubin: 0.5 mg/dL (ref 0.2–1.2)
Total Protein: 7.3 g/dL (ref 6.1–8.1)

## 2024-05-04 LAB — CBC WITH DIFFERENTIAL/PLATELET
Abs Immature Granulocytes: 0.02 K/uL (ref 0.00–0.07)
Basophils Absolute: 0.1 K/uL (ref 0.0–0.1)
Basophils Relative: 1 %
Eosinophils Absolute: 0.1 K/uL (ref 0.0–0.5)
Eosinophils Relative: 1 %
HCT: 41.6 % (ref 36.0–46.0)
Hemoglobin: 14.1 g/dL (ref 12.0–15.0)
Immature Granulocytes: 0 %
Lymphocytes Relative: 19 %
Lymphs Abs: 1.5 K/uL (ref 0.7–4.0)
MCH: 29.7 pg (ref 26.0–34.0)
MCHC: 33.9 g/dL (ref 30.0–36.0)
MCV: 87.6 fL (ref 80.0–100.0)
Monocytes Absolute: 0.7 K/uL (ref 0.1–1.0)
Monocytes Relative: 8 %
Neutro Abs: 5.6 K/uL (ref 1.7–7.7)
Neutrophils Relative %: 71 %
Platelets: 362 K/uL (ref 150–400)
RBC: 4.75 MIL/uL (ref 3.87–5.11)
RDW: 12.8 % (ref 11.5–15.5)
WBC: 7.9 K/uL (ref 4.0–10.5)
nRBC: 0 % (ref 0.0–0.2)

## 2024-05-04 LAB — MAGNESIUM: Magnesium: 2.3 mg/dL (ref 1.7–2.4)

## 2024-05-04 LAB — TROPONIN T, HIGH SENSITIVITY
Troponin T High Sensitivity: <15 ng/L (ref 0–19)
Troponin T High Sensitivity: <15 ng/L (ref 0–19)

## 2024-05-04 LAB — LIPASE, BLOOD: Lipase: 43 U/L (ref 11–51)

## 2024-05-04 MED ORDER — METHOCARBAMOL 500 MG PO TABS: 500.0000 mg | ORAL_TABLET | Freq: Two times a day (BID) | ORAL | 0 refills | Status: AC

## 2024-05-04 MED ORDER — IOHEXOL 350 MG/ML SOLN
50.0000 mL | Freq: Once | INTRAVENOUS | Status: AC | PRN
  Administered 2024-05-04: 50 mL via INTRAVENOUS

## 2024-05-04 MED ORDER — KETOROLAC TROMETHAMINE 30 MG/ML IJ SOLN
15.0000 mg | Freq: Once | INTRAMUSCULAR | Status: AC
  Administered 2024-05-04: 15 mg via INTRAVENOUS
  Filled 2024-05-04: qty 1

## 2024-05-04 NOTE — ED Notes (Signed)
 Pt assisted up to [facility] ambulatory

## 2024-05-04 NOTE — ED Notes (Signed)
 To CT

## 2024-05-04 NOTE — Discharge Instructions (Addendum)
 Please follow-up with neurosurgery regarding your MRI.  If you have any worsening symptoms or have any further concerns please return to the emergency department

## 2024-05-04 NOTE — ED Notes (Signed)
 Pt provided w/ d/c and f.u instructions. Pt/family verbalized understanding. Pt refused d/c vitals, states she wants to go. LDA removed. Pt ambulatory out of ED w/ all paperwork and belongings

## 2024-05-04 NOTE — ED Triage Notes (Signed)
 Numbness to left side since last night 930 pm. Woke up during night with left sided chest pressure. HA and dizziness since Sunday seen by PCP and had CT which pt reports was normal. EDP in triage to assess. NO LVO at this time

## 2024-05-04 NOTE — ED Provider Notes (Signed)
 [place] EMERGENCY DEPARTMENT AT [facility] Provider Note   CSN: [id number] Arrival date & time: 05/04/24  1038     Patient presents with: Numbness and Chest Pain   Patty [name] is a 68 y.o. female.   68 year old female transferred from outside ED for MRI.  Patient reports a headache that began several days ago.  Last night around 9:30 PM she began experiencing left arm and left leg numbness that has continued.  She also reports some mild chest discomfort that began early this morning.  Woke up at the outside facility including CT angio head and neck, blood work, and troponin were unremarkable.  She continues to have left-sided numbness without weakness.   Chest Pain      Prior to Admission medications   Medication Sig Start Date End Date Taking? Authorizing Provider  methocarbamol (ROBAXIN) 500 MG tablet Take 1 tablet (500 mg total) by mouth 2 (two) times daily. 05/04/24  Yes Brenn Gatton, DO  alendronate  (FOSAMAX ) 10 MG tablet Take 1 tablet (10 mg total) by mouth daily before breakfast. Take with a full glass of water on an empty stomach. 11/21/22   Saguier, [place], PA-C  ALPRAZolam  (XANAX ) 0.5 MG tablet Take 0.5-1 tablets (0.25-0.5 mg total) by mouth every evening. 0.5- 1 tab po q hs prn anxiety or insomnia 11/03/23   Saguier, [place], PA-C  Evolocumab  (REPATHA  SURECLICK) 140 MG/ML SOAJ INJECT 140MG  INTO THE SKIN EVERY 14 DAYS 10/19/23   Kate Lonni CROME, MD  famotidine  (PEPCID ) 20 MG tablet Take 1 tablet (20 mg total) by mouth 2 (two) times daily. 09/20/23   Saguier, [place], PA-C  fenofibrate  (TRICOR ) 145 MG tablet TAKE 1 TABLET BY MOUTH DAILY 04/24/24   Saguier, [place], PA-C  fluticasone  (FLONASE ) 50 MCG/ACT nasal spray Place 2 sprays into both nostrils daily. 07/15/22   Lavell Bari LABOR, FNP  hydrochlorothiazide  (HYDRODIURIL ) 25 MG tablet TAKE 1 TABLET BY MOUTH DAILY 04/24/24   Saguier, [place], PA-C  meloxicam  (MOBIC ) 7.5 MG tablet Take 1 tablet (7.5 mg total) by  mouth daily. 11/03/23   Saguier, [place], PA-C  nitroGLYCERIN  (NITROSTAT ) 0.4 MG SL tablet Place 1 tablet (0.4 mg total) under the tongue every 5 (five) minutes as needed. 02/05/23 03/14/24  Kate Lonni CROME, MD  NP THYROID  30 MG tablet Take 30 mg by mouth every morning. 02/03/24   [provider]    Allergies: Patient has no known allergies.    Review of Systems  Cardiovascular:  Positive for chest pain.  All other systems reviewed and are negative.   Updated Vital Signs BP 123/62   Pulse 75   Temp 98.1 F (36.7 C)   Resp 20   Wt 64.4 kg   LMP 07/06/2008   SpO2 98%   BMI 26.83 kg/m   Physical Exam Vitals and nursing note reviewed.  Constitutional:      General: She is not in acute distress. HENT:     Head: Normocephalic and atraumatic.  Eyes:     Extraocular Movements: Extraocular movements intact.     Pupils: Pupils are equal, round, and reactive to light.  Cardiovascular:     Rate and Rhythm: Normal rate.     Heart sounds: Normal heart sounds.  Pulmonary:     Effort: Pulmonary effort is normal.     Breath sounds: No wheezing.  Abdominal:     Palpations: Abdomen is soft.  Musculoskeletal:        General: Normal range of motion.  Cervical back: Neck supple.  Skin:    General: Skin is warm.     Capillary Refill: Capillary refill takes less than 2 seconds.  Neurological:     General: No focal deficit present.     Mental Status: She is alert and oriented to person, place, and time.     Motor: No weakness.     Comments: Left arm and left leg decree sensation     (all labs ordered are listed, but only abnormal results are displayed) Labs Reviewed  COMPREHENSIVE METABOLIC PANEL WITH GFR - Abnormal; Notable for the following components:      Result Value   CO2 21 (*)    Glucose, Bld 126 (*)    All other components within normal limits  LIPASE, BLOOD  CBC WITH DIFFERENTIAL/PLATELET  MAGNESIUM   TROPONIN T, HIGH SENSITIVITY  TROPONIN T, HIGH  SENSITIVITY    EKG: EKG Interpretation Date/Time:  Thursday May 04 2024 10:44:59 EDT Ventricular Rate:  99 PR Interval:  178 QRS Duration:  94 QT Interval:  366 QTC Calculation: 470 R Axis:   -13  Text Interpretation: Sinus rhythm Probable left atrial enlargement Low voltage, precordial leads Baseline wander in lead(s) V4 Similar to prior Confirmed by Darra Chew [phone]) on 05/04/2024 10:55:18 AM  Radiology: MR Cervical Spine Wo Contrast Result Date: 05/04/2024 CLINICAL DATA:  Initial evaluation for acute left-sided numbness. EXAM: MRI CERVICAL SPINE WITHOUT CONTRAST TECHNIQUE: Multiplanar, multisequence MR imaging of the cervical spine was performed. No intravenous contrast was administered. COMPARISON:  None Available. FINDINGS: Alignment: Reversal of the normal cervical lordosis, apex at C4-5. Trace anterolisthesis of T1 on T2 and T2 on T3. Vertebrae: Vertebral body height maintained without acute or chronic fracture. Bone marrow signal intensity heterogeneous but overall within normal limits. No worrisome osseous lesions. Mild degenerative reactive endplate changes present about the C4-5 through C6-7 interspaces. No other abnormal marrow edema. Cord: Normal signal and morphology. Posterior Fossa, vertebral arteries, paraspinal tissues: 2.7 cm left thyroid  nodule (series 7, image 25). Disc levels: C2-C3: Small central disc protrusion minimally indents the ventral thecal sac. No spinal stenosis. Foramina remain patent. C3-C4: Mild disc bulge with uncovertebral spurring. Mild left-sided facet hypertrophy. Mild spinal stenosis with mild left C4 foraminal narrowing. Right neural foramina remains patent. C4-C5: Mild intervertebral disc space narrowing with diffuse disc osteophyte complex. Flattening and partial effacement of the ventral thecal sac with resultant mild spinal stenosis. Severe left with moderate right C5 foraminal narrowing. C5-C6: Circumferential disc osteophyte complex.  Flattening and partial effacement of the ventral thecal sac, asymmetric to the right. Mild spinal stenosis. Moderate left with mild right C6 foraminal narrowing. C6-C7: Diffuse disc bulge with bilateral uncovertebral spurring. Mild left-sided facet hypertrophy. No significant spinal stenosis. Moderate left with mild right C7 foraminal narrowing. C7-T1: Normal interspace. Mild left-sided facet hypertrophy. No spinal stenosis. Mild left C8 foraminal narrowing. Right neural foramen remains patent. IMPRESSION: 1. No acute abnormality within the cervical spine. 2. Multilevel cervical spondylosis with resultant mild diffuse spinal stenosis at C3-4 through C5-6. 3. Multifactorial degenerative changes with resultant multilevel foraminal narrowing as above. Notable findings include severe left with moderate right C5 foraminal stenosis, with moderate left C6 and C7 foraminal narrowing. 4. 2.7 cm left thyroid  nodule. Further evaluation with dedicated nonemergent thyroid  ultrasound recommended. Electronically Signed   By: Morene Hoard M.D.   On: 05/04/2024 19:13   MR BRAIN WO CONTRAST Result Date: 05/04/2024 CLINICAL DATA:  Initial evaluation for acute neuro deficit, stroke suspected. EXAM: MRI HEAD  WITHOUT CONTRAST TECHNIQUE: Multiplanar, multiecho pulse sequences of the brain and surrounding structures were obtained without intravenous contrast. COMPARISON:  Prior CT from earlier the same day. FINDINGS: Brain: Cerebral volume within normal limits for age. Minimal hazy FLAIR signal abnormality noted about the periventricular white matter, most likely related to chronic microvascular ischemic disease, less than is typically seen for age. No abnormal foci of restricted diffusion to suggest acute or subacute ischemia. Gray-white matter differentiation well maintained. No encephalomalacia to suggest chronic cortical infarction or other insult. No foci of susceptibility artifact indicative of acute or chronic  intracranial blood products. No mass lesion, midline shift or mass effect. Ventricles normal in size and morphology without hydrocephalus. No extra-axial fluid collection. Pituitary gland and suprasellar region within normal limits. Vascular: Major intracranial vascular flow voids are well maintained. Skull and upper cervical spine: Craniocervical junction within normal limits. Visualized upper cervical spine demonstrates no significant finding. Bone marrow signal intensity within normal limits. No scalp soft tissue abnormality. Sinuses/Orbits: Globes and orbital soft tissues are within normal limits. Mild scattered mucosal thickening present about the ethmoidal air cells. Paranasal sinuses are otherwise largely clear. No significant mastoid effusion. Other: None. IMPRESSION: Normal brain MRI for age. No acute intracranial abnormality identified. Electronically Signed   By: Morene Hoard M.D.   On: 05/04/2024 18:55   CT ANGIO HEAD NECK W WO CM Result Date: 05/04/2024 EXAM: CTA HEAD AND NECK WITH AND WITHOUT 05/04/2024 12:36:54 PM TECHNIQUE: CTA of the head and neck was performed with and without the administration of 50 mL of iohexol  (OMNIPAQUE ) 350 MG/ML injection. Multiplanar 2D and/or 3D reformatted images are provided for review. Automated exposure control, iterative reconstruction, and/or weight based adjustment of the mA/kV was utilized to reduce the radiation dose to as low as reasonably achievable. Stenosis of the internal carotid arteries measured using NASCET criteria. COMPARISON: Ultrasound of the thyroid  dated 04/25/2009. CLINICAL HISTORY: Neuro deficit, acute, stroke suspected. FINDINGS: CTA NECK: AORTIC ARCH AND ARCH VESSELS: No dissection or arterial injury. No significant stenosis of the brachiocephalic or subclavian arteries. CERVICAL CAROTID ARTERIES: No dissection, arterial injury, or hemodynamically significant stenosis by NASCET criteria. There is an ovoid solid nodule within the left  lobe of the thyroid  which measures approximately 2.6 x 2.4 x 3.1 cm, slightly increased from the previous study when it measured 2.1 x 2.6 x 3.0 cm. CERVICAL VERTEBRAL ARTERIES: No dissection, arterial injury, or significant stenosis. LUNGS AND MEDIASTINUM: Unremarkable. SOFT TISSUES: No acute abnormality. BONES: No acute abnormality. CTA HEAD: ANTERIOR CIRCULATION: No significant stenosis of the internal carotid arteries. No significant stenosis of the anterior cerebral arteries. No significant stenosis of the middle cerebral arteries. No aneurysm. POSTERIOR CIRCULATION: Fetal origin of the right posterior cerebral artery and fetal type origin of the left posterior cerebral artery. No significant stenosis of the basilar artery. No significant stenosis of the vertebral arteries. No aneurysm. OTHER: No dural venous sinus thrombosis on this non-dedicated study. IMPRESSION: 1. No large vessel occlusion, hemodynamically significant stenosis, or aneurysm in the head or neck. 2. Fetal origin of the right posterior cerebral artery and fetal type origin of the left posterior cerebral artery. 3. Incidental left thyroid  solid nodule measuring up to 3.1 cm, slightly increased from prior (previously up to 3.0 cm). Recommend non-emergent thyroid  ultrasound for further characterization per ACR guidelines. Electronically signed by: Evalene Coho MD 05/04/2024 12:55 PM EDT RP Workstation: [id number]   DG Chest Portable 1 View Result Date: 05/04/2024 EXAM: 1 VIEW(S) XRAY OF THE CHEST  05/04/2024 11:47:28 AM COMPARISON: 12/14/22 CLINICAL HISTORY: chest pain FINDINGS: LUNGS AND PLEURA: No focal pulmonary opacity. No pulmonary edema. No pleural effusion. No pneumothorax. HEART AND MEDIASTINUM: No acute abnormality of the cardiac and mediastinal silhouettes. BONES AND SOFT TISSUES: No acute osseous abnormality. IMPRESSION: 1. No acute cardiopulmonary process. Electronically signed by: Waddell Calk MD 05/04/2024 12:44 PM EDT RP  Workstation: [id number]   CT HEAD WO CONTRAST ( ) Result Date: 05/03/2024 EXAM: CT HEAD WITHOUT CONTRAST 05/03/2024 10:36:00 AM TECHNIQUE: CT of the head was performed without the administration of intravenous contrast. Automated exposure control, iterative reconstruction, and/or weight based adjustment of the mA/kV was utilized to reduce the radiation dose to as low as reasonably achievable. COMPARISON: None available. CLINICAL HISTORY: Headache, new onset (Age >= 51y); pt ha started on sunday was worse ha at that time. now approaching 48 hours and ha less but still present. 3days of ha. FINDINGS: BRAIN AND VENTRICLES: No acute hemorrhage. No evidence of acute infarct. No hydrocephalus. No extra-axial collection. No mass effect or midline shift. 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 ORBITS: No acute abnormality. SINUSES: No acute abnormality. SOFT TISSUES AND SKULL: No acute soft tissue abnormality. No skull fracture. IMPRESSION: 1. No acute intracranial abnormality. 2. 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 Electronically signed by: Evalene Coho MD 05/03/2024 10:53 AM EDT RP Workstation: [id number]     Procedures   Medications Ordered in the ED  iohexol  (OMNIPAQUE ) 350 MG/ML injection 50 mL (50 mLs Intravenous Contrast Given 05/04/24 1223)  ketorolac  (TORADOL ) 30 MG/ML injection 15 mg (15 mg Intravenous Given 05/04/24 1326)                                    Medical Decision Making 69 year old female presenting for MRI due to her left-sided numbness that began last night.  She is overall well-appearing and answering questions appropriately.  Cardiac evaluation at the outside hospital was negative.  She denies any current chest pain or palpitations.  We have ordered the MRI and I have added on an MRI of the cervical spine to evaluate for any cause of her symptoms. MRI of the brain showed no acute  abnormalities.  MRI of the C-spine shows multilevel cervical spondylosis with resultant mild diffuse spinal stenosis at C3-4 through C5-6.  She also has multiple levels that show degenerative changes and foraminal narrowing.  The most severe narrowing seen on the left side at C5.  She has moderate right C5 foraminal stenosis with moderate left C6 and C7 foraminal narrowing.  Suspect this is likely the cause of her numbness.  We will send a referral for her to follow-up with neurosurgery regarding her MRI.  No indication for emergent neurosurgical intervention.    Amount and/or Complexity of Data Reviewed Labs: ordered. Radiology: ordered.  Risk Prescription drug management.     Final diagnoses:  Precordial chest pain  Spinal stenosis, unspecified spinal region  Numbness    ED Discharge Orders          Ordered    Ambulatory referral to Cardiology       Comments: If you have not heard from the Cardiology office within the next 72 hours please call [phone].   05/04/24 1405    Ambulatory referral to Neurosurgery       Comments: Please Select To Department: [facility] for Nerve or Spine  Please select To Department: [facility] AT  [facility] for Cranial or Neurovascular   05/04/24 1934    methocarbamol (ROBAXIN) 500 MG tablet  2 times daily        05/04/24 1934               [place] No, DO 05/04/24 1934

## 2024-05-04 NOTE — Addendum Note (Signed)
 Addended by: DORINA DALLAS HERO on: 05/04/2024 08:51 AM   Modules accepted: Orders

## 2024-05-04 NOTE — ED Notes (Signed)
 IV secured for  POV transport to [facility] for MRI

## 2024-05-04 NOTE — ED Provider Notes (Signed)
 Emergency Department Provider Note   I have reviewed the triage vital signs and the nursing notes.   HISTORY  Chief Complaint Numbness and Chest Pain   HPI Patty [name] is a 68 y.o. female with past history reviewed below presents to the emergency department with acute onset left arm and leg numbness.  Symptoms began at 9:30 PM yesterday.  She states the day prior she was feeling lightheaded and fatigued.  She did not immediately present to the ED after developing numbness.  No weakness.  She does have a mild headache.  No vision change although notes some baseline central vision field loss in the left eye at baseline.  No speech disturbance.  No weakness or trouble walking.  This morning, she also developed some heavy chest discomfort on the left and in conjunction with the numbness presented to the ED by private vehicle.  She denies any sharp, ripping/tearing pain.  No shortness of breath.  No fevers or chills.   Past Medical History:  Diagnosis Date   Anxiety    Barrett's esophagus    Cardiomegaly    Dyspareunia    Fatty pancreas    Gastric ulcer    GERD (gastroesophageal reflux disease)    Hx of colonic polyps    Hyperlipidemia    Hypertension    Osteopenia    Osteoporosis    Shingles    Sleep apnea    Thyroid  nodule    Uterine leiomyoma     Review of Systems  Constitutional: No fever/chills Eyes: No visual changes. Cardiovascular: Positive chest pain. Respiratory: Denies shortness of breath. Gastrointestinal: No abdominal pain.  No nausea, no vomiting.   Genitourinary: Negative for dysuria. Musculoskeletal: Negative for back pain. Skin: Negative for rash. Neurological: Negative for headaches. Positive left side numbness.   ____________________________________________   PHYSICAL EXAM:  VITAL SIGNS: ED Triage Vitals  Encounter Vitals Group     BP 05/04/24 1053 (!) 166/100     Pulse Rate 05/04/24 1053 (!) 110     Resp 05/04/24 1053 20     Temp  05/04/24 1053 98.4 F (36.9 C)     Temp Source 05/04/24 1053 Oral     SpO2 05/04/24 1053 96 %     Weight 05/04/24 1051 142 lb (64.4 kg)   Constitutional: Alert and oriented. Well appearing and in no acute distress. Eyes: Conjunctivae are slightly injected on the left with normal extraocular movements.  No visual field deficits other than the baseline, central vision loss in the left eye.  Peripheral vision intact.  Head: Atraumatic. Nose: No congestion/rhinnorhea. Mouth/Throat: Mucous membranes are moist.  Neck: No stridor. Cardiovascular: Normal rate, regular rhythm. Good peripheral circulation. Grossly normal heart sounds.   Respiratory: Normal respiratory effort.  No retractions. Lungs CTAB. Gastrointestinal: Soft and nontender. No distention.  Musculoskeletal: No lower extremity tenderness nor edema. No gross deformities of extremities. Neurologic:  Normal speech and language.  Decree sensation to light touch to the left arm and leg.  No facial asymmetry.  5/5 strength in the bilateral upper and lower extremities.  No pronator drift. Skin:  Skin is warm, dry and intact. No rash noted.   ____________________________________________   LABS (all labs ordered are listed, but only abnormal results are displayed)  Labs Reviewed  COMPREHENSIVE METABOLIC PANEL WITH GFR - Abnormal; Notable for the following components:      Result Value   CO2 21 (*)    Glucose, Bld 126 (*)    All other components  within normal limits  LIPASE, BLOOD  CBC WITH DIFFERENTIAL/PLATELET  MAGNESIUM   TROPONIN T, HIGH SENSITIVITY  TROPONIN T, HIGH SENSITIVITY   ____________________________________________  EKG   EKG Interpretation Date/Time:  Thursday May 04 2024 10:44:59 EDT Ventricular Rate:  99 PR Interval:  178 QRS Duration:  94 QT Interval:  366 QTC Calculation: 470 R Axis:   -13  Text Interpretation: Sinus rhythm Probable left atrial enlargement Low voltage, precordial leads Baseline  wander in lead(s) V4 Similar to prior Confirmed by Darra Chew [phone]) on 05/04/2024 10:55:18 AM        ____________________________________________  RADIOLOGY  CT ANGIO HEAD NECK W WO CM Result Date: 05/04/2024 EXAM: CTA HEAD AND NECK WITH AND WITHOUT 05/04/2024 12:36:54 PM TECHNIQUE: CTA of the head and neck was performed with and without the administration of 50 mL of iohexol  (OMNIPAQUE ) 350 MG/ML injection. Multiplanar 2D and/or 3D reformatted images are provided for review. Automated exposure control, iterative reconstruction, and/or weight based adjustment of the mA/kV was utilized to reduce the radiation dose to as low as reasonably achievable. Stenosis of the internal carotid arteries measured using NASCET criteria. COMPARISON: Ultrasound of the thyroid  dated 04/25/2009. CLINICAL HISTORY: Neuro deficit, acute, stroke suspected. FINDINGS: CTA NECK: AORTIC ARCH AND ARCH VESSELS: No dissection or arterial injury. No significant stenosis of the brachiocephalic or subclavian arteries. CERVICAL CAROTID ARTERIES: No dissection, arterial injury, or hemodynamically significant stenosis by NASCET criteria. There is an ovoid solid nodule within the left lobe of the thyroid  which measures approximately 2.6 x 2.4 x 3.1 cm, slightly increased from the previous study when it measured 2.1 x 2.6 x 3.0 cm. CERVICAL VERTEBRAL ARTERIES: No dissection, arterial injury, or significant stenosis. LUNGS AND MEDIASTINUM: Unremarkable. SOFT TISSUES: No acute abnormality. BONES: No acute abnormality. CTA HEAD: ANTERIOR CIRCULATION: No significant stenosis of the internal carotid arteries. No significant stenosis of the anterior cerebral arteries. No significant stenosis of the middle cerebral arteries. No aneurysm. POSTERIOR CIRCULATION: Fetal origin of the right posterior cerebral artery and fetal type origin of the left posterior cerebral artery. No significant stenosis of the basilar artery. No significant stenosis of  the vertebral arteries. No aneurysm. OTHER: No dural venous sinus thrombosis on this non-dedicated study. IMPRESSION: 1. No large vessel occlusion, hemodynamically significant stenosis, or aneurysm in the head or neck. 2. Fetal origin of the right posterior cerebral artery and fetal type origin of the left posterior cerebral artery. 3. Incidental left thyroid  solid nodule measuring up to 3.1 cm, slightly increased from prior (previously up to 3.0 cm). Recommend non-emergent thyroid  ultrasound for further characterization per ACR guidelines. Electronically signed by: Evalene Coho MD 05/04/2024 12:55 PM EDT RP Workstation: [id number]   DG Chest Portable 1 View Result Date: 05/04/2024 EXAM: 1 VIEW(S) XRAY OF THE CHEST 05/04/2024 11:47:28 AM COMPARISON: 12/14/22 CLINICAL HISTORY: chest pain FINDINGS: LUNGS AND PLEURA: No focal pulmonary opacity. No pulmonary edema. No pleural effusion. No pneumothorax. HEART AND MEDIASTINUM: No acute abnormality of the cardiac and mediastinal silhouettes. BONES AND SOFT TISSUES: No acute osseous abnormality. IMPRESSION: 1. No acute cardiopulmonary process. Electronically signed by: Waddell Calk MD 05/04/2024 12:44 PM EDT RP Workstation: [id number]    ____________________________________________   PROCEDURES  Procedure(s) performed:   Procedures  None  ____________________________________________   INITIAL IMPRESSION / ASSESSMENT AND PLAN / ED COURSE  Pertinent labs & imaging results that were available during my care of the patient were reviewed by me and considered in my medical decision making (see chart for details).  This patient is Presenting for Evaluation of numbness/CP, which does require a range of treatment options, and is a complaint that involves a high risk of morbidity and mortality.  The Differential Diagnoses includes but is not exclusive to alcohol, illicit or prescription medications, intracranial pathology such as stroke, intracerebral  hemorrhage, fever or infectious causes including sepsis, hypoxemia, uremia, trauma, endocrine related disorders such as diabetes, hypoglycemia, thyroid -related diseases, etc.   Critical Interventions-    Medications  iohexol  (OMNIPAQUE ) 350 MG/ML injection 50 mL (50 mLs Intravenous Contrast Given 05/04/24 1223)  ketorolac  (TORADOL ) 30 MG/ML injection 15 mg (15 mg Intravenous Given 05/04/24 1326)    Reassessment after intervention: symptoms improving. CP resolved.   I did obtain Additional Historical Information from friend at bedside.   Clinical Laboratory Tests Ordered, included CMP shows normal LFTs and bilirubin.  Magnesium  normal.  CBC without leukocytosis.  Troponin normal.  Radiologic Tests Ordered, included CTA head/neck and CXR. I independently interpreted the images and agree with radiology interpretation.   Cardiac Monitor Tracing which shows NSR.    Social Determinants of Health Risk patient is a non-smoker.   Consult complete with  Medical Decision Making: Summary:  The patient presents to the emergency department with numbness to the left arm and leg since yesterday.  No severe neck or back pain.  Do have some concern for possible CVA although patient is outside of the TNK time window.  She has no weakness or vision/speech changes to strongly suspect LVO.  I evaluated the patient in triage and did not activate a code stroke in the setting.  Will move forward with CT angio head and neck along with chest pain evaluation.  Reevaluation with update and discussion with patient.  CTA head and neck unremarkable.  Troponin negative x 2.  EKG reassuring.  Symptoms are largely improved.  She has some residual numbness in the arm and leg on the left although it is improved.  Discussed need for MRI imaging.  I think that private vehicle transport is appropriate in this setting.  Her family member will drive her to the [facility], ER for MRI.  Dr. Emil accepting.   Considered admission  but will follow MRI.   Patient's presentation is most consistent with acute presentation with potential threat to life or bodily function.   Disposition: transfer  ____________________________________________  FINAL CLINICAL IMPRESSION(S) / ED DIAGNOSES  Final diagnoses:  Numbness  Precordial chest pain   Note:  This document was prepared using Dragon voice recognition software and may include unintentional dictation errors.  Fonda Law, MD, [facility]    Sohana Austell, Fonda MATSU, MD 05/04/24 [phone]

## 2024-05-04 NOTE — ED Notes (Signed)
 Pt left to go to [facility] with her friend understands to go straight there without stopping with eating and to keep IV secured Pt [id number] anbulatory

## 2024-05-08 DIAGNOSIS — K080 Exfoliation of teeth due to systemic causes: Secondary | ICD-10-CM | POA: Diagnosis not present

## 2024-05-17 ENCOUNTER — Telehealth: Payer: Self-pay

## 2024-05-17 NOTE — Telephone Encounter (Signed)
 Called pt to get her scheduled for an ED fu within 2 weeks per pcp but received no answer. Left vm for her to call back and schedule. Will also notify her via mychart

## 2024-05-23 DIAGNOSIS — H31013 Macula scars of posterior pole (postinflammatory) (post-traumatic), bilateral: Secondary | ICD-10-CM | POA: Diagnosis not present

## 2024-05-23 DIAGNOSIS — H04123 Dry eye syndrome of bilateral lacrimal glands: Secondary | ICD-10-CM | POA: Diagnosis not present

## 2024-05-23 DIAGNOSIS — H40013 Open angle with borderline findings, low risk, bilateral: Secondary | ICD-10-CM | POA: Diagnosis not present

## 2024-05-23 DIAGNOSIS — H25813 Combined forms of age-related cataract, bilateral: Secondary | ICD-10-CM | POA: Diagnosis not present

## 2024-05-23 LAB — OPHTHALMOLOGY REPORT-SCANNED

## 2024-05-25 DIAGNOSIS — H524 Presbyopia: Secondary | ICD-10-CM | POA: Diagnosis not present

## 2024-07-04 ENCOUNTER — Encounter: Payer: Self-pay | Admitting: Medical

## 2024-07-11 ENCOUNTER — Telehealth: Payer: Self-pay | Admitting: Pharmacy Technician

## 2024-07-11 ENCOUNTER — Ambulatory Visit (INDEPENDENT_AMBULATORY_CARE_PROVIDER_SITE_OTHER): Admitting: Medical

## 2024-07-11 ENCOUNTER — Other Ambulatory Visit (HOSPITAL_COMMUNITY): Payer: Self-pay

## 2024-07-11 VITALS — BP 118/90 | HR 88 | Temp 98.2°F | Resp 15 | Ht 61.0 in | Wt 133.4 lb

## 2024-07-11 DIAGNOSIS — E041 Nontoxic single thyroid nodule: Secondary | ICD-10-CM

## 2024-07-11 DIAGNOSIS — S161XXD Strain of muscle, fascia and tendon at neck level, subsequent encounter: Secondary | ICD-10-CM

## 2024-07-11 DIAGNOSIS — H9203 Otalgia, bilateral: Secondary | ICD-10-CM

## 2024-07-11 DIAGNOSIS — R591 Generalized enlarged lymph nodes: Secondary | ICD-10-CM | POA: Diagnosis not present

## 2024-07-11 MED ORDER — AZITHROMYCIN 250 MG PO TABS: ORAL_TABLET | ORAL | 0 refills | Status: AC

## 2024-07-11 MED ORDER — AZITHROMYCIN 250 MG PO TABS: ORAL_TABLET | ORAL | 0 refills | Status: DC

## 2024-07-11 NOTE — Telephone Encounter (Signed)
 Pharmacy Patient Advocate Encounter   Received notification from Cover[employer]Meds that prior authorization for repatha  is required/requested.   Insurance verification completed.   The patient is insured through [employer].   Per test claim: PA required; PA submitted to above mentioned insurance via Latent Key/confirmation #/EOC [id number] Status is pending

## 2024-07-11 NOTE — Patient Instructions (Signed)
 Otalgia and submandibular pain, possible infection versus neck muscle strain sternocleidomastoid after using support pillow. Intermittent otalgia and submandibular pain for four months, primarily nocturnal. Right tympanic membrane dull. Differential includes infection and neck muscle strain, possibly due to pillow affecting sternocleidomastoid muscle. Considered lymph node involvement. - Ordered CBC to evaluate for infection. - Prescribed azithromycin  for five days. - Advised trial of discontinuing the pillow for one week. - Scheduled follow-up in ten days.  Thyroid  nodule Identified on previous MRI, measuring 2.7 cm. No immediate concerns, but further evaluation recommended. - Ordered thyroid  ultrasound.  Cervical spondylosis with spinal stenosis Multilevel cervical spondylosis and diffuse spinal stenosis from C3 to C6. Symptoms include left upper extremity numbness, improved with specific pillow. No surgical intervention pursued. - Will consider referral to neurosurgeon if symptoms worsen or if pain radiates down the arm.  Follow up in 10 days or sooner if needed

## 2024-07-11 NOTE — Progress Notes (Signed)
" ° °  Subjective:    Patient ID: Patty [name], female    DOB: 01-13-1956, 69 y.o.   MRN: [id number]  HPI Patty [name] is a 69 year old female who presents with ear pain and numbness in the left arm.  She has had pain inside and below the left ear and rt ear for four months, worse at night and less noticeable during the day, described as a prolonged ache with greater intensity on the left. She denies nasal congestion, throat pain, dental pain, stomach pain, or nasal symptoms, though she sometimes notes a squishy throat sensation.  She previously went to the emergency department and had an MRI of the neck and head and was told she has disc problems in the neck. She declined neurosurgical evaluation and instead pursued chiropractic care and acupuncture. She uses a special pillow to prevent numbness in the left arm that occurs when she sleeps without it. The pillow improves the arm numbness but may worsen neck pain.  She thnks maybe the pillow used for posterior neck pain and left arm pain may have cause pain in upper sternocleidomastoid muscle regions.   She was told that a thyroid  nodule was seen on the MRI, but there has been no further evaluation. She has used Robaxin , [place] in the past.   Review of Systems See hpi    Objective:   Physical Exam   General Mental Status- Alert. General Appearance- Not in acute distress.   Skin General: Color- Normal Color. Moisture- Normal Moisture.  Neck No JVD.  Chest and Lung Exam Auscultation: Breath Sounds:-CTA  Cardiovascular Auscultation:Rythm- RRR Murmurs & Other Heart Sounds:Auscultation of the heart reveals- No Murmurs.  Abdomen Inspection:-Inspeection Normal. Palpation/Percussion:Note:No mass. Palpation and Percussion of the abdomen reveal- Non Tender, Non Distended + BS, no rebound or guarding.   Neurologic Cranial Nerve exam:- CN III-XII intact(No nystagmus), symmetric smile. Strength:- 5/5  equal and symmetric strength both upper and lower extremities.    Heent- no sinus pressure. Canals clear. Left tm normal. Rt tm mild dull. Posterior pharynx normal. Both submandibular region mild tender and midl enlarge lymph nods. Upper sternocleidomastoid mild tender.    Assessment & Plan:   Otalgia and submandibular pain, possible infection versus neck muscle strain sternocleidomastoid after using support pillow. Intermittent otalgia and submandibular pain for four months, primarily nocturnal. Right tympanic membrane dull. Differential includes infection and neck muscle strain, possibly due to pillow affecting sternocleidomastoid muscle. Considered lymph node involvement. - Ordered CBC to evaluate for infection. - Prescribed azithromycin  for five days. - Advised trial of discontinuing the pillow for one week. - Scheduled follow-up in ten days.  Thyroid  nodule Identified on previous MRI, measuring 2.7 cm. No immediate concerns, but further evaluation recommended. - Ordered thyroid  ultrasound.  Cervical spondylosis with spinal stenosis Multilevel cervical spondylosis and diffuse spinal stenosis from C3 to C6. Symptoms include left upper extremity numbness, improved with specific pillow. No surgical intervention pursued. - Will consider referral to neurosurgeon if symptoms worsen or if pain radiates down the arm.  Follow up in 10 days or sooner if needed "

## 2024-07-12 LAB — CBC WITH DIFFERENTIAL/PLATELET
Basophils Absolute: 0.1 K/uL (ref 0.0–0.1)
Basophils Relative: 1 % (ref 0.0–3.0)
Eosinophils Absolute: 0.2 K/uL (ref 0.0–0.7)
Eosinophils Relative: 2.3 % (ref 0.0–5.0)
HCT: 41.9 % (ref 36.0–46.0)
Hemoglobin: 14 g/dL (ref 12.0–15.0)
Lymphocytes Relative: 29.1 % (ref 12.0–46.0)
Lymphs Abs: 2.3 K/uL (ref 0.7–4.0)
MCHC: 33.4 g/dL (ref 30.0–36.0)
MCV: 88.9 fl (ref 78.0–100.0)
Monocytes Absolute: 0.6 K/uL (ref 0.1–1.0)
Monocytes Relative: 8.4 % (ref 3.0–12.0)
Neutro Abs: 4.6 K/uL (ref 1.4–7.7)
Neutrophils Relative %: 59.2 % (ref 43.0–77.0)
Platelets: 373 K/uL (ref 150.0–400.0)
RBC: 4.72 Mil/uL (ref 3.87–5.11)
RDW: 12.7 % (ref 11.5–15.5)
WBC: 7.7 K/uL (ref 4.0–10.5)

## 2024-07-12 NOTE — Telephone Encounter (Signed)
 Pharmacy Patient Advocate Encounter  Received notification from [facility] medicare that Prior Authorization for repatha  has been APPROVED from 07/11/24 to 07/11/25   PA #/Case ID/Reference #: [id number]

## 2024-07-14 ENCOUNTER — Ambulatory Visit: Payer: Self-pay | Admitting: Medical

## 2024-07-15 ENCOUNTER — Other Ambulatory Visit: Payer: Self-pay | Admitting: Medical

## 2024-07-15 DIAGNOSIS — E781 Pure hyperglyceridemia: Secondary | ICD-10-CM

## 2024-07-21 ENCOUNTER — Ambulatory Visit (HOSPITAL_BASED_OUTPATIENT_CLINIC_OR_DEPARTMENT_OTHER)
Admission: RE | Admit: 2024-07-21 | Discharge: 2024-07-21 | Disposition: A | Discharge status: Home / Self Care | Source: Ambulatory Visit | Attending: Medical | Admitting: Medical

## 2024-07-21 DIAGNOSIS — E041 Nontoxic single thyroid nodule: Secondary | ICD-10-CM | POA: Insufficient documentation

## 2025-03-14 ENCOUNTER — Ambulatory Visit
